# Patient Record
Sex: Male | Born: 1955 | Race: White | Hispanic: No | Marital: Married | Smoking: Former smoker
Health system: Southern US, Community
[De-identification: ages and names within clinical notes are randomized; demographics above are authoritative.]

## PROBLEM LIST (undated history)

## (undated) DIAGNOSIS — E669 Obesity, unspecified: Secondary | ICD-10-CM

## (undated) DIAGNOSIS — R32 Unspecified urinary incontinence: Secondary | ICD-10-CM

## (undated) DIAGNOSIS — F329 Major depressive disorder, single episode, unspecified: Secondary | ICD-10-CM

## (undated) DIAGNOSIS — R1310 Dysphagia, unspecified: Secondary | ICD-10-CM

## (undated) DIAGNOSIS — F419 Anxiety disorder, unspecified: Secondary | ICD-10-CM

## (undated) DIAGNOSIS — K5790 Diverticulosis of intestine, part unspecified, without perforation or abscess without bleeding: Secondary | ICD-10-CM

## (undated) DIAGNOSIS — I10 Essential (primary) hypertension: Secondary | ICD-10-CM

## (undated) DIAGNOSIS — K2270 Barrett's esophagus without dysplasia: Secondary | ICD-10-CM

## (undated) DIAGNOSIS — R079 Chest pain, unspecified: Secondary | ICD-10-CM

## (undated) DIAGNOSIS — E079 Disorder of thyroid, unspecified: Secondary | ICD-10-CM

## (undated) DIAGNOSIS — G2571 Drug induced akathisia: Secondary | ICD-10-CM

## (undated) DIAGNOSIS — M180 Bilateral primary osteoarthritis of first carpometacarpal joints: Secondary | ICD-10-CM

## (undated) DIAGNOSIS — T7840XA Allergy, unspecified, initial encounter: Secondary | ICD-10-CM

## (undated) DIAGNOSIS — D649 Anemia, unspecified: Secondary | ICD-10-CM

## (undated) DIAGNOSIS — K635 Polyp of colon: Secondary | ICD-10-CM

## (undated) DIAGNOSIS — F32A Depression, unspecified: Secondary | ICD-10-CM

## (undated) DIAGNOSIS — N3281 Overactive bladder: Secondary | ICD-10-CM

## (undated) DIAGNOSIS — R609 Edema, unspecified: Secondary | ICD-10-CM

## (undated) DIAGNOSIS — E041 Nontoxic single thyroid nodule: Secondary | ICD-10-CM

## (undated) DIAGNOSIS — K449 Diaphragmatic hernia without obstruction or gangrene: Secondary | ICD-10-CM

## (undated) DIAGNOSIS — G2401 Drug induced subacute dyskinesia: Secondary | ICD-10-CM

## (undated) DIAGNOSIS — I872 Venous insufficiency (chronic) (peripheral): Secondary | ICD-10-CM

## (undated) DIAGNOSIS — F988 Other specified behavioral and emotional disorders with onset usually occurring in childhood and adolescence: Secondary | ICD-10-CM

## (undated) DIAGNOSIS — N280 Ischemia and infarction of kidney: Secondary | ICD-10-CM

## (undated) DIAGNOSIS — K219 Gastro-esophageal reflux disease without esophagitis: Secondary | ICD-10-CM

## (undated) HISTORY — DX: Unspecified urinary incontinence: R32

## (undated) HISTORY — DX: Diverticulosis of intestine, part unspecified, without perforation or abscess without bleeding: K57.90

## (undated) HISTORY — DX: Polyp of colon: K63.5

## (undated) HISTORY — DX: Edema, unspecified: R60.9

## (undated) HISTORY — DX: Other specified behavioral and emotional disorders with onset usually occurring in childhood and adolescence: F98.8

## (undated) HISTORY — DX: Overactive bladder: N32.81

## (undated) HISTORY — DX: Barrett's esophagus without dysplasia: K22.70

## (undated) HISTORY — DX: Disorder of thyroid, unspecified: E07.9

## (undated) HISTORY — DX: Dysphagia, unspecified: R13.10

## (undated) HISTORY — DX: Bilateral primary osteoarthritis of first carpometacarpal joints: M18.0

## (undated) HISTORY — DX: Gastro-esophageal reflux disease without esophagitis: K21.9

## (undated) HISTORY — DX: Drug induced akathisia: G25.71

## (undated) HISTORY — DX: Anemia, unspecified: D64.9

## (undated) HISTORY — DX: Obesity, unspecified: E66.9

## (undated) HISTORY — DX: Anxiety disorder, unspecified: F41.9

## (undated) HISTORY — DX: Drug induced subacute dyskinesia: G24.01

## (undated) HISTORY — DX: Chest pain, unspecified: R07.9

## (undated) HISTORY — DX: Depression, unspecified: F32.A

## (undated) HISTORY — DX: Allergy, unspecified, initial encounter: T78.40XA

## (undated) HISTORY — DX: Major depressive disorder, single episode, unspecified: F32.9

## (undated) HISTORY — DX: Diaphragmatic hernia without obstruction or gangrene: K44.9

## (undated) HISTORY — DX: Venous insufficiency (chronic) (peripheral): I87.2

## (undated) HISTORY — DX: Nontoxic single thyroid nodule: E04.1

## (undated) HISTORY — DX: Ischemia and infarction of kidney: N28.0

---
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
## 1980-06-14 HISTORY — PX: INGUINAL HERNIA REPAIR: SUR1180

## 1996-06-14 HISTORY — PX: NERVE AND TENDON REPAIR: SHX5693

## 1997-06-14 HISTORY — PX: LUMBAR DISC SURGERY: SHX700

## 1998-06-04 ENCOUNTER — Inpatient Hospital Stay (HOSPITAL_COMMUNITY): Admission: RE | Admit: 1998-06-04 | Discharge: 1998-06-06 | Payer: Self-pay | Admitting: Neurosurgery

## 1998-06-04 ENCOUNTER — Encounter: Payer: Self-pay | Admitting: Neurosurgery

## 1998-07-02 ENCOUNTER — Encounter: Payer: Self-pay | Admitting: Neurosurgery

## 1998-07-02 ENCOUNTER — Ambulatory Visit (HOSPITAL_COMMUNITY): Admission: RE | Admit: 1998-07-02 | Discharge: 1998-07-02 | Payer: Self-pay | Admitting: Neurosurgery

## 1998-10-03 ENCOUNTER — Ambulatory Visit (HOSPITAL_COMMUNITY): Admission: RE | Admit: 1998-10-03 | Discharge: 1998-10-03 | Payer: Self-pay | Admitting: Neurosurgery

## 1998-10-03 ENCOUNTER — Encounter: Payer: Self-pay | Admitting: Neurosurgery

## 2000-06-14 HISTORY — PX: LAPAROSCOPIC CHOLECYSTECTOMY: SUR755

## 2013-09-18 ENCOUNTER — Ambulatory Visit: Payer: Self-pay | Admitting: Cardiovascular Disease

## 2016-06-09 ENCOUNTER — Encounter: Payer: Self-pay | Admitting: Emergency Medicine

## 2016-06-09 ENCOUNTER — Emergency Department
Admission: EM | Admit: 2016-06-09 | Discharge: 2016-06-09 | Disposition: A | Discharge status: Home / Self Care | Payer: Medicaid Other | Attending: Emergency Medicine | Admitting: Emergency Medicine

## 2016-06-09 ENCOUNTER — Emergency Department: Payer: Medicaid Other

## 2016-06-09 DIAGNOSIS — F17200 Nicotine dependence, unspecified, uncomplicated: Secondary | ICD-10-CM | POA: Insufficient documentation

## 2016-06-09 DIAGNOSIS — W01190A Fall on same level from slipping, tripping and stumbling with subsequent striking against furniture, initial encounter: Secondary | ICD-10-CM | POA: Diagnosis not present

## 2016-06-09 DIAGNOSIS — Y999 Unspecified external cause status: Secondary | ICD-10-CM | POA: Diagnosis not present

## 2016-06-09 DIAGNOSIS — I10 Essential (primary) hypertension: Secondary | ICD-10-CM | POA: Diagnosis not present

## 2016-06-09 DIAGNOSIS — S8012XA Contusion of left lower leg, initial encounter: Secondary | ICD-10-CM | POA: Diagnosis not present

## 2016-06-09 DIAGNOSIS — Y92009 Unspecified place in unspecified non-institutional (private) residence as the place of occurrence of the external cause: Secondary | ICD-10-CM | POA: Diagnosis not present

## 2016-06-09 DIAGNOSIS — Y939 Activity, unspecified: Secondary | ICD-10-CM | POA: Diagnosis not present

## 2016-06-09 DIAGNOSIS — Z79899 Other long term (current) drug therapy: Secondary | ICD-10-CM | POA: Insufficient documentation

## 2016-06-09 DIAGNOSIS — T148XXA Other injury of unspecified body region, initial encounter: Secondary | ICD-10-CM

## 2016-06-09 DIAGNOSIS — Z7982 Long term (current) use of aspirin: Secondary | ICD-10-CM | POA: Insufficient documentation

## 2016-06-09 DIAGNOSIS — S8992XA Unspecified injury of left lower leg, initial encounter: Secondary | ICD-10-CM | POA: Diagnosis present

## 2016-06-09 HISTORY — DX: Essential (primary) hypertension: I10

## 2016-06-09 MED ORDER — OXYCODONE-ACETAMINOPHEN 5-325 MG PO TABS
1.0000 | ORAL_TABLET | Freq: Once | ORAL | Status: AC
  Administered 2016-06-09: 1 via ORAL
  Filled 2016-06-09: qty 1

## 2016-06-09 MED ORDER — BACITRACIN ZINC 500 UNIT/GM EX OINT
TOPICAL_OINTMENT | Freq: Once | CUTANEOUS | Status: AC
  Administered 2016-06-09: 1 via TOPICAL
  Filled 2016-06-09: qty 0.9

## 2016-06-09 MED ORDER — NAPROXEN 500 MG PO TBEC: 500.0000 mg | DELAYED_RELEASE_TABLET | Freq: Two times a day (BID) | ORAL | 0 refills | Status: DC

## 2016-06-09 MED ORDER — HYDROCODONE-ACETAMINOPHEN 5-325 MG PO TABS: 1.0000 | ORAL_TABLET | Freq: Three times a day (TID) | ORAL | 0 refills | Status: DC | PRN

## 2016-06-09 NOTE — ED Provider Notes (Signed)
[facility] Emergency Department Provider Note ____________________________________________  Time seen: [id number]  I have reviewed the triage vital signs and the nursing notes.  HISTORY  Chief Complaint  Fall and Leg Injury  HPI Jared Carey is a 60 y.o. male resistance to the ED accompanied by his wife for evaluation of injury sustained following a fall at home. He describes slipping at home, and when he fell [place] hit the side of his coffee table. He sustained a small abrasion to the upper leg, since that time has had bruising, swelling, and tightness. He denies any knee injury, ankle injury, or head injury. He reports pain to the leg but denies any chest pain or distal paresthesias. He dose aspirin this morning for pain relief.   Past Medical History:  Diagnosis Date  . ADD (attention deficit disorder)   . Anemia   . Anxiety   . Depression   . Hyperlipidemia   . Hypertension   . Incontinence    urge  . Major depressive disorder   . Obesity   . Obesity   . Renal infarction (HCC)   . Venous insufficiency     There are no active problems to display for this patient.   Past Surgical History:  Procedure Laterality Date  . LAPAROSCOPIC CHOLECYSTECTOMY    . LUMBAR DISC SURGERY     L5-S1     Prior to Admission medications   Medication Sig Start Date End Date Taking? Authorizing Provider  amLODipine (NORVASC) 10 MG tablet Take 10 mg by mouth daily.   Yes Historical Provider, MD  aspirin 325 MG tablet Take 975 mg by mouth every 6 (six) hours as needed.   Yes Historical Provider, MD  pantoprazole (PROTONIX) 40 MG tablet Take 40 mg by mouth daily.   Yes Historical Provider, MD  albuterol (PROVENTIL HFA;VENTOLIN HFA) 108 (90 BASE) MCG/ACT inhaler Inhale 2 puffs into the lungs every 6 (six) hours as needed for wheezing or shortness of breath.    Historical Provider, MD  fluticasone (FLOVENT DISKUS) 50 MCG/BLIST diskus inhaler Inhale 1 puff into the lungs  daily.    Historical Provider, MD  methylphenidate (METADATE ER) 20 MG ER tablet Take 20 mg by mouth daily.    Historical Provider, MD  oxybutynin (DITROPAN-XL) 10 MG 24 hr tablet Take 20 mg by mouth 2 (two) times daily.    Historical Provider, MD  pravastatin (PRAVACHOL) 80 MG tablet Take 80 mg by mouth daily.    Historical Provider, MD  propranolol (INDERAL) 40 MG tablet Take 40 mg by mouth 2 (two) times daily.    Historical Provider, MD  Venlafaxine HCl 225 MG TB24 Take 225 mg by mouth daily.    Historical Provider, MD  venlafaxine XR (EFFEXOR-XR) 150 MG 24 hr capsule Take 150 mg by mouth daily with breakfast.    Historical Provider, MD    Allergies Benadryl [diphenhydramine hcl (sleep)] and Simvastatin  No family history on file.  Social History Social History  Substance Use Topics  . Smoking status: Current Every Day Smoker  . Smokeless tobacco: Never Used  . Alcohol use No    Review of Systems  Constitutional: Negative for fever. Cardiovascular: Negative for chest pain. Respiratory: Negative for shortness of breath. Gastrointestinal: Negative for abdominal pain, vomiting and diarrhea. Musculoskeletal: Negative for back pain. LLE pain & swelling as above Skin: Negative for rash. Neurological: Negative for headaches, focal weakness or numbness. ____________________________________________  PHYSICAL EXAM:  VITAL SIGNS: ED Triage Vitals  Enc Vitals Group     BP 06/09/16 1324 (!) 150/96     Pulse Rate 06/09/16 1324 67     Resp 06/09/16 1324 18     Temp 06/09/16 1324 97.8 F (36.6 C)     Temp Source 06/09/16 1324 Oral     SpO2 06/09/16 1324 98 %     Weight 06/09/16 1324 300 lb (136.1 kg)     Height 06/09/16 1324 6\' 2"  (1.88 m)     Head Circumference --      Peak Flow --      Pain Score 06/09/16 1330 8     Pain Loc --      Pain Edu? --      Excl. in GC? --    Constitutional: Alert and oriented. Well appearing and in no distress. Head: Normocephalic and  atraumatic. Cardiovascular: Normal rate, regular rhythm. Normal distal pulses. Respiratory: Normal respiratory effort. No wheezes/rales/rhonchi. Musculoskeletal: LLE with local contusion, swelling and a moderate hematoma noted. Left calf measures 45 cm in diameter. Normal ankle and knee exam. Nontender with normal range of motion in all extremities.  Neurologic:  Antalgic gait without ataxia. Normal speech and language. No gross focal neurologic deficits are appreciated. Skin:  Skin is warm, dry and intact. No rash noted. Small Abrasion with serous weeping noted to the lateral left calf. Early ecchymosis noted to the left calf.  ____________________________________________   RADIOLOGY  Left Tibia/Fibula  Negative  I, Krisi Azua, Charlesetta IvoryJenise V Bacon, personally viewed and evaluated these images (plain radiographs) as part of my medical decision making, as well as reviewing the written report by the radiologist. ____________________________________________  PROCEDURES  Percocet 5-325 mg PO Bacitracin ointment Dressing ____________________________________________  INITIAL IMPRESSION / ASSESSMENT AND PLAN / ED COURSE  Patient with a mechanical fall resulting in contusion to the left lateral calf. He sustained a moderate hematoma and superficial abrasion. Radiology is negative for any acute fracture or dislocation of the lower leg. Patient is given instructions on management of a hematoma. The superficial wound is cleansed and dressed with antibiotic ointment and nonstick dressing. Patient will continue to rest, ice, elevate the lower leg. He should follow his primary care provider for ongoing symptom management.  Clinical Course    ____________________________________________  FINAL CLINICAL IMPRESSION(S) / ED DIAGNOSES  Final diagnoses:  Hematoma and contusion  Contusion of left leg, initial encounter      Lissa HoardJenise V Bacon Audon Heymann, PA-C 06/09/16 1551    Charlesetta IvoryJenise V Bacon [place] Paris[place]enshew,  PA-C 06/09/16 1613    Minna AntisKevin Paduchowski, MD 06/09/16 2022

## 2016-06-09 NOTE — ED Triage Notes (Signed)
States he tripped   Hit left lower leg on coffee table  Bruising and swelling noted to lower leg

## 2016-06-09 NOTE — Discharge Instructions (Signed)
Your exam and x-ray are negative for fracture or dislocation of the lower leg, but you do have a large hematoma and bruising. There is no indication on exam of a blood clot to the lower leg. You will experience muscle pain and tightness for a few days. Rest with the leg elevated and apply ice packs to reduce pain and swelling. Keep the wound clean, dry, and covered with antibiotic ointment. Take the pain medicine and anti-inflammatory as directed.

## 2016-06-14 HISTORY — PX: BIOPSY THYROID: PRO38

## 2016-08-06 ENCOUNTER — Encounter: Payer: Self-pay | Admitting: Emergency Medicine

## 2016-08-06 ENCOUNTER — Emergency Department: Payer: Medicaid Other

## 2016-08-06 ENCOUNTER — Observation Stay
Admission: EM | Admit: 2016-08-06 | Discharge: 2016-08-09 | Disposition: A | Discharge status: Home / Self Care | Payer: Medicaid Other | Attending: Internal Medicine | Admitting: Internal Medicine

## 2016-08-06 DIAGNOSIS — Z7982 Long term (current) use of aspirin: Secondary | ICD-10-CM | POA: Diagnosis not present

## 2016-08-06 DIAGNOSIS — F419 Anxiety disorder, unspecified: Secondary | ICD-10-CM | POA: Diagnosis not present

## 2016-08-06 DIAGNOSIS — K760 Fatty (change of) liver, not elsewhere classified: Secondary | ICD-10-CM | POA: Insufficient documentation

## 2016-08-06 DIAGNOSIS — I119 Hypertensive heart disease without heart failure: Secondary | ICD-10-CM | POA: Diagnosis not present

## 2016-08-06 DIAGNOSIS — I2510 Atherosclerotic heart disease of native coronary artery without angina pectoris: Secondary | ICD-10-CM | POA: Diagnosis not present

## 2016-08-06 DIAGNOSIS — Z79899 Other long term (current) drug therapy: Secondary | ICD-10-CM | POA: Diagnosis not present

## 2016-08-06 DIAGNOSIS — F988 Other specified behavioral and emotional disorders with onset usually occurring in childhood and adolescence: Secondary | ICD-10-CM | POA: Insufficient documentation

## 2016-08-06 DIAGNOSIS — N3941 Urge incontinence: Secondary | ICD-10-CM | POA: Diagnosis not present

## 2016-08-06 DIAGNOSIS — K56609 Unspecified intestinal obstruction, unspecified as to partial versus complete obstruction: Secondary | ICD-10-CM

## 2016-08-06 DIAGNOSIS — K56600 Partial intestinal obstruction, unspecified as to cause: Secondary | ICD-10-CM | POA: Insufficient documentation

## 2016-08-06 DIAGNOSIS — I10 Essential (primary) hypertension: Secondary | ICD-10-CM | POA: Diagnosis present

## 2016-08-06 DIAGNOSIS — Z6838 Body mass index (BMI) 38.0-38.9, adult: Secondary | ICD-10-CM | POA: Diagnosis not present

## 2016-08-06 DIAGNOSIS — E669 Obesity, unspecified: Secondary | ICD-10-CM | POA: Insufficient documentation

## 2016-08-06 DIAGNOSIS — Z9049 Acquired absence of other specified parts of digestive tract: Secondary | ICD-10-CM | POA: Insufficient documentation

## 2016-08-06 DIAGNOSIS — E785 Hyperlipidemia, unspecified: Secondary | ICD-10-CM | POA: Insufficient documentation

## 2016-08-06 DIAGNOSIS — I081 Rheumatic disorders of both mitral and tricuspid valves: Secondary | ICD-10-CM | POA: Insufficient documentation

## 2016-08-06 DIAGNOSIS — K2100 Gastro-esophageal reflux disease with esophagitis, without bleeding: Secondary | ICD-10-CM | POA: Diagnosis present

## 2016-08-06 DIAGNOSIS — K2270 Barrett's esophagus without dysplasia: Secondary | ICD-10-CM | POA: Diagnosis not present

## 2016-08-06 DIAGNOSIS — R109 Unspecified abdominal pain: Secondary | ICD-10-CM

## 2016-08-06 DIAGNOSIS — Z888 Allergy status to other drugs, medicaments and biological substances status: Secondary | ICD-10-CM | POA: Insufficient documentation

## 2016-08-06 DIAGNOSIS — F32A Depression, unspecified: Secondary | ICD-10-CM | POA: Diagnosis present

## 2016-08-06 DIAGNOSIS — Z87891 Personal history of nicotine dependence: Secondary | ICD-10-CM | POA: Insufficient documentation

## 2016-08-06 DIAGNOSIS — E041 Nontoxic single thyroid nodule: Secondary | ICD-10-CM | POA: Insufficient documentation

## 2016-08-06 DIAGNOSIS — K439 Ventral hernia without obstruction or gangrene: Secondary | ICD-10-CM | POA: Insufficient documentation

## 2016-08-06 DIAGNOSIS — D649 Anemia, unspecified: Secondary | ICD-10-CM | POA: Insufficient documentation

## 2016-08-06 DIAGNOSIS — K5730 Diverticulosis of large intestine without perforation or abscess without bleeding: Secondary | ICD-10-CM | POA: Diagnosis not present

## 2016-08-06 DIAGNOSIS — K219 Gastro-esophageal reflux disease without esophagitis: Secondary | ICD-10-CM | POA: Diagnosis not present

## 2016-08-06 DIAGNOSIS — F329 Major depressive disorder, single episode, unspecified: Secondary | ICD-10-CM | POA: Diagnosis not present

## 2016-08-06 DIAGNOSIS — R079 Chest pain, unspecified: Secondary | ICD-10-CM | POA: Diagnosis not present

## 2016-08-06 DIAGNOSIS — I872 Venous insufficiency (chronic) (peripheral): Secondary | ICD-10-CM | POA: Insufficient documentation

## 2016-08-06 LAB — CBC
HEMATOCRIT: 46.4 % (ref 40.0–52.0)
HEMOGLOBIN: 15.7 g/dL (ref 13.0–18.0)
MCH: 29.3 pg (ref 26.0–34.0)
MCHC: 33.9 g/dL (ref 32.0–36.0)
MCV: 86.6 fL (ref 80.0–100.0)
PLATELETS: 316 10*3/uL (ref 150–440)
RBC: 5.37 MIL/uL (ref 4.40–5.90)
RDW: 13.7 % (ref 11.5–14.5)
WBC: 9 10*3/uL (ref 3.8–10.6)

## 2016-08-06 LAB — BASIC METABOLIC PANEL
ANION GAP: 10 (ref 5–15)
BUN: 27 mg/dL — ABNORMAL HIGH (ref 6–20)
CHLORIDE: 100 mmol/L — AB (ref 101–111)
CO2: 24 mmol/L (ref 22–32)
CREATININE: 1.49 mg/dL — AB (ref 0.61–1.24)
Calcium: 8.3 mg/dL — ABNORMAL LOW (ref 8.9–10.3)
GFR calc non Af Amer: 49 mL/min — ABNORMAL LOW (ref >60)
GFR, EST AFRICAN AMERICAN: 57 mL/min — AB (ref >60)
Glucose, Bld: 121 mg/dL — ABNORMAL HIGH (ref 65–99)
Potassium: 3.8 mmol/L (ref 3.5–5.1)
SODIUM: 134 mmol/L — AB (ref 135–145)

## 2016-08-06 LAB — HEPATIC FUNCTION PANEL
ALBUMIN: 3.9 g/dL (ref 3.5–5.0)
ALT: 14 U/L — AB (ref 17–63)
AST: 22 U/L (ref 15–41)
Alkaline Phosphatase: 66 U/L (ref 38–126)
BILIRUBIN TOTAL: 0.8 mg/dL (ref 0.3–1.2)
Bilirubin, Direct: 0.1 mg/dL (ref 0.1–0.5)
Indirect Bilirubin: 0.7 mg/dL (ref 0.3–0.9)
Total Protein: 7.5 g/dL (ref 6.5–8.1)

## 2016-08-06 LAB — TROPONIN I: Troponin I: <0.03 ng/mL (ref <0.03)

## 2016-08-06 MED ORDER — GI COCKTAIL ~~LOC~~
ORAL | Status: AC
  Administered 2016-08-06: 30 mL via ORAL
  Filled 2016-08-06: qty 30

## 2016-08-06 MED ORDER — GI COCKTAIL ~~LOC~~
30.0000 mL | Freq: Once | ORAL | Status: AC
  Administered 2016-08-06: 30 mL via ORAL

## 2016-08-06 NOTE — ED Provider Notes (Signed)
[facility] Emergency Department Provider Note    First MD Initiated Contact with Patient 08/06/16 2056     (approximate)  I have reviewed the triage vital signs and the nursing notes.   HISTORY  Chief Complaint Chest Pain    HPI AMEYA KUTZ is a 61 y.o. male who presents with chief complaint of midsternal crushing chest pain with radiation to his right shoulder and jaw that occurred around 7:30 PM while sitting for dinner. Patient became diaphoretic and short of breath. States that pain was waxing and waning between a 3 out of 1010 in severity. Last roughly 45 minutes. Was still having some aching chest discomfort while in the ER but it resolved when patient was going to a chest x-ray was ordered in triage. States that he and his wife had recently had the flu "bug". Today was his first day back eating and drinking. Did not have any nausea or vomiting with this episode. States he has a history of GERD and Barrett's esophagus but this feels different from episodes of that. Denies any lower extremity swelling. Denies any known history of heart disease. Had a cardiac catheter at the [place] roughly 28 years ago that was reportedly "clean". He does take a daily aspirin.   Past Medical History:  Diagnosis Date  . ADD (attention deficit disorder)   . Anemia   . Anxiety   . Depression   . Hyperlipidemia   . Hypertension   . Incontinence    urge  . Major depressive disorder   . Obesity   . Obesity   . Renal infarction (HCC)   . Venous insufficiency    History reviewed. No pertinent family history. Past Surgical History:  Procedure Laterality Date  . LAPAROSCOPIC CHOLECYSTECTOMY    . LUMBAR DISC SURGERY     L5-S1    There are no active problems to display for this patient.     Prior to Admission medications   Medication Sig Start Date End Date Taking? Authorizing Provider  albuterol (PROVENTIL HFA;VENTOLIN HFA) 108 (90 BASE) MCG/ACT inhaler Inhale 2  puffs into the lungs every 6 (six) hours as needed for wheezing or shortness of breath.    Historical Provider, MD  amLODipine (NORVASC) 10 MG tablet Take 10 mg by mouth daily.    Historical Provider, MD  aspirin 325 MG tablet Take 975 mg by mouth every 6 (six) hours as needed.    Historical Provider, MD  fluticasone (FLOVENT DISKUS) 50 MCG/BLIST diskus inhaler Inhale 1 puff into the lungs daily.    Historical Provider, MD  HYDROcodone-acetaminophen (NORCO) 5-325 MG tablet Take 1 tablet by mouth 3 (three) times daily as needed. 06/09/16   Jenise V Bacon Menshew, PA-C  methylphenidate (METADATE ER) 20 MG ER tablet Take 20 mg by mouth daily.    Historical Provider, MD  naproxen (EC NAPROSYN) 500 MG EC tablet Take 1 tablet (500 mg total) by mouth 2 (two) times daily with a meal. 06/09/16   Jenise V Bacon Menshew, PA-C  oxybutynin (DITROPAN-XL) 10 MG 24 hr tablet Take 20 mg by mouth 2 (two) times daily.    Historical Provider, MD  pantoprazole (PROTONIX) 40 MG tablet Take 40 mg by mouth daily.    Historical Provider, MD  pravastatin (PRAVACHOL) 80 MG tablet Take 80 mg by mouth daily.    Historical Provider, MD  propranolol (INDERAL) 40 MG tablet Take 40 mg by mouth 2 (two) times daily.    Historical Provider, MD  Venlafaxine HCl 225 MG TB24 Take 225 mg by mouth daily.    Historical Provider, MD  venlafaxine XR (EFFEXOR-XR) 150 MG 24 hr capsule Take 150 mg by mouth daily with breakfast.    Historical Provider, MD    Allergies Benadryl [diphenhydramine hcl (sleep)] and Simvastatin    Social History Social History  Substance Use Topics  . Smoking status: Former Games developermoker  . Smokeless tobacco: Never Used  . Alcohol use No    Review of Systems Patient denies headaches, rhinorrhea, blurry vision, numbness, shortness of breath, chest pain, edema, cough, abdominal pain, nausea, vomiting, diarrhea, dysuria, fevers, rashes or hallucinations unless otherwise stated above in  HPI. ____________________________________________   PHYSICAL EXAM:  VITAL SIGNS: Vitals:   08/06/16 2106 08/06/16 2130  BP: 121/76 129/86  Pulse: 81 78  Resp: 19 (!) 21  Temp:      Constitutional: Alert and oriented. Chronically ill appearing but in no acute distress. Eyes: Conjunctivae are normal. PERRL. EOMI. Head: Atraumatic. Nose: No congestion/rhinnorhea. Mouth/Throat: Mucous membranes are moist.  Oropharynx non-erythematous. Neck: No stridor. Painless ROM. No cervical spine tenderness to palpation Hematological/Lymphatic/Immunilogical: No cervical lymphadenopathy. Cardiovascular: Normal rate, regular rhythm. Grossly normal heart sounds.  Good peripheral circulation. Respiratory: Normal respiratory effort.  No retractions. Lungs CTAB. Gastrointestinal: Soft and nontender. No distention. No abdominal bruits. No CVA tenderness. Musculoskeletal: No lower extremity tenderness nor edema.  No joint effusions. Neurologic:  Normal speech and language. No gross focal neurologic deficits are appreciated. No gait instability. Skin:  Skin is warm, dry and intact. No rash noted. Psychiatric: Mood and affect are normal. Speech and behavior are normal.  ____________________________________________   LABS (all labs ordered are listed, but only abnormal results are displayed)  Results for orders placed or performed during the hospital encounter of 08/06/16 (from the past 24 hour(s))  Basic metabolic panel     Status: Abnormal   Collection Time: 08/06/16  8:42 PM  Result Value Ref Range   Sodium 134 (L) 135 - 145 mmol/L   Potassium 3.8 3.5 - 5.1 mmol/L   Chloride 100 (L) 101 - 111 mmol/L   CO2 24 22 - 32 mmol/L   Glucose, Bld 121 (H) 65 - 99 mg/dL   BUN 27 (H) 6 - 20 mg/dL   Creatinine, Ser 1.611.49 (H) 0.61 - 1.24 mg/dL   Calcium 8.3 (L) 8.9 - 10.3 mg/dL   GFR calc non Af Amer 49 (L) >60 mL/min   GFR calc Af Amer 57 (L) >60 mL/min   Anion gap 10 5 - 15  CBC     Status: None    Collection Time: 08/06/16  8:42 PM  Result Value Ref Range   WBC 9.0 3.8 - 10.6 K/uL   RBC 5.37 4.40 - 5.90 MIL/uL   Hemoglobin 15.7 13.0 - 18.0 g/dL   HCT 09.646.4 04.540.0 - 40.952.0 %   MCV 86.6 80.0 - 100.0 fL   MCH 29.3 26.0 - 34.0 pg   MCHC 33.9 32.0 - 36.0 g/dL   RDW 81.113.7 91.411.5 - 78.214.5 %   Platelets 316 150 - 440 K/uL  Troponin I     Status: None   Collection Time: 08/06/16  8:42 PM  Result Value Ref Range   Troponin I <0.03 <0.03 ng/mL   ____________________________________________  EKG My review and personal interpretation at Time: 20:36   Indication: chest pain  Rate: 80  Rhythm: sinus Axis: normal Other: non specific st changes, poor r wave progression ____________________________________________  RADIOLOGY  I  personally reviewed all radiographic images ordered to evaluate for the above acute complaints and reviewed radiology reports and findings.  These findings were personally discussed with the patient.  Please see medical record for radiology report.  ____________________________________________   PROCEDURES  Procedure(s) performed:  Procedures    Critical Care performed: no ____________________________________________   INITIAL IMPRESSION / ASSESSMENT AND PLAN / ED COURSE  Pertinent labs & imaging results that were available during my care of the patient were reviewed by me and considered in my medical decision making (see chart for details).  DDX: ACS, pericarditis, esophagitis, boerhaaves, pe, dissection, pna, bronchitis, costochondritis    BINH DOTEN is a 61 y.o. who presents to the ED with A concerning description of chest pain that occurred while at rest. Patient high risk for coronary disease but reportedly had negative catheter several years ago. He is otherwise afebrile hemodynamically stable. Currently chest pain-free. EKG shows nonspecific changes. Troponin is negative. Chest x-ray shows no evidence of heart failure or consolidation. No evidence  pneumothorax. Will add on LFTs to evaluate for any evidence of biliary obstruction as he was complaining of some epigastric discomfort and has a history of cholecystectomy.  Clinical Course as of Aug 07 2347  Fri Aug 06, 2016  2237 Blood work is otherwise unremarkable. Chronic kidney disease of uncertain extent.  Remains chest pain-free at this time but given his high risk history and concerning features for atypical chest pain do feel patient, I do feel patient would benefit for further evaluation in the hospital.  [PR]    Clinical Course User Index [PR] Willy Eddy, MD     ____________________________________________   FINAL CLINICAL IMPRESSION(S) / ED DIAGNOSES  Final diagnoses:  Chest pain, unspecified type      NEW MEDICATIONS STARTED DURING THIS VISIT:  New Prescriptions   No medications on file     Note:  This document was prepared using Dragon voice recognition software and may include unintentional dictation errors.    Willy Eddy, MD 08/06/16 2351

## 2016-08-06 NOTE — ED Triage Notes (Addendum)
Pt stated his CP started at 7pm tonight and "hit hard, all of a sudden"  Pt is SOB during triage.  Pt states his pain is intermittent and currently is in 2/10 pain but when the pain happens, is a 9/10.  Pt takes medication for HTN.  Pt. states his pain in in his central/right side and radiating through to his back.

## 2016-08-07 ENCOUNTER — Observation Stay
Admit: 2016-08-07 | Discharge: 2016-08-07 | Disposition: A | Discharge status: Home / Self Care | Payer: Medicaid Other | Attending: Internal Medicine | Admitting: Internal Medicine

## 2016-08-07 ENCOUNTER — Observation Stay: Payer: Medicaid Other

## 2016-08-07 ENCOUNTER — Encounter: Payer: Self-pay | Admitting: Internal Medicine

## 2016-08-07 DIAGNOSIS — F32A Depression, unspecified: Secondary | ICD-10-CM | POA: Diagnosis present

## 2016-08-07 DIAGNOSIS — F329 Major depressive disorder, single episode, unspecified: Secondary | ICD-10-CM | POA: Diagnosis present

## 2016-08-07 DIAGNOSIS — K2100 Gastro-esophageal reflux disease with esophagitis, without bleeding: Secondary | ICD-10-CM | POA: Diagnosis present

## 2016-08-07 DIAGNOSIS — R079 Chest pain, unspecified: Secondary | ICD-10-CM | POA: Diagnosis present

## 2016-08-07 DIAGNOSIS — I10 Essential (primary) hypertension: Secondary | ICD-10-CM | POA: Diagnosis present

## 2016-08-07 HISTORY — DX: Chest pain, unspecified: R07.9

## 2016-08-07 LAB — TROPONIN I: Troponin I: <0.03 ng/mL (ref <0.03)

## 2016-08-07 LAB — CBC
HCT: 43.4 % (ref 40.0–52.0)
HEMOGLOBIN: 14.7 g/dL (ref 13.0–18.0)
MCH: 29.2 pg (ref 26.0–34.0)
MCHC: 33.9 g/dL (ref 32.0–36.0)
MCV: 86 fL (ref 80.0–100.0)
Platelets: 283 10*3/uL (ref 150–440)
RBC: 5.05 MIL/uL (ref 4.40–5.90)
RDW: 13.6 % (ref 11.5–14.5)
WBC: 7.6 10*3/uL (ref 3.8–10.6)

## 2016-08-07 LAB — BASIC METABOLIC PANEL
Anion gap: 8 (ref 5–15)
BUN: 21 mg/dL — ABNORMAL HIGH (ref 6–20)
CALCIUM: 8 mg/dL — AB (ref 8.9–10.3)
CHLORIDE: 103 mmol/L (ref 101–111)
CO2: 25 mmol/L (ref 22–32)
CREATININE: 1.14 mg/dL (ref 0.61–1.24)
GFR calc Af Amer: >60 mL/min (ref >60)
GFR calc non Af Amer: >60 mL/min (ref >60)
GLUCOSE: 124 mg/dL — AB (ref 65–99)
Potassium: 4 mmol/L (ref 3.5–5.1)
Sodium: 136 mmol/L (ref 135–145)

## 2016-08-07 LAB — GLUCOSE, CAPILLARY: Glucose-Capillary: 128 mg/dL — ABNORMAL HIGH (ref 65–99)

## 2016-08-07 LAB — ECHOCARDIOGRAM COMPLETE
Height: 73 in
WEIGHTICAEL: 4702.4 [oz_av]

## 2016-08-07 MED ORDER — ASPIRIN 325 MG PO TABS
975.0000 mg | ORAL_TABLET | Freq: Four times a day (QID) | ORAL | Status: DC | PRN
  Administered 2016-08-07 – 2016-08-09 (×3): 975 mg via ORAL
  Filled 2016-08-07 (×3): qty 3

## 2016-08-07 MED ORDER — ACETAMINOPHEN 325 MG PO TABS: 650.0000 mg | ORAL_TABLET | Freq: Four times a day (QID) | ORAL | Status: DC | PRN

## 2016-08-07 MED ORDER — PANTOPRAZOLE SODIUM 40 MG PO TBEC
40.0000 mg | DELAYED_RELEASE_TABLET | Freq: Every day | ORAL | Status: DC
  Administered 2016-08-07 – 2016-08-09 (×3): 40 mg via ORAL
  Filled 2016-08-07 (×3): qty 1

## 2016-08-07 MED ORDER — IOPAMIDOL (ISOVUE-370) INJECTION 76%
80.0000 mL | Freq: Once | INTRAVENOUS | Status: AC | PRN
  Administered 2016-08-07: 80 mL via INTRAVENOUS

## 2016-08-07 MED ORDER — HYDROXYZINE HCL 25 MG PO TABS
25.0000 mg | ORAL_TABLET | Freq: Every evening | ORAL | Status: DC | PRN
  Administered 2016-08-07: 25 mg via ORAL
  Filled 2016-08-07: qty 1

## 2016-08-07 MED ORDER — AMLODIPINE BESYLATE 10 MG PO TABS
10.0000 mg | ORAL_TABLET | Freq: Every day | ORAL | Status: DC
  Administered 2016-08-07 – 2016-08-09 (×3): 10 mg via ORAL
  Filled 2016-08-07 (×3): qty 1

## 2016-08-07 MED ORDER — PROPRANOLOL HCL 20 MG PO TABS
40.0000 mg | ORAL_TABLET | Freq: Two times a day (BID) | ORAL | Status: DC
  Administered 2016-08-07 – 2016-08-09 (×6): 40 mg via ORAL
  Filled 2016-08-07 (×7): qty 2

## 2016-08-07 MED ORDER — SODIUM CHLORIDE 0.9 % IV BOLUS (SEPSIS)
500.0000 mL | Freq: Once | INTRAVENOUS | Status: AC
  Administered 2016-08-07: 500 mL via INTRAVENOUS

## 2016-08-07 MED ORDER — IOPAMIDOL (ISOVUE-300) INJECTION 61%
15.0000 mL | INTRAVENOUS | Status: AC
  Administered 2016-08-07 (×2): 15 mL via ORAL

## 2016-08-07 MED ORDER — VENLAFAXINE HCL ER 75 MG PO CP24
150.0000 mg | ORAL_CAPSULE | Freq: Every day | ORAL | Status: DC
  Administered 2016-08-07 – 2016-08-09 (×3): 150 mg via ORAL
  Filled 2016-08-07 (×3): qty 2

## 2016-08-07 MED ORDER — SODIUM CHLORIDE 0.9 % IV SOLN
INTRAVENOUS | Status: DC
  Administered 2016-08-07 (×3): via INTRAVENOUS

## 2016-08-07 MED ORDER — ONDANSETRON HCL 4 MG/2ML IJ SOLN
4.0000 mg | Freq: Four times a day (QID) | INTRAMUSCULAR | Status: DC | PRN
  Administered 2016-08-07 (×2): 4 mg via INTRAVENOUS
  Filled 2016-08-07 (×2): qty 2

## 2016-08-07 MED ORDER — ENOXAPARIN SODIUM 40 MG/0.4ML ~~LOC~~ SOLN
40.0000 mg | SUBCUTANEOUS | Status: DC
  Administered 2016-08-07 – 2016-08-08 (×2): 40 mg via SUBCUTANEOUS
  Filled 2016-08-07 (×2): qty 0.4

## 2016-08-07 MED ORDER — MORPHINE SULFATE (PF) 4 MG/ML IV SOLN
2.0000 mg | INTRAVENOUS | Status: DC | PRN
  Administered 2016-08-07 (×2): 4 mg via INTRAVENOUS
  Filled 2016-08-07 (×2): qty 1

## 2016-08-07 MED ORDER — ONDANSETRON HCL 4 MG PO TABS: 4.0000 mg | ORAL_TABLET | Freq: Four times a day (QID) | ORAL | Status: DC | PRN

## 2016-08-07 MED ORDER — ACETAMINOPHEN 650 MG RE SUPP: 650.0000 mg | Freq: Four times a day (QID) | RECTAL | Status: DC | PRN

## 2016-08-07 NOTE — Consult Note (Signed)
Reason for Consult partial small bowel obstruction  Referring Physician: Dr. Jinny Sanders MCKADE Jared Carey is an 61 y.o. male.  HPI: This is a 61 year old male who was admitted with a complaint of chest pain and some nausea and vomiting starting last couple of days. He was admitted overnight and subsequently a CT of the abdomen and pelvis was obtained suggesting a partial small bowel obstruction. The patient stated that the he had both diarrhea and vomiting and diarrhea has since stopped and he is really not vomited any today after admission. He has had a sizable ventral hernia near the umbilicus for which she sought attention at the [facility]. He was advised by them to lose some weight before they'll consider repair of this since it appeared to contain only fat.  Past Medical History:  Diagnosis Date  . ADD (attention deficit disorder)   . Anemia   . Anxiety   . Depression   . Hyperlipidemia   . Hypertension   . Incontinence    urge  . Major depressive disorder   . Obesity   . Obesity   . 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 Venous insufficiency     Past Surgical History:  Procedure Laterality Date  . LAPAROSCOPIC CHOLECYSTECTOMY    . LUMBAR DISC SURGERY     L5-S1     Family History  Problem Relation Age of Onset  . Hypertension Other     Social History:  reports that he has quit smoking. He has never used smokeless tobacco. He reports that he does not drink alcohol or use drugs.  Allergies:  Allergies  Allergen Reactions  . Benadryl [Diphenhydramine Hcl (Sleep)]   . Simvastatin     Medications: I have reviewed the patient's current medications.  Results for orders placed or performed during the hospital encounter of 08/06/16 (from the past 48 hour(s))  Basic metabolic panel     Status: Abnormal   Collection Time: 08/06/16  8:42 PM  Result Value Ref Range   Sodium 134 (L) 135 - 145 mmol/L   Potassium 3.8 3.5 - 5.1 mmol/L   Chloride 100 (L) 101 - 111 mmol/L   CO2 24 22 - 32 mmol/L     Glucose, Bld 121 (H) 65 - 99 mg/dL   BUN 27 (H) 6 - 20 mg/dL   Creatinine, Ser 1.49 (H) 0.61 - 1.24 mg/dL   Calcium 8.3 (L) 8.9 - 10.3 mg/dL   GFR calc non Af Amer 49 (L) >60 mL/min   GFR calc Af Amer 57 (L) >60 mL/min    Comment: (NOTE) The eGFR has been calculated using the CKD EPI equation. This calculation has not been validated in all clinical situations. eGFR's persistently <60 mL/min signify possible Chronic Kidney Disease.    Anion gap 10 5 - 15  CBC     Status: None   Collection Time: 08/06/16  8:42 PM  Result Value Ref Range   WBC 9.0 3.8 - 10.6 K/uL   RBC 5.37 4.40 - 5.90 MIL/uL   Hemoglobin 15.7 13.0 - 18.0 g/dL   HCT 46.4 40.0 - 52.0 %   MCV 86.6 80.0 - 100.0 fL   MCH 29.3 26.0 - 34.0 pg   MCHC 33.9 32.0 - 36.0 g/dL   RDW 13.7 11.5 - 14.5 %   Platelets 316 150 - 440 K/uL  Troponin I     Status: None   Collection Time: 08/06/16  8:42 PM  Result Value Ref Range   Troponin I <0.03 <  0.03 ng/mL  Hepatic function panel     Status: Abnormal   Collection Time: 08/06/16  8:42 PM  Result Value Ref Range   Total Protein 7.5 6.5 - 8.1 g/dL   Albumin 3.9 3.5 - 5.0 g/dL   AST 22 15 - 41 U/L   ALT 14 (L) 17 - 63 U/L   Alkaline Phosphatase 66 38 - 126 U/L   Total Bilirubin 0.8 0.3 - 1.2 mg/dL   Bilirubin, Direct 0.1 0.1 - 0.5 mg/dL   Indirect Bilirubin 0.7 0.3 - 0.9 mg/dL  Troponin I     Status: None   Collection Time: 08/07/16  1:47 AM  Result Value Ref Range   Troponin I <0.03 <0.03 ng/mL  Glucose, capillary     Status: Abnormal   Collection Time: 08/07/16  3:24 AM  Result Value Ref Range   Glucose-Capillary 128 (H) 65 - 99 mg/dL  Troponin I     Status: None   Collection Time: 08/07/16  7:24 AM  Result Value Ref Range   Troponin I <0.03 <0.03 ng/mL  Basic metabolic panel     Status: Abnormal   Collection Time: 08/07/16  7:24 AM  Result Value Ref Range   Sodium 136 135 - 145 mmol/L   Potassium 4.0 3.5 - 5.1 mmol/L   Chloride 103 101 - 111 mmol/L   CO2 25 22  - 32 mmol/L   Glucose, Bld 124 (H) 65 - 99 mg/dL   BUN 21 (H) 6 - 20 mg/dL   Creatinine, Ser 1.14 0.61 - 1.24 mg/dL   Calcium 8.0 (L) 8.9 - 10.3 mg/dL   GFR calc non Af Amer >60 >60 mL/min   GFR calc Af Amer >60 >60 mL/min    Comment: (NOTE) The eGFR has been calculated using the CKD EPI equation. This calculation has not been validated in all clinical situations. eGFR's persistently <60 mL/min signify possible Chronic Kidney Disease.    Anion gap 8 5 - 15  CBC     Status: None   Collection Time: 08/07/16  7:24 AM  Result Value Ref Range   WBC 7.6 3.8 - 10.6 K/uL   RBC 5.05 4.40 - 5.90 MIL/uL   Hemoglobin 14.7 13.0 - 18.0 g/dL   HCT 43.4 40.0 - 52.0 %   MCV 86.0 80.0 - 100.0 fL   MCH 29.2 26.0 - 34.0 pg   MCHC 33.9 32.0 - 36.0 g/dL   RDW 13.6 11.5 - 14.5 %   Platelets 283 150 - 440 K/uL  Troponin I     Status: None   Collection Time: 08/07/16  1:27 PM  Result Value Ref Range   Troponin I <0.03 <0.03 ng/mL    Ct Abdomen Pelvis Wo Contrast  Result Date: 08/07/2016 CLINICAL DATA:  61 year old male with abdominal and pelvic pain for 2 days. EXAM: CT ABDOMEN AND PELVIS WITHOUT CONTRAST TECHNIQUE: Multidetector CT imaging of the abdomen and pelvis was performed following the standard protocol without IV contrast. Intravenous contrast was not administered as the patient received contrast on a her earlier chest CT today. COMPARISON:  None. FINDINGS: Please note that parenchymal abnormalities may be missed without intravenous contrast. Lower chest: No acute abnormality.  Mild cardiomegaly noted. Hepatobiliary: The liver is unremarkable. The patient is status post cholecystectomy. There is no evidence of biliary dilatation. Pancreas: Unremarkable Spleen: Unremarkable Adrenals/Urinary Tract: The right kidney is atrophic. There is no evidence of hydronephrosis. A probable right renal cyst is present. The adrenal glands and bladder are  unremarkable. Stomach/Bowel: There are several dilated  proximal and mid small bowel loops with collapsed distal small bowel loops. No definite transition point is noted but there is some stool in the small bowel of the lower left abdomen at were there is a gradual transition zone. There is no evidence of pneumatosis intestinalis or pneumoperitoneum. Colonic diverticulosis noted without diverticulitis. Gas and fluid within the colon is noted. No definite bowel wall thickening is present. The appendix is normal. Vascular/Lymphatic: There is no evidence of abdominal aortic aneurysm. No enlarged lymph nodes are identified. Reproductive: The prostate is within normal limits. Other: No free fluid or abscess. Two small to moderate paraumbilical hernias containing only fat are noted. Musculoskeletal: No acute or suspicious abnormality identified. Fusion changes at L5-S1 noted. IMPRESSION: Dilated proximal and mid small bowel loops with collapsed distal small bowel loops identified. No definite abrupt transition point but this pattern suggests a partial small bowel obstruction. No evidence of pneumoperitoneum, free fluid or abscess. Two small to moderate paraumbilical hernias containing only fat. Mild cardiomegaly. Colonic diverticulosis. Electronically Signed   By: Margarette Canada M.D.   On: 08/07/2016 10:40   Dg Chest 2 View  Result Date: 08/06/2016 CLINICAL DATA:  61 y/o  M; chest pain. EXAM: CHEST  2 VIEW COMPARISON:  None. FINDINGS: The heart size and mediastinal contours are within normal limits. Both lungs are clear. The visualized skeletal structures are unremarkable. IMPRESSION: No active cardiopulmonary disease. Electronically Signed   By: Kristine Garbe M.D.   On: 08/06/2016 21:33   Ct Angio Chest Aorta W/cm &/or Wo/cm  Result Date: 08/07/2016 CLINICAL DATA:  61 year old male with chest pain radiating to the back. EXAM: CT ANGIOGRAPHY CHEST WITH CONTRAST TECHNIQUE: Multidetector CT imaging of the chest was performed using the standard protocol during  bolus administration of intravenous contrast. Multiplanar CT image reconstructions and MIPs were obtained to evaluate the vascular anatomy. CONTRAST:  80 cc Isovue 370 COMPARISON:  Chest radiograph 08/06/2016 FINDINGS: Cardiovascular: There is no cardiomegaly or pericardial effusion. The thoracic aorta appears unremarkable. No aneurysmal dilatation or evidence of dissection. The origins of the great vessels of the aortic arch appear patent. The central pulmonary arteries are grossly unremarkable. Evaluation of the pulmonary arteries is limited as this study was performed in arterial phase imaging. Mediastinum/Nodes: There is no hilar or mediastinal adenopathy. Esophagus is grossly unremarkable. There is a 3 cm left thyroid gland nodule. Further evaluation with thyroid ultrasound recommended. Lungs/Pleura: An area of minimal atelectasis/ scarring noted in the right lower lobe. There is no focal consolidation, pleural effusion, or pneumothorax. The central airways are patent. Upper Abdomen: Cholecystectomy. There is apparent fatty infiltration of the liver. Right renal atrophy. Multiple dilated loops of small bowel noted in the upper abdomen measuring up to 5.3 cm. Most consistent with small bowel obstruction. Evaluation of the bowel is however limited due to partial visualization. Correlation with clinical exam recommended. CT of the abdomen pelvis recommended for further evaluation. Musculoskeletal: No chest wall abnormality. No acute or significant osseous findings. Review of the MIP images confirms the above findings. IMPRESSION: 1. No acute intrathoracic pathology. No CT evidence of aortic dissection or aneurysm. 2. Partially visualized dilated loops of small bowel most suggestive of small-bowel obstruction. CT of the abdomen pelvis recommended for further evaluation. 3. **An incidental finding of potential clinical significance has been found. A 3 cm left thyroid nodule. Ultrasound is recommended for further  evaluation.** 4. Fatty liver. 5. Atrophic right kidney. Electronically Signed   By:  Anner Crete M.D.   On: 08/07/2016 05:17    Review of Systems  Constitutional: Negative for chills, fever, malaise/fatigue and weight loss.  Respiratory: Negative.   Cardiovascular: Positive for chest pain. Negative for palpitations and orthopnea.  Gastrointestinal: Positive for diarrhea, nausea and vomiting.   Blood pressure 127/78, pulse 70, temperature 98 F (36.7 C), temperature source Oral, resp. rate 18, height 6' 1"  (1.854 m), weight 293 lb 14.4 oz (133.3 kg), SpO2 95 %. Physical Exam  Constitutional: He is oriented to person, place, and time. He appears well-developed and well-nourished.  Eyes: Conjunctivae are normal. No scleral icterus.  Neck: Neck supple.  Cardiovascular: Normal rate, regular rhythm and normal heart sounds.   Respiratory: Effort normal and breath sounds normal.  GI: Soft. Bowel sounds are normal. There is tenderness (non focal). A hernia is present. Hernia confirmed positive in the ventral area.    Abdomen is very obese.  Lymphadenopathy:    He has no cervical adenopathy.  Neurological: He is alert and oriented to person, place, and time.  Skin: Skin is warm and dry.    Assessment/Plan: CT scan was reviewed which shows that there is some mildly dilated small bowel with what appears to be some sort of a transition zone on the left side nature of this is unclear he does have a sizable fat-containing hernia near the umbilicus which appears to be at least 4-5 cm in the vertical orientation fascial defect. WBC is normal, electrolytes show mild elevation of creatinine At present I would favor conservative approach for the findings on CT. Nothing by mouth and IV fluids and follow his clinical course.  Thank you for allowing me to evaluate and help in the care of this patient Christene Lye 08/07/2016, 5:52 PM

## 2016-08-07 NOTE — Progress Notes (Signed)
Nurse was notified by nurse tech that she had assisted patient to restroom and he was able to make it there with assistance but was profusely sweating, looked unwell and was too weak to make it back to bed. Patient's vital signs taken at this time- HR stable in 60-70's, BP 102/58, and pt afebrile. BG 128. Pt assisted back to bed by nursing staff, 500 ml fluid bolus given. RN at patient's bedside to monitor, per patient request wife was called and asked to return to hospital. Will continue to monitor.

## 2016-08-07 NOTE — Progress Notes (Signed)
*  PRELIMINARY RESULTS* Echocardiogram 2D Echocardiogram has been performed.  Garrel Ridgelikeshia S Jamilla Galli 08/07/2016, 11:56 AM

## 2016-08-07 NOTE — Progress Notes (Signed)
CTA/aortogram negative for dissection.  Ordered as patient became diaphoretic and ashen  while ambulating to bathroom after presenting with chest pain that radiates to back along with abdominal pain.  Admits to multiple episodes of N/V for last 2 days although none today.  Possible SBO. CT abdomen with oral contrast ordered.

## 2016-08-07 NOTE — Progress Notes (Signed)
Patient ID: Jared Carey, male   DOB: 1956/01/02, 61 y.o.   MRN: [id number] Pt seen in consult for small bowel obstruction. Pt with 2 day history of n/v and diarrhea.  He states diarrhea has stopped, no vomiting today. CT suggests a partial small bowel obstruction., no apparent cause. He has a sizeable fatty ventral hernia. He is quite obese and has been advised at [facility] to lose weight so he could have hernia repaired. No need for any immediate surgical intervention. NPO and IVs for now. Full note to be dictated.

## 2016-08-07 NOTE — ED Notes (Signed)
Pt requesting something to help him sleep.  Order obtained from Dr. Roxan Hockeyobinson, per EDP, hold until after hospitalist sees pt.  Pt informed and agreeable with plan

## 2016-08-07 NOTE — H&P (Signed)
[facility] [place] [facility]   PATIENT NAME: Jared CedarJames Travieso    MR#:  [id number]  DATE OF BIRTH:  01-14-1956  DATE OF ADMISSION:  08/06/2016  PRIMARY CARE PHYSICIAN: Nada BoozerHENDERSON, WENDY, MD   REQUESTING/REFERRING PHYSICIAN: Roxan Hockeyobinson, M.D.  CHIEF COMPLAINT:   Chief Complaint  Patient presents with  . Chest Pain    HISTORY OF PRESENT ILLNESS:  Jared Carey  is a 61 y.o. male who presents with Chest pain. Patient states that he developed some chest pain today, substernal with some radiation to his back. This pain had a waxing and waning course but was constant from the time it started in the evening until he got to the ED. He has known nonobstructive CAD as found during cardiac catheterization 2 years ago which showed 20% blockage in one vessel. Given his risk factors, hospitalists were called for admission and further evaluation.  PAST MEDICAL HISTORY:   Past Medical History:  Diagnosis Date  . ADD (attention deficit disorder)   . Anemia   . Anxiety   . Depression   . Hyperlipidemia   . Hypertension   . Incontinence    urge  . Major depressive disorder   . Obesity   . Obesity   . Renal infarction ([facility])   . Venous insufficiency     PAST SURGICAL HISTORY:   Past Surgical History:  Procedure Laterality Date  . LAPAROSCOPIC CHOLECYSTECTOMY    . LUMBAR DISC SURGERY     L5-S1     SOCIAL HISTORY:   Social History  Substance Use Topics  . Smoking status: Former Games developermoker  . Smokeless tobacco: Never Used  . Alcohol use No    FAMILY HISTORY:   Family History  Problem Relation Age of Onset  . Hypertension Other     DRUG ALLERGIES:   Allergies  Allergen Reactions  . Benadryl [Diphenhydramine Hcl (Sleep)]   . Simvastatin     MEDICATIONS AT HOME:   Prior to Admission medications   Medication Sig Start Date End Date Taking? Authorizing Provider  albuterol (PROVENTIL HFA;VENTOLIN HFA) 108 (90 BASE) MCG/ACT inhaler Inhale 2 puffs into  the lungs every 6 (six) hours as needed for wheezing or shortness of breath.   Yes Historical Provider, MD  amLODipine (NORVASC) 10 MG tablet Take 10 mg by mouth daily.   Yes Historical Provider, MD  aspirin 325 MG tablet Take 975 mg by mouth every 6 (six) hours as needed.   Yes Historical Provider, MD  fluticasone (FLONASE) 50 MCG/ACT nasal spray Place 1 spray into both nostrils daily.   Yes Historical Provider, MD  oxybutynin (DITROPAN-XL) 10 MG 24 hr tablet Take 20 mg by mouth 2 (two) times daily.   Yes Historical Provider, MD  pantoprazole (PROTONIX) 40 MG tablet Take 40 mg by mouth daily.   Yes Historical Provider, MD  propranolol (INDERAL) 40 MG tablet Take 40 mg by mouth 2 (two) times daily.   Yes Historical Provider, MD  venlafaxine XR (EFFEXOR-XR) 150 MG 24 hr capsule Take 150 mg by mouth daily with breakfast.   Yes Historical Provider, MD  HYDROcodone-acetaminophen (NORCO) 5-325 MG tablet Take 1 tablet by mouth 3 (three) times daily as needed. Patient not taking: Reported on 08/06/2016 06/09/16   Charlesetta IvoryJenise V Bacon Menshew, PA-C    REVIEW OF SYSTEMS:  Review of Systems  Constitutional: Negative for chills, fever, malaise/fatigue and weight loss.  HENT: Negative for ear pain, hearing loss and tinnitus.   Eyes: Negative for blurred  vision, double vision, pain and redness.  Respiratory: Negative for cough, hemoptysis and shortness of breath.   Cardiovascular: Positive for chest pain. Negative for palpitations, orthopnea and leg swelling.  Gastrointestinal: Negative for abdominal pain, constipation, diarrhea, nausea and vomiting.  Genitourinary: Negative for dysuria, frequency and hematuria.  Musculoskeletal: Negative for back pain, joint pain and neck pain.  Skin:       No acne, rash, or lesions  Neurological: Negative for dizziness, tremors, focal weakness and weakness.  Endo/Heme/Allergies: Negative for polydipsia. Does not bruise/bleed easily.  Psychiatric/Behavioral: Negative for  depression. The patient is not nervous/anxious and does not have insomnia.      VITAL SIGNS:   Vitals:   08/06/16 2231 08/06/16 2300 08/06/16 2330 08/07/16 0000  BP: 126/81 121/68 104/76 (!) 125/92  Pulse: 82 74 81 75  Resp: (!) 23 18 (!) 21 18  Temp:      TempSrc:      SpO2: 98% 91% 95% 96%  Weight:      Height:       Wt Readings from Last 3 Encounters:  08/06/16 136.1 kg (300 lb)  06/09/16 136.1 kg (300 lb)    PHYSICAL EXAMINATION:  Physical Exam  Vitals reviewed. Constitutional: He is oriented to person, place, and time. He appears well-developed and well-nourished. No distress.  HENT:  Head: Normocephalic and atraumatic.  Mouth/Throat: Oropharynx is clear and moist.  Eyes: Conjunctivae and EOM are normal. Pupils are equal, round, and reactive to light. No scleral icterus.  Neck: Normal range of motion. Neck supple. No JVD present. No thyromegaly present.  Cardiovascular: Normal rate, regular rhythm and intact distal pulses.  Exam reveals no gallop and no friction rub.   No murmur heard. Respiratory: Effort normal and breath sounds normal. No respiratory distress. He has no wheezes. He has no rales.  GI: Soft. Bowel sounds are normal. He exhibits no distension. There is no tenderness.  Musculoskeletal: Normal range of motion. He exhibits no edema.  No arthritis, no gout  Lymphadenopathy:    He has no cervical adenopathy.  Neurological: He is alert and oriented to person, place, and time. No cranial nerve deficit.  No dysarthria, no aphasia  Skin: Skin is warm and dry. No rash noted. No erythema.  Psychiatric: He has a normal mood and affect. His behavior is normal. Judgment and thought content normal.    LABORATORY PANEL:   CBC  Recent Labs Lab 08/06/16 2042  WBC 9.0  HGB 15.7  HCT 46.4  PLT 316   ------------------------------------------------------------------------------------------------------------------  Chemistries   Recent Labs Lab  08/06/16 2042  NA 134*  K 3.8  CL 100*  CO2 24  GLUCOSE 121*  BUN 27*  CREATININE 1.49*  CALCIUM 8.3*  AST 22  ALT 14*  ALKPHOS 66  BILITOT 0.8   ------------------------------------------------------------------------------------------------------------------  Cardiac Enzymes  Recent Labs Lab 08/06/16 2042  TROPONINI <0.03   ------------------------------------------------------------------------------------------------------------------  RADIOLOGY:  Dg Chest 2 View  Result Date: 08/06/2016 CLINICAL DATA:  61 y/o  M; chest pain. EXAM: CHEST  2 VIEW COMPARISON:  None. FINDINGS: The heart size and mediastinal contours are within normal limits. Both lungs are clear. The visualized skeletal structures are unremarkable. IMPRESSION: No active cardiopulmonary disease. Electronically Signed   By: Mitzi Hansen M.D.   On: 08/06/2016 21:33    EKG:   Orders placed or performed during the hospital encounter of 08/06/16  . EKG 12-Lead  . EKG 12-Lead  . ED EKG within 10 minutes  . ED  EKG within 10 minutes    IMPRESSION AND PLAN:  Principal Problem:   Chest pain - initial workup in the ED was largely negative. Chest pain has resolved at this point. We'll trend his cardiac enzymes tonight, get an echocardiogram and a cardiology consult in the morning. Active Problems:   HTN (hypertension) - stable, continue home meds   GERD (gastroesophageal reflux disease) - home dose PPI   Depression - home dose antidepressant  All the records are reviewed and case discussed with ED provider. Management plans discussed with the patient and/or family.  DVT PROPHYLAXIS: SubQ lovenox  GI PROPHYLAXIS: PPI  ADMISSION STATUS: Observation  CODE STATUS: Full Code Status History    This patient does not have a recorded code status. Please follow your organizational policy for patients in this situation.      TOTAL TIME TAKING CARE OF THIS PATIENT: 40 minutes.    Soua Lenk  FIELDING 08/07/2016, 12:33 AM  [facility]  Office  [phone]  CC: Primary care physician; Nada Boozer, MD

## 2016-08-07 NOTE — Progress Notes (Signed)
CT abdomen reveals partial small bowel obstruction,surgical consultation done but no surgical intervention at this time,remains NPO to rest bowel,iv fluids continue.

## 2016-08-07 NOTE — Progress Notes (Signed)
CT abd shows SBO vs Partial SBO  NPO. Bowel rest. NGT if vomiting.  Surgery consult  Abd xray in AM

## 2016-08-08 ENCOUNTER — Encounter: Payer: Self-pay | Admitting: Internal Medicine

## 2016-08-08 ENCOUNTER — Observation Stay: Payer: Medicaid Other

## 2016-08-08 MED ORDER — POLYETHYLENE GLYCOL 3350 17 G PO PACK
17.0000 g | PACK | Freq: Two times a day (BID) | ORAL | Status: DC
  Administered 2016-08-08 – 2016-08-09 (×2): 17 g via ORAL
  Filled 2016-08-08 (×3): qty 1

## 2016-08-08 MED ORDER — HYDROCODONE-ACETAMINOPHEN 5-325 MG PO TABS: 1.0000 | ORAL_TABLET | Freq: Four times a day (QID) | ORAL | Status: DC | PRN

## 2016-08-08 NOTE — Progress Notes (Signed)
[facility] - [place] at [facility]   PATIENT NAME: Jared Carey    MR#:  [id number]  DATE OF BIRTH:  July 15, 1955  SUBJECTIVE:  CHIEF COMPLAINT:   Chief Complaint  Patient presents with  . Chest Pain   No chest pain or abd pain today No vomiting  REVIEW OF SYSTEMS:    Review of Systems  Constitutional: Positive for malaise/fatigue. Negative for chills and fever.  HENT: Negative for sore throat.   Eyes: Negative for blurred vision, double vision and pain.  Respiratory: Negative for cough, hemoptysis, shortness of breath and wheezing.   Cardiovascular: Negative for chest pain, palpitations, orthopnea and leg swelling.  Gastrointestinal: Negative for abdominal pain, constipation, diarrhea, heartburn, nausea and vomiting.  Genitourinary: Negative for dysuria and hematuria.  Musculoskeletal: Negative for back pain and joint pain.  Skin: Negative for rash.  Neurological: Negative for sensory change, speech change, focal weakness and headaches.  Endo/Heme/Allergies: Does not bruise/bleed easily.  Psychiatric/Behavioral: Negative for depression. The patient is not nervous/anxious.     DRUG ALLERGIES:   Allergies  Allergen Reactions  . Benadryl [Diphenhydramine Hcl (Sleep)]   . Simvastatin     VITALS:  Blood pressure 116/65, pulse (!) 55, temperature 97.8 F (36.6 C), temperature source Oral, resp. rate 20, height 6\' 1"  (1.854 m), weight 133.3 kg (293 lb 14.4 oz), SpO2 99 %.  PHYSICAL EXAMINATION:   Physical Exam  GENERAL:  61 y.o.-year-old patient lying in the bed with no acute distress.  EYES: Pupils equal, round, reactive to light and accommodation. No scleral icterus. Extraocular muscles intact.  HEENT: Head atraumatic, normocephalic. Oropharynx and nasopharynx clear.  NECK:  Supple, no jugular venous distention. No thyroid enlargement, no tenderness.  LUNGS: Normal breath sounds bilaterally, no wheezing, rales, rhonchi. No use of accessory muscles of  respiration.  CARDIOVASCULAR: S1, S2 normal. No murmurs, rubs, or gallops.  ABDOMEN: Soft, nontender, nondistended. Bowel sounds present. No organomegaly or mass. Ventral hernia EXTREMITIES: No cyanosis, clubbing or edema b/l.    NEUROLOGIC: Cranial nerves II through XII are intact. No focal Motor or sensory deficits b/l.   PSYCHIATRIC: The patient is alert and oriented x 3.  SKIN: No obvious rash, lesion, or ulcer.   LABORATORY PANEL:   CBC  Recent Labs Lab 08/07/16 0724  WBC 7.6  HGB 14.7  HCT 43.4  PLT 283   ------------------------------------------------------------------------------------------------------------------ Chemistries   Recent Labs Lab 08/06/16 2042 08/07/16 0724  NA 134* 136  K 3.8 4.0  CL 100* 103  CO2 24 25  GLUCOSE 121* 124*  BUN 27* 21*  CREATININE 1.49* 1.14  CALCIUM 8.3* 8.0*  AST 22  --   ALT 14*  --   ALKPHOS 66  --   BILITOT 0.8  --    ------------------------------------------------------------------------------------------------------------------  Cardiac Enzymes  Recent Labs Lab 08/07/16 1327  TROPONINI <0.03   ------------------------------------------------------------------------------------------------------------------  RADIOLOGY:  Ct Abdomen Pelvis Wo Contrast  Result Date: 08/07/2016 CLINICAL DATA:  [age] year old male with abdominal and pelvic pain for 2 days. EXAM: CT ABDOMEN AND PELVIS WITHOUT CONTRAST TECHNIQUE: Multidetector CT imaging of the abdomen and pelvis was performed following the standard protocol without IV contrast. Intravenous contrast was not administered as the patient received contrast on a her earlier chest CT today. COMPARISON:  None. FINDINGS: Please note that parenchymal abnormalities may be missed without intravenous contrast. Lower chest: No acute abnormality.  Mild cardiomegaly noted. Hepatobiliary: The liver is unremarkable. The patient is status post cholecystectomy. There is no evidence of biliary  dilatation. Pancreas: Unremarkable Spleen: Unremarkable Adrenals/Urinary Tract: The right kidney is atrophic. There is no evidence of hydronephrosis. A probable right renal cyst is present. The adrenal glands and bladder are unremarkable. Stomach/Bowel: There are several dilated proximal and mid small bowel loops with collapsed distal small bowel loops. No definite transition point is noted but there is some stool in the small bowel of the lower left abdomen at were there is a gradual transition zone. There is no evidence of pneumatosis intestinalis or pneumoperitoneum. Colonic diverticulosis noted without diverticulitis. Gas and fluid within the colon is noted. No definite bowel wall thickening is present. The appendix is normal. Vascular/Lymphatic: There is no evidence of abdominal aortic aneurysm. No enlarged lymph nodes are identified. Reproductive: The prostate is within normal limits. Other: No free fluid or abscess. Two small to moderate paraumbilical hernias containing only fat are noted. Musculoskeletal: No acute or suspicious abnormality identified. Fusion changes at L5-S1 noted. IMPRESSION: Dilated proximal and mid small bowel loops with collapsed distal small bowel loops identified. No definite abrupt transition point but this pattern suggests a partial small bowel obstruction. No evidence of pneumoperitoneum, free fluid or abscess. Two small to moderate paraumbilical hernias containing only fat. Mild cardiomegaly. Colonic diverticulosis. Electronically Signed   By: Harmon Pier M.D.   On: 08/07/2016 10:40   Dg Chest 2 View  Result Date: 08/06/2016 CLINICAL DATA:  61 y/o  M; chest pain. EXAM: CHEST  2 VIEW COMPARISON:  None. FINDINGS: The heart size and mediastinal contours are within normal limits. Both lungs are clear. The visualized skeletal structures are unremarkable. IMPRESSION: No active cardiopulmonary disease. Electronically Signed   By: Mitzi Hansen M.D.   On: 08/06/2016 21:33    Dg Abd 2 Views  Result Date: 08/08/2016 CLINICAL DATA:  Small bowel obstruction. EXAM: ABDOMEN - 2 VIEW COMPARISON:  CT the abdomen pelvis 08/07/2016. FINDINGS: Gas and contrast within the colon are within normal limits. Residual dilated loops of small bowel with fluid levels are most evident in the right side of the abdomen. Surgical changes are again noted in the lumbar spine. IMPRESSION: 1. Improving small bowel obstruction with fluid levels in dilated bowel mostly on the right. 2. Contrast is seen within the colon.  The colon is unremarkable. Electronically Signed   By: Marin Roberts M.D.   On: 08/08/2016 10:25   Ct Angio Chest Aorta W/cm &/or Wo/cm  Result Date: 08/07/2016 CLINICAL DATA:  61 year old male with chest pain radiating to the back. EXAM: CT ANGIOGRAPHY CHEST WITH CONTRAST TECHNIQUE: Multidetector CT imaging of the chest was performed using the standard protocol during bolus administration of intravenous contrast. Multiplanar CT image reconstructions and MIPs were obtained to evaluate the vascular anatomy. CONTRAST:  80 cc Isovue 370 COMPARISON:  Chest radiograph 08/06/2016 FINDINGS: Cardiovascular: There is no cardiomegaly or pericardial effusion. The thoracic aorta appears unremarkable. No aneurysmal dilatation or evidence of dissection. The origins of the great vessels of the aortic arch appear patent. The central pulmonary arteries are grossly unremarkable. Evaluation of the pulmonary arteries is limited as this study was performed in arterial phase imaging. Mediastinum/Nodes: There is no hilar or mediastinal adenopathy. Esophagus is grossly unremarkable. There is a 3 cm left thyroid gland nodule. Further evaluation with thyroid ultrasound recommended. Lungs/Pleura: An area of minimal atelectasis/ scarring noted in the right lower lobe. There is no focal consolidation, pleural effusion, or pneumothorax. The central airways are patent. Upper Abdomen: Cholecystectomy. There is  apparent fatty infiltration of the liver. Right renal  atrophy. Multiple dilated loops of small bowel noted in the upper abdomen measuring up to 5.3 cm. Most consistent with small bowel obstruction. Evaluation of the bowel is however limited due to partial visualization. Correlation with clinical exam recommended. CT of the abdomen pelvis recommended for further evaluation. Musculoskeletal: No chest wall abnormality. No acute or significant osseous findings. Review of the MIP images confirms the above findings. IMPRESSION: 1. No acute intrathoracic pathology. No CT evidence of aortic dissection or aneurysm. 2. Partially visualized dilated loops of small bowel most suggestive of small-bowel obstruction. CT of the abdomen pelvis recommended for further evaluation. 3. **An incidental finding of potential clinical significance has been found. A 3 cm left thyroid nodule. Ultrasound is recommended for further evaluation.** 4. Fatty liver. 5. Atrophic right kidney. Electronically Signed   By: Elgie Collard M.D.   On: 08/07/2016 05:17     ASSESSMENT AND PLAN:   * Partial small bowel obstruction Improving Start clears Add miralax Likely d/c home tomorrow if no vomiting and has BMs  * HTN Continue home meds  * Chest pain Likely from GERD EKG, troponin and Echo are normal  All the records are reviewed and case discussed with Care Management/Social Workerr. Management plans discussed with the patient, family and they are in agreement.  CODE STATUS: FULL CODE  DVT Prophylaxis: SCDs  TOTAL TIME TAKING CARE OF THIS PATIENT: 30 minutes.   POSSIBLE D/C IN 1-2 DAYS, DEPENDING ON CLINICAL CONDITION.  Milagros Loll R M.D on 08/08/2016 at 11:45 AM  Between 7am to 6pm - Pager - [phone]  After 6pm go to [email or web address] - password [email or web address] [facility]  [facility] [place] [facility]  Office  [phone]  CC: Primary care physician; Nada Boozer, MD  Note: This dictation was prepared with Dragon  dictation along with smaller phrase technology. Any transcriptional errors that result from this process are unintentional.

## 2016-08-08 NOTE — Progress Notes (Signed)
Patient ID: Jared Carey, male   DOB: 1955/08/15, 61 y.o.   MRN: [id number]  Patient reports feeling better today.  No abdominal pain. Had good bowel movement yesterday afternoon.  Abdomen- soft, nontender, active bowel sounds. Ventral hernia remains unchanged.  Impression- clinically much improved.  May start clear liquids.

## 2016-08-09 NOTE — Discharge Instructions (Addendum)
Small Bowel Obstruction A small bowel obstruction means that something is blocking the small bowel. The small bowel is also called the small intestine. It is the long tube that connects the stomach to the colon. An obstruction will stop food and fluids from passing through the small bowel. Treatment depends on what is causing the problem and how bad the problem is. Follow these instructions at home:  Get a lot of rest.  Follow your diet as told by your doctor. You may need to:  Only drink clear liquids until you start to get better.  Avoid solid foods as told by your doctor.  Take over-the-counter and prescription medicines only as told by your doctor.  Keep all follow-up visits as told by your doctor. This is important. Contact a doctor if:  You have a fever.  You have chills. Get help right away if:  You have pain or cramps that get worse.  You throw up (vomit) blood.  You have a feeling of being sick to your stomach (nausea) that does not go away.  You cannot stop throwing up.  You cannot drink fluids.  You feel confused.  You feel dry or thirsty (dehydrated).  Your belly gets more bloated.  You feel weak or you pass out (faint). This information is not intended to replace advice given to you by your health care provider. Make sure you discuss any questions you have with your health care provider. Document Released: 07/08/2004 Document Revised: 01/26/2016 Document Reviewed: 07/25/2014 Elsevier Interactive Patient Education  2017 [employer] Resume diet and activity as before.

## 2016-08-10 NOTE — Discharge Summary (Signed)
[facility] - [place] at [facility]   PATIENT NAME: Jared Carey    MR#:  [id number]  DATE OF BIRTH:  01/20/1956  DATE OF ADMISSION:  08/06/2016 ADMITTING PHYSICIAN: Oralia Manisavid Willis, MD  DATE OF DISCHARGE: 08/09/2016  4:40 PM  PRIMARY CARE PHYSICIAN: Nada BoozerHENDERSON, WENDY, MD   ADMISSION DIAGNOSIS:  Chest pain, unspecified type [R07.9]  DISCHARGE DIAGNOSIS:  Principal Problem:   Chest pain Active Problems:   GERD (gastroesophageal reflux disease)   HTN (hypertension)   Depression   SECONDARY DIAGNOSIS:   Past Medical History:  Diagnosis Date  . ADD (attention deficit disorder)   . Anemia   . Anxiety   . Depression   . Hyperlipidemia   . Hypertension   . Incontinence    urge  . Major depressive disorder   . Obesity   . Obesity   . Renal infarction (HCC)   . Venous insufficiency      ADMITTING HISTORY  HISTORY OF PRESENT ILLNESS:  Jared CedarJames Bong  is a 61 y.o. male who presents with Chest pain. Patient states that he developed some chest pain today, substernal with some radiation to his back. This pain had a waxing and waning course but was constant from the time it started in the evening until he got to the ED. He has known nonobstructive CAD as found during cardiac catheterization 2 years ago which showed 20% blockage in one vessel. Given his risk factors, hospitalists were called for admission and further evaluation.    HOSPITAL COURSE:   * Partial small bowel obstruction CT abd showed partial SBO Resolved Tolerating diet. Had bowel movements No abd pain or nausea. Seen by surgery. [facility]. Did not ned NGT  * HTN Continue home meds  * Chest pain - resolved Likely from reflux due to partial SBO EKG, troponin and Echo are normal.  Stable for discharge home  CONSULTS OBTAINED:  Treatment Team:  Kieth BrightlySeeplaputhur G Sankar, MD  DRUG ALLERGIES:   Allergies  Allergen Reactions  . Benadryl [Diphenhydramine Hcl (Sleep)]    . Simvastatin     DISCHARGE MEDICATIONS:   Discharge Medication List as of 08/09/2016  2:29 PM    CONTINUE these medications which have NOT CHANGED   Details  albuterol (PROVENTIL HFA;VENTOLIN HFA) 108 (90 BASE) MCG/ACT inhaler Inhale 2 puffs into the lungs every 6 (six) hours as needed for wheezing or shortness of breath., Historical Med    amLODipine (NORVASC) 10 MG tablet Take 10 mg by mouth daily., Historical Med    aspirin 325 MG tablet Take 975 mg by mouth every 6 (six) hours as needed., Historical Med    fluticasone (FLONASE) 50 MCG/ACT nasal spray Place 1 spray into both nostrils daily., Historical Med    oxybutynin (DITROPAN-XL) 10 MG 24 hr tablet Take 20 mg by mouth 2 (two) times daily., Historical Med    pantoprazole (PROTONIX) 40 MG tablet Take 40 mg by mouth daily., Historical Med    propranolol (INDERAL) 40 MG tablet Take 20 mg by mouth 2 (two) times daily. , Historical Med    venlafaxine XR (EFFEXOR-XR) 150 MG 24 hr capsule Take 150 mg by mouth daily with breakfast., Historical Med    HYDROcodone-acetaminophen (NORCO) 5-325 MG tablet Take 1 tablet by mouth 3 (three) times daily as needed., Starting Wed 06/09/2016, Print        Today   VITAL SIGNS:  Blood pressure 121/76, pulse 72, temperature 97.9 F (36.6 C), temperature source Oral,  resp. rate 20, height 6\' 1"  (1.854 m), weight 133.3 kg (293 lb 14.4 oz), SpO2 99 %.  I/O:   Intake/Output Summary (Last 24 hours) at 08/10/16 1225 Last data filed at 08/09/16 1357  Gross per 24 hour  Intake              240 ml  Output                0 ml  Net              240 ml    PHYSICAL EXAMINATION:  Physical Exam  GENERAL:  61 y.o.-year-old patient lying in the bed with no acute distress.  LUNGS: Normal breath sounds bilaterally, no wheezing, rales,rhonchi or crepitation. No use of accessory muscles of respiration.  CARDIOVASCULAR: S1, S2 normal. No murmurs, rubs, or gallops.  ABDOMEN: Soft, non-tender,  non-distended. Bowel sounds present. No organomegaly or mass.  Ventral hernia NEUROLOGIC: Moves all 4 extremities. PSYCHIATRIC: The patient is alert and oriented x 3.  SKIN: No obvious rash, lesion, or ulcer.   DATA REVIEW:   CBC  Recent Labs Lab 08/07/16 0724  WBC 7.6  HGB 14.7  HCT 43.4  PLT 283    Chemistries   Recent Labs Lab 08/06/16 2042 08/07/16 0724  NA 134* 136  K 3.8 4.0  CL 100* 103  CO2 24 25  GLUCOSE 121* 124*  BUN 27* 21*  CREATININE 1.49* 1.14  CALCIUM 8.3* 8.0*  AST 22  --   ALT 14*  --   ALKPHOS 66  --   BILITOT 0.8  --     Cardiac Enzymes  Recent Labs Lab 08/07/16 1327  TROPONINI <0.03    Microbiology Results  No results found for this or any previous visit.  RADIOLOGY:  No results found.  Follow up with PCP in 1 week.  Management plans discussed with the patient, family and they are in agreement.  CODE STATUS:  Code Status History    Date Active Date Inactive Code Status Order ID Comments User Context   08/07/2016  1:25 AM 08/09/2016  7:45 PM Full Code [id number]  Oralia Manis, MD Inpatient      TOTAL TIME TAKING CARE OF THIS PATIENT ON DAY OF DISCHARGE: more than 30 minutes.   Milagros Loll R M.D on 08/10/2016 at 12:25 PM  Between 7am to 6pm - Pager - [phone]  After 6pm go to [email or web address] - password [email or web address] [facility]  [facility] [place] [facility]  Office  [phone]  CC: Primary care physician; Nada Boozer, MD  Note: This dictation was prepared with Dragon dictation along with smaller phrase technology. Any transcriptional errors that result from this process are unintentional.

## 2017-05-07 ENCOUNTER — Emergency Department: Payer: Medicaid Other

## 2017-05-07 ENCOUNTER — Encounter: Payer: Self-pay | Admitting: Emergency Medicine

## 2017-05-07 ENCOUNTER — Emergency Department
Admission: EM | Admit: 2017-05-07 | Discharge: 2017-05-07 | Disposition: A | Discharge status: Home / Self Care | Payer: Medicaid Other | Attending: Emergency Medicine | Admitting: Emergency Medicine

## 2017-05-07 ENCOUNTER — Other Ambulatory Visit: Payer: Self-pay

## 2017-05-07 DIAGNOSIS — Z87891 Personal history of nicotine dependence: Secondary | ICD-10-CM | POA: Insufficient documentation

## 2017-05-07 DIAGNOSIS — R609 Edema, unspecified: Secondary | ICD-10-CM

## 2017-05-07 DIAGNOSIS — N5089 Other specified disorders of the male genital organs: Secondary | ICD-10-CM | POA: Diagnosis not present

## 2017-05-07 DIAGNOSIS — I10 Essential (primary) hypertension: Secondary | ICD-10-CM | POA: Insufficient documentation

## 2017-05-07 DIAGNOSIS — N50812 Left testicular pain: Secondary | ICD-10-CM | POA: Diagnosis present

## 2017-05-07 DIAGNOSIS — Z79899 Other long term (current) drug therapy: Secondary | ICD-10-CM | POA: Insufficient documentation

## 2017-05-07 NOTE — ED Provider Notes (Signed)
[facility] Emergency Department Provider Note   ____________________________________________   I have reviewed the triage vital signs and the nursing notes.   HISTORY  Chief Complaint Testicle Pain   History limited by: Not Limited   HPI Jared Carey is a 61 y.o. male who presents to the emergency department today because of testicular pain and swelling.   LOCATION:left testicle DURATION:2 days TIMING: pain started first, then swelling SEVERITY: 6/10 CONTEXT: patient states that he went to sit down on a hard chair when he pinched his testicle between the chair and his thigh. Since then has had pain. Started noticing swelling as well. MODIFYING FACTORS: worse with palpation ASSOCIATED SYMPTOMS: denies any painful urination or difficulty initiating urination. Denies change in bowel movements.   Per medical record review patient does not have history of DM.  Past Medical History:  Diagnosis Date  . ADD (attention deficit disorder)   . Anemia   . Anxiety   . Depression   . Hyperlipidemia   . Hypertension   . Incontinence    urge  . Major depressive disorder   . Obesity   . Obesity   . Renal infarction (HCC)   . Venous insufficiency     Patient Active Problem List   Diagnosis Date Noted  . Chest pain 08/07/2016  . GERD (gastroesophageal reflux disease) 08/07/2016  . HTN (hypertension) 08/07/2016  . Depression 08/07/2016    Past Surgical History:  Procedure Laterality Date  . LAPAROSCOPIC CHOLECYSTECTOMY    . LUMBAR DISC SURGERY     L5-S1     Prior to Admission medications   Medication Sig Start Date End Date Taking? Authorizing Provider  albuterol (PROVENTIL HFA;VENTOLIN HFA) 108 (90 BASE) MCG/ACT inhaler Inhale 2 puffs into the lungs every 6 (six) hours as needed for wheezing or shortness of breath.    [provider]  amLODipine (NORVASC) 10 MG tablet Take 10 mg by mouth daily.    [provider]  aspirin 325  MG tablet Take 975 mg by mouth every 6 (six) hours as needed.    [provider]  fluticasone (FLONASE) 50 MCG/ACT nasal spray Place 1 spray into both nostrils daily.    [provider]  HYDROcodone-acetaminophen (NORCO) 5-325 MG tablet Take 1 tablet by mouth 3 (three) times daily as needed. Patient not taking: Reported on 08/06/2016 06/09/16   Menshew, Charlesetta IvoryJenise V Bacon, PA-C  oxybutynin (DITROPAN-XL) 10 MG 24 hr tablet Take 20 mg by mouth 2 (two) times daily.    [provider]  pantoprazole (PROTONIX) 40 MG tablet Take 40 mg by mouth daily.    [provider]  propranolol (INDERAL) 40 MG tablet Take 20 mg by mouth 2 (two) times daily.     [provider]  venlafaxine XR (EFFEXOR-XR) 150 MG 24 hr capsule Take 150 mg by mouth daily with breakfast.    [provider]    Allergies Benadryl [diphenhydramine hcl (sleep)] and Simvastatin  Family History  Problem Relation Age of Onset  . Hypertension Other     Social History Social History   Tobacco Use  . Smoking status: Former Games developermoker  . Smokeless tobacco: Never Used  Substance Use Topics  . Alcohol use: No  . Drug use: No    Review of Systems Constitutional: No fever/chills Eyes: No visual changes. ENT: No sore throat. Cardiovascular: Denies chest pain. Respiratory: Denies shortness of breath. Gastrointestinal: No abdominal pain.  No nausea, no vomiting.  No diarrhea.  Genitourinary: Positive for scrotal swelling and pain. Musculoskeletal: Negative for back pain. Skin: Negative for rash. Neurological: Negative for headaches, focal weakness or numbness.  ____________________________________________   PHYSICAL EXAM:  VITAL SIGNS: ED Triage Vitals  Enc Vitals Group     BP 05/07/17 1552 (!) 144/84     Pulse Rate 05/07/17 1552 84     Resp --      Temp 05/07/17 1552 98.2 F (36.8 C)     Temp Source 05/07/17 1552 Oral     SpO2 05/07/17 1552 97 %     Weight 05/07/17 1552  300 lb (136.1 kg)     Height 05/07/17 1552 6\' 1"  (1.854 m)     Head Circumference --      Peak Flow --      Pain Score 05/07/17 1558 6   Constitutional: Alert and oriented. Well appearing and in no distress. Eyes: Conjunctivae are normal.  ENT   Head: Normocephalic and atraumatic.   Nose: No congestion/rhinnorhea.   Mouth/Throat: Mucous membranes are moist.   Neck: No stridor. Hematological/Lymphatic/Immunilogical: No cervical lymphadenopathy. Cardiovascular: Normal rate, regular rhythm.  No murmurs, rubs, or gallops. Respiratory: Normal respiratory effort without tachypnea nor retractions. Breath sounds are clear and equal bilaterally. No wheezes/rales/rhonchi. Gastrointestinal: Soft and non tender. No rebound. No guarding.  Genitourinary: Scrotum with some swelling and tenderness primarily on the left side. No erythema, no warmth. Musculoskeletal: Normal range of motion in all extremities. No lower extremity edema. Neurologic:  Normal speech and language. No gross focal neurologic deficits are appreciated.  Skin:  Skin is warm, dry and intact. No rash noted. Psychiatric: Mood and affect are normal. Speech and behavior are normal. Patient exhibits appropriate insight and judgment.  ____________________________________________    LABS (pertinent positives/negatives)  None  ____________________________________________   EKG  None  ____________________________________________    RADIOLOGY  US scrotum Small left and trace right hydrocele  ____________________________________________   PROCEDURES  Procedures  ____________________________________________   INITIAL IMPRESSION / ASSESSMENT AND PLAN / ED COURSE  Pertinent labs & imaging results that were available during my care of the patient were reviewed by me and considered in my medical decision making (see chart for details).  Patient presents with scrotal swelling and pain. Concern for ruptured  testicle, torsion, infection amongst other etiologies. On exam no concerning signs of infection. US without concerning findings. At this time think edema most likely related to trauma. Discussed care with patient. Will plan on discharging home to follow up with urologist ([facility]  ____________________________________________   FINAL CLINICAL IMPRESSION(S) / ED DIAGNOSES  Final diagnoses:  Swelling  Scrotal swelling     Note: This dictation was prepared with Dragon dictation. Any transcriptional errors that result from this process are unintentional     Phineas SemenGoodman, Kalea Perine, MD 05/07/17 1944

## 2017-05-07 NOTE — Discharge Instructions (Signed)
Please seek medical attention for any high fevers, chest pain, shortness of breath, change in behavior, persistent vomiting, bloody stool or any other new or concerning symptoms.  

## 2017-05-07 NOTE — ED Triage Notes (Signed)
Pt  Reports that he went to sit on a chair Thursday evening and his testicles got pinched between the chair and leg. He noticed that this am it was swollen and blue. Denies any urinary symptoms.

## 2017-09-12 HISTORY — PX: COLONOSCOPY: SHX174

## 2017-09-12 HISTORY — PX: ESOPHAGOGASTRODUODENOSCOPY: SHX1529

## 2018-02-28 ENCOUNTER — Ambulatory Visit (INDEPENDENT_AMBULATORY_CARE_PROVIDER_SITE_OTHER): Payer: Medicare Other | Admitting: Internal Medicine

## 2018-02-28 ENCOUNTER — Encounter: Payer: Self-pay | Admitting: Internal Medicine

## 2018-02-28 VITALS — BP 104/78 | Ht 73.0 in | Wt 331.0 lb

## 2018-02-28 DIAGNOSIS — M5136 Other intervertebral disc degeneration, lumbar region: Secondary | ICD-10-CM

## 2018-02-28 DIAGNOSIS — F988 Other specified behavioral and emotional disorders with onset usually occurring in childhood and adolescence: Secondary | ICD-10-CM | POA: Insufficient documentation

## 2018-02-28 DIAGNOSIS — E785 Hyperlipidemia, unspecified: Secondary | ICD-10-CM | POA: Insufficient documentation

## 2018-02-28 DIAGNOSIS — G2401 Drug induced subacute dyskinesia: Secondary | ICD-10-CM | POA: Diagnosis not present

## 2018-02-28 DIAGNOSIS — N3281 Overactive bladder: Secondary | ICD-10-CM | POA: Insufficient documentation

## 2018-02-28 DIAGNOSIS — M180 Bilateral primary osteoarthritis of first carpometacarpal joints: Secondary | ICD-10-CM | POA: Insufficient documentation

## 2018-02-28 DIAGNOSIS — Z560 Unemployment, unspecified: Secondary | ICD-10-CM

## 2018-02-28 DIAGNOSIS — K2270 Barrett's esophagus without dysplasia: Secondary | ICD-10-CM

## 2018-02-28 DIAGNOSIS — M51369 Other intervertebral disc degeneration, lumbar region without mention of lumbar back pain or lower extremity pain: Secondary | ICD-10-CM | POA: Insufficient documentation

## 2018-02-28 DIAGNOSIS — E782 Mixed hyperlipidemia: Secondary | ICD-10-CM

## 2018-02-28 DIAGNOSIS — F324 Major depressive disorder, single episode, in partial remission: Secondary | ICD-10-CM

## 2018-02-28 DIAGNOSIS — M1811 Unilateral primary osteoarthritis of first carpometacarpal joint, right hand: Secondary | ICD-10-CM | POA: Diagnosis not present

## 2018-02-28 DIAGNOSIS — I10 Essential (primary) hypertension: Secondary | ICD-10-CM

## 2018-02-28 DIAGNOSIS — E042 Nontoxic multinodular goiter: Secondary | ICD-10-CM

## 2018-02-28 DIAGNOSIS — M1812 Unilateral primary osteoarthritis of first carpometacarpal joint, left hand: Secondary | ICD-10-CM

## 2018-02-28 DIAGNOSIS — K5901 Slow transit constipation: Secondary | ICD-10-CM

## 2018-02-28 DIAGNOSIS — K2100 Gastro-esophageal reflux disease with esophagitis, without bleeding: Secondary | ICD-10-CM

## 2018-02-28 DIAGNOSIS — J3089 Other allergic rhinitis: Secondary | ICD-10-CM | POA: Insufficient documentation

## 2018-02-28 DIAGNOSIS — Z860100 Personal history of colon polyps, unspecified: Secondary | ICD-10-CM | POA: Insufficient documentation

## 2018-02-28 MED ORDER — RANITIDINE HCL 150 MG PO TABS: 150.0000 mg | ORAL_TABLET | Freq: Two times a day (BID) | ORAL | 5 refills | Status: DC

## 2018-02-28 NOTE — Progress Notes (Signed)
Date:  02/28/2018   Name:  Jared Carey   DOB:  January 17, 1956   MRN:  [id number]  [facility] - but is 45 minutes away and needs to get a local MD.  He has been seeing multiple specialists through the [facility].  He will need to find some local specialists.  Chief Complaint: Establish Care; Fatigue (Complaining of being tired all the time and wanted to discuss today. ); and Osteoarthritis (in both thumbs- would like referral to a orthopedics to discuss surgery. Knows he does not need referral for insurance but would like one anyway. )  Back Pain  This is a chronic problem. The pain is present in the lumbar spine. Associated symptoms include abdominal pain. Pertinent negatives include no chest pain, fever or headaches. He has tried analgesics (also lidocaine topical and aspirin) for the symptoms. Improvement on treatment: has had ESI in the past with good effect.  Hypertension  This is a chronic problem. The problem is controlled. Pertinent negatives include no chest pain, headaches, palpitations or shortness of breath. Past treatments include calcium channel blockers and beta blockers. There is no history of CAD/MI. cardiac cath 2017 - all clear .  Depression         This is a chronic problem.The problem is unchanged.  Associated symptoms include fatigue and myalgias.  Associated symptoms include no headaches.  Past treatments include SNRIs - Serotonin and norepinephrine reuptake inhibitors. Gastroesophageal Reflux  He complains of abdominal pain and heartburn. He reports no chest pain, no coughing or no wheezing. This is a chronic problem. The problem occurs occasionally. Associated symptoms include fatigue. He has tried a PPI (but had stomach pain and diarrhea from omeprazole) for the symptoms.  Thumb pain - OA in both, had been getting injections but stopped when new MDs starting training on him.  His problem is wear and tear OA and needs surgery. Tardive dyskinesia - from Abilify for  depression and hallucinations. He has been seeing a Insurance account manager at the [place] and medication was considered. He would like to see another one to see if he there was something he could do.  His ambulation is being affected, as well as writing, drinking.    Review of Systems  Constitutional: Positive for fatigue. Negative for chills and fever.  HENT: Negative for trouble swallowing.   Eyes: Negative for visual disturbance.  Respiratory: Negative for cough, chest tightness, shortness of breath and wheezing.   Cardiovascular: Positive for leg swelling. Negative for chest pain and palpitations.  Gastrointestinal: Positive for abdominal pain and heartburn. Negative for constipation and diarrhea.  Musculoskeletal: Positive for back pain, joint swelling and myalgias.  Skin: Negative for color change and rash.  Allergic/Immunologic: Positive for environmental allergies.  Neurological: Positive for tremors. Negative for dizziness, light-headedness and headaches.  Hematological: Negative for adenopathy.  Psychiatric/Behavioral: Positive for depression, dysphoric mood and sleep disturbance.    Patient Active Problem List   Diagnosis Date Noted  . Barrett's esophagus determined by endoscopy 02/28/2018  . Multiple thyroid nodules 02/28/2018  . OAB (overactive bladder) 02/28/2018  . Tardive dyskinesia 02/28/2018  . History of colon polyps 02/28/2018  . Attention deficit disorder (ADD) 02/28/2018  . Environmental and seasonal allergies 02/28/2018  . Osteoarthritis of both thumbs 02/28/2018  . Chest pain 08/07/2016  . GERD (gastroesophageal reflux disease) 08/07/2016  . HTN (hypertension) 08/07/2016  . Depression 08/07/2016    Allergies  Allergen Reactions  . Benadryl [Diphenhydramine Hcl (Sleep)]   . Simvastatin  Past Surgical History:  Procedure Laterality Date  . COLONOSCOPY  09/2017   [facility]  . ESOPHAGOGASTRODUODENOSCOPY  09/2017   [facility]  . INGUINAL HERNIA REPAIR Right 1982  .  LAPAROSCOPIC CHOLECYSTECTOMY  2002  . LUMBAR DISC SURGERY  1999   L5-S1 fusion  . NERVE AND TENDON REPAIR Left 1998   left elbow  . TONSILLECTOMY  1960    Social History   Tobacco Use  . Smoking status: Former Smoker    Years: 7.00    Types: Cigarettes    Last attempt to quit: 1977    Years since quitting: 42.7  . Smokeless tobacco: Former NeurosurgeonUser    Types: Chew    Quit date: 1977  Substance Use Topics  . Alcohol use: Not Currently    Comment: Quit 1985  . Drug use: Not Currently    Types: Marijuana, Methamphetamines    Comment: Quit 1984     Medication list has been reviewed and updated.  Current Meds  Medication Sig  . albuterol (PROVENTIL HFA;VENTOLIN HFA) 108 (90 BASE) MCG/ACT inhaler Inhale 2 puffs into the lungs every 6 (six) hours as needed for wheezing or shortness of breath.  Marland Kitchen. amLODipine (NORVASC) 10 MG tablet Take 5 mg by mouth daily.   Marland Kitchen. aspirin 325 MG tablet Take 975 mg by mouth every 6 (six) hours as needed.  Marland Kitchen. co-enzyme Q-10 30 MG capsule Take 30 mg by mouth 3 (three) times daily.  . cyanocobalamin 100 MCG tablet Take 100 mcg by mouth daily.  Marland Kitchen. docusate sodium (COLACE) 50 MG capsule Take 50 mg by mouth 2 (two) times daily.  . fluticasone (FLONASE) 50 MCG/ACT nasal spray Place 1 spray into both nostrils daily.  Marland Kitchen. lidocaine-hydrocortisone (ANAMANTEL HC) 3-0.5 % CREA Place 1 Applicatorful rectally.  . Multiple Vitamins-Minerals (MULTIVITAMIN WITH MINERALS) tablet Take 1 tablet by mouth daily.  . Omega-3 Fatty Acids (FISH OIL) 1000 MG CAPS Take by mouth.  . oxybutynin (DITROPAN-XL) 10 MG 24 hr tablet Take 20 mg by mouth 2 (two) times daily.  . propranolol (INDERAL) 40 MG tablet Take 20 mg by mouth 2 (two) times daily.   . psyllium (METAMUCIL) 58.6 % packet Take 1 packet by mouth daily.  Marland Kitchen. venlafaxine (EFFEXOR) 75 MG tablet Take 75 mg by mouth 3 (three) times daily with meals.    No flowsheet data found.  Physical Exam  Constitutional: He is oriented to  person, place, and time. He appears well-developed and well-nourished. No distress.  HENT:  Head: Normocephalic and atraumatic.  Eyes: Pupils are equal, round, and reactive to light.  Neck: Normal range of motion. Neck supple.  Cardiovascular: Normal rate, regular rhythm and normal heart sounds.  Pulmonary/Chest: Effort normal and breath sounds normal. No respiratory distress.  Musculoskeletal:  Tender at base of left thumb, no thenar atrophy Grip markedly decreased due to pain  Neurological: He is alert and oriented to person, place, and time.  Abnormal motor movements of lips and tongue (mild)  Skin: Skin is warm and dry. No rash noted.  Psychiatric: He has a normal mood and affect. His speech is normal and behavior is normal. Thought content normal. Cognition and memory are normal.  Nursing note and vitals reviewed.   BP 104/78 (BP Location: Right Arm, Patient Position: Sitting, Cuff Size: Large)   Ht 6\' 1"  (1.854 m)   Wt (!) 331 lb (150.1 kg)   BMI 43.67 kg/m   Assessment and Plan: 1. Osteoarthritis of both thumbs Will refer to [facility] - Ambulatory referral to Orthopedic Surgery  2. Barrett's esophagus determined by endoscopy Intolerant to several PPIs but has not tried Zantac - ranitidine (ZANTAC) 150 MG tablet; Take 1 tablet (150 mg total) by mouth 2 (two) times daily.  Dispense: 60 tablet; Refill: 5  3. Essential hypertension controlled  4. Tardive dyskinesia - Ambulatory referral to Neurology  5. Major depressive disorder with single episode, in partial remission (HCC) Pt reports doing fairly well on Effexor 225 mg per day but scores high on PHQ9 Will need psych care in the near future  6. Not currently working due to disabled status  7. GERD with esophagitis Trial of zantac  8. Multiple thyroid nodules Recent US stable  9. Degeneration, intervertebral disc, lumbar Will need Neurosurgeon vs pain management for repeat ESI  10. Constipation  due to slow transit Continue current regimen  11. Mixed hyperlipidemia Intolerant of statins   Meds ordered this encounter  Medications  . ranitidine (ZANTAC) 150 MG tablet    Sig: Take 1 tablet (150 mg total) by mouth 2 (two) times daily.    Dispense:  60 tablet    Refill:  5    Partially dictated using Animal nutritionist. Any errors are unintentional.  Bari Edward, MD [facility]  02/28/2018

## 2018-03-02 DIAGNOSIS — M1811 Unilateral primary osteoarthritis of first carpometacarpal joint, right hand: Secondary | ICD-10-CM | POA: Diagnosis not present

## 2018-03-02 DIAGNOSIS — M1812 Unilateral primary osteoarthritis of first carpometacarpal joint, left hand: Secondary | ICD-10-CM | POA: Diagnosis not present

## 2018-04-02 IMAGING — CR DG CHEST 2V
2 series · 2 of 2 positions shown · non-contrast
Comparison: None.

CLINICAL DATA: 60 y/o  M; chest pain.

EXAM:
CHEST  2 VIEW

[chest pa]
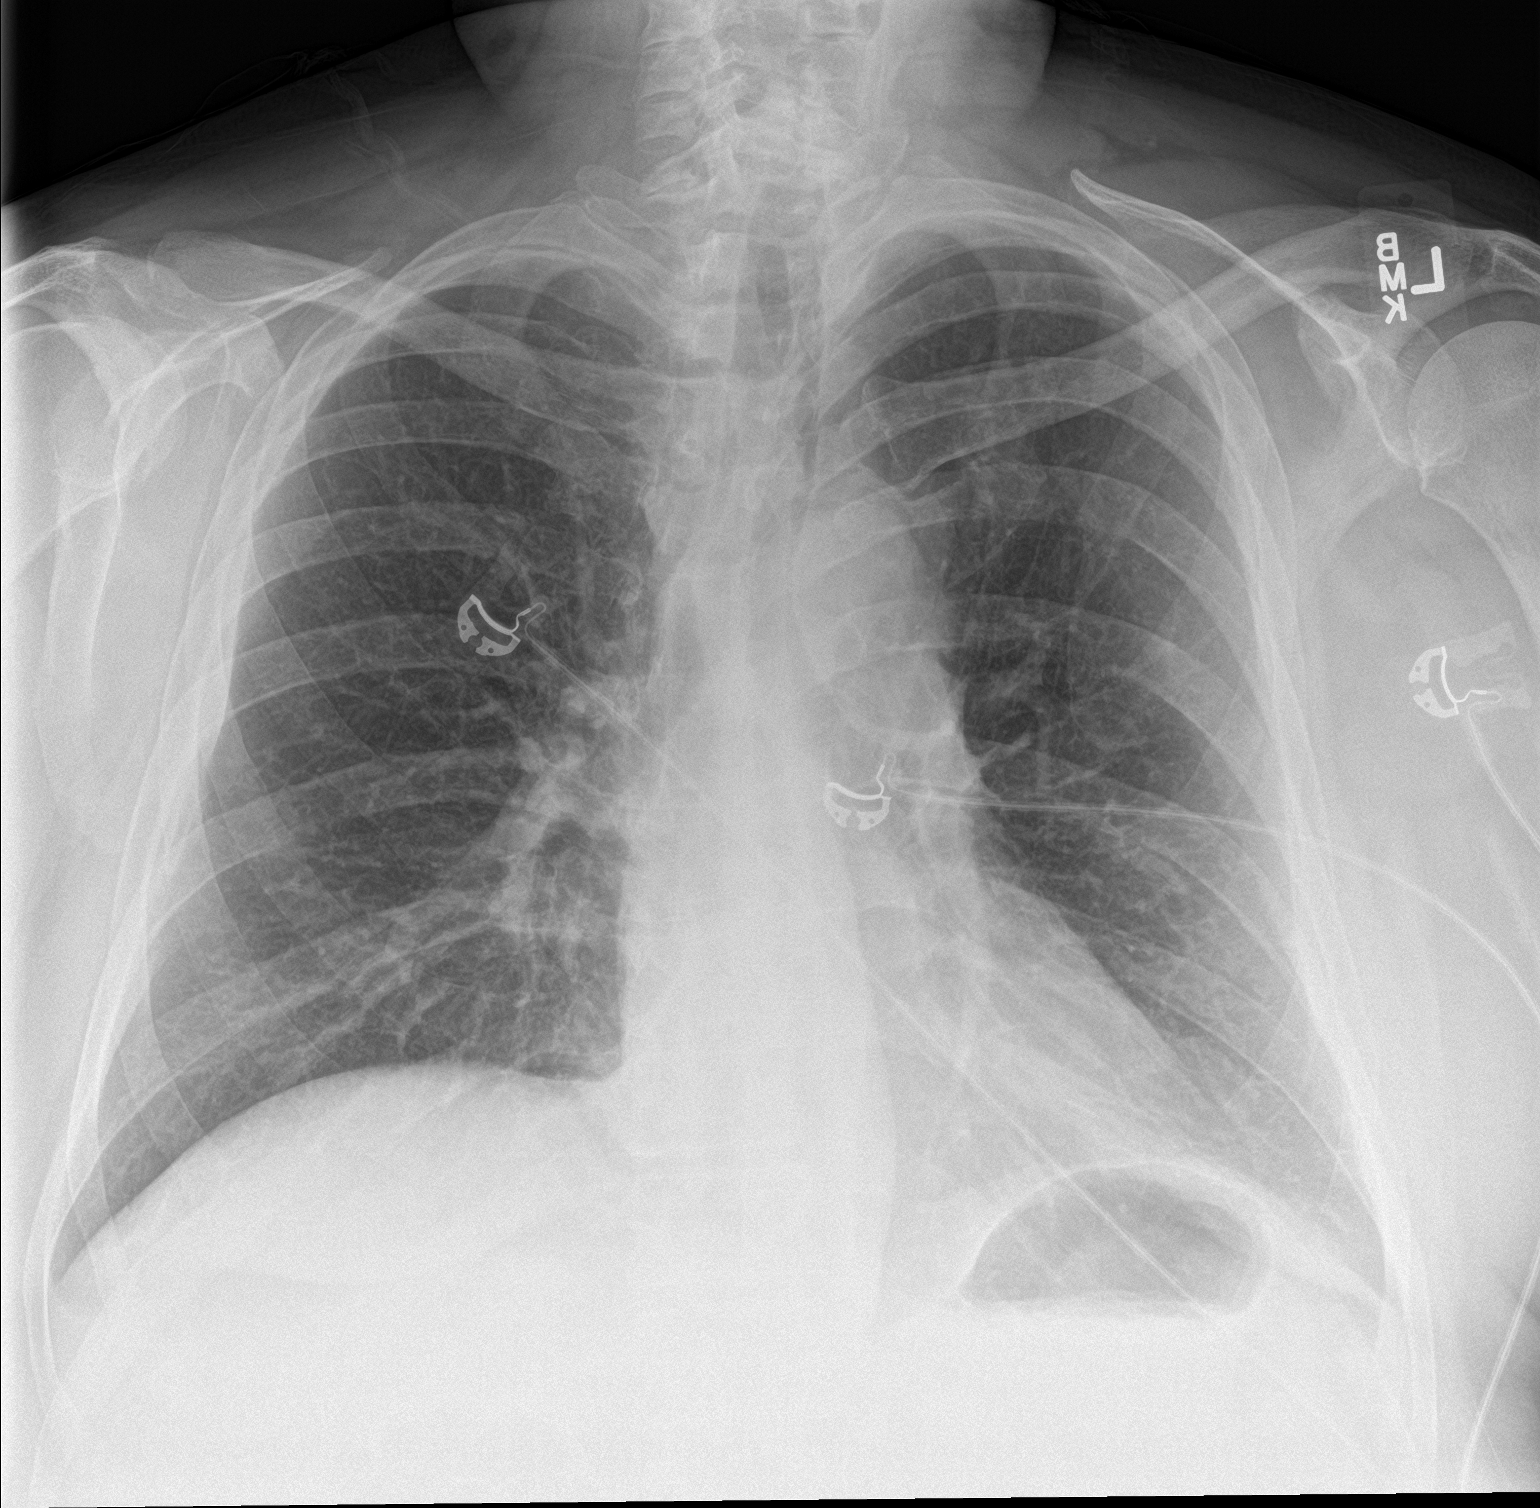

[chest lat]
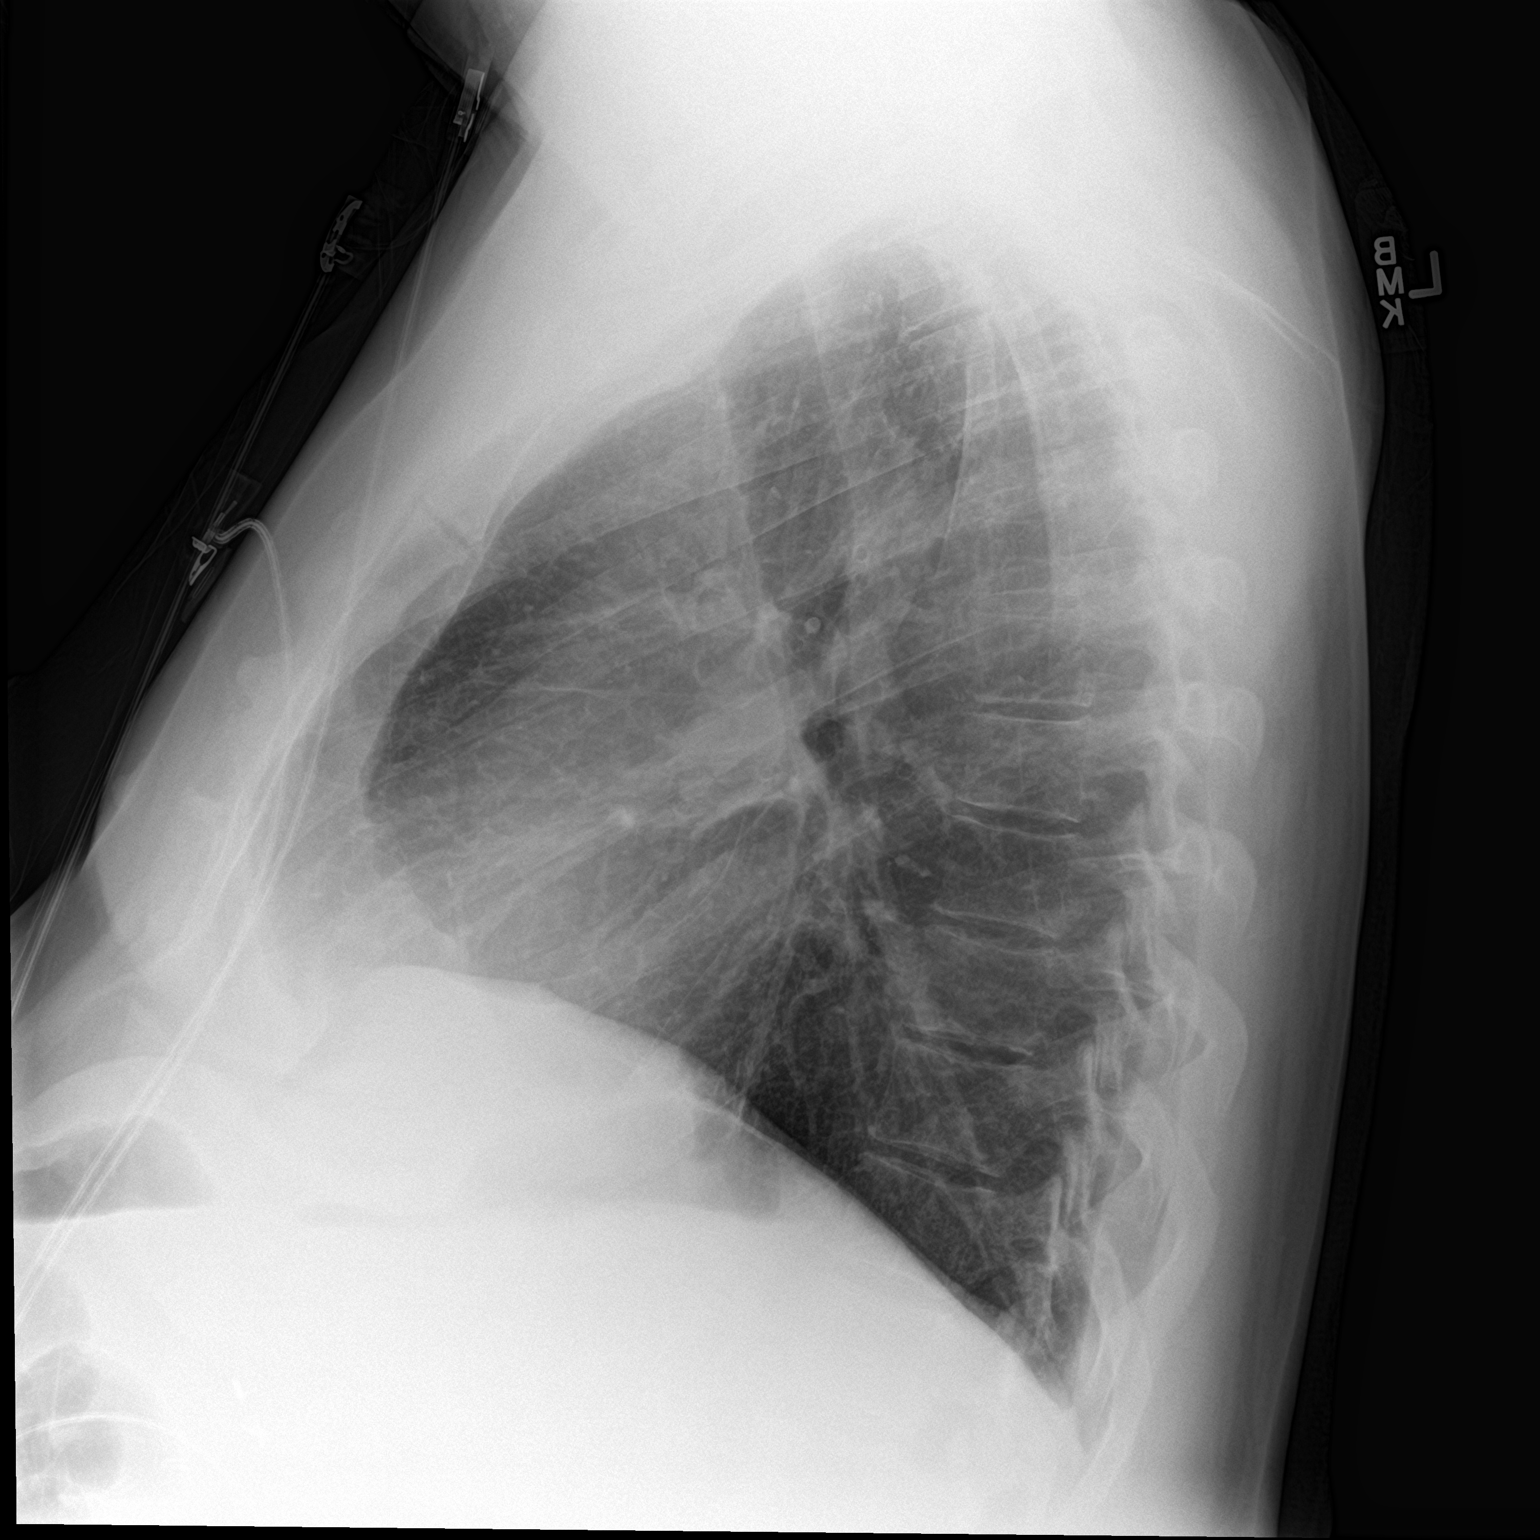

[2 of 2 positions shown; findings below may reference images not displayed]

FINDINGS: The heart size and mediastinal contours are within normal limits.
Both lungs are clear. The visualized skeletal structures are
unremarkable.
IMPRESSION: No active cardiopulmonary disease.

By: Musima Farnesi M.D.

## 2018-04-03 IMAGING — CT CT ABD-PELV W/O CM
2 of 4 series · 16 of 46 positions shown, 18 images · non-contrast
Comparison: None.

CLINICAL DATA: 60-year-old male with abdominal and pelvic pain for
2 days.

EXAM:
CT ABDOMEN AND PELVIS WITHOUT CONTRAST
TECHNIQUE: Multidetector CT imaging of the abdomen and pelvis was performed
following the standard protocol without IV contrast. Intravenous
contrast was not administered as the patient received contrast on a
her earlier chest CT today.

[Series 2: routine abd/pel wo · axial · 0.98mm/px · z∈[-710,-190]mm · 13 of 114 slices shown, 15 images]
[im 5/114  soft-tissue]
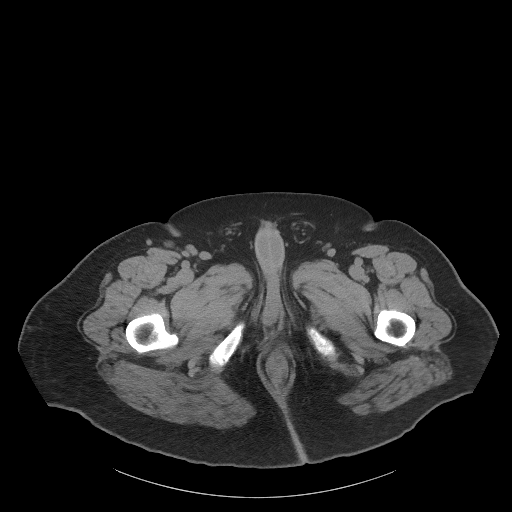
[im 5/114  bone]
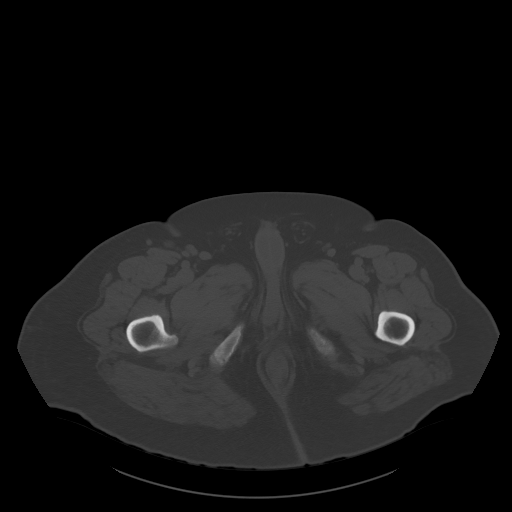
[im 15/114  soft-tissue]
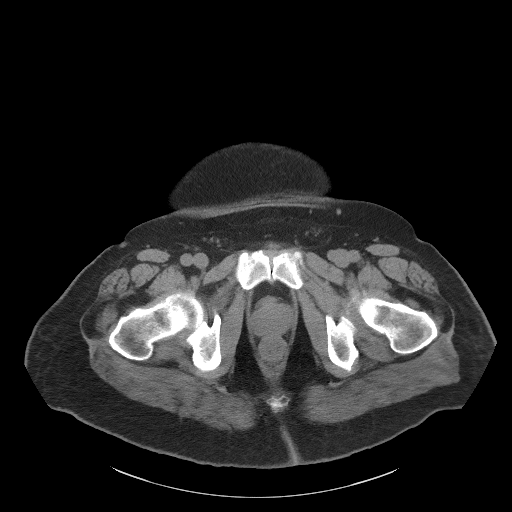
[im 24/114  soft-tissue]
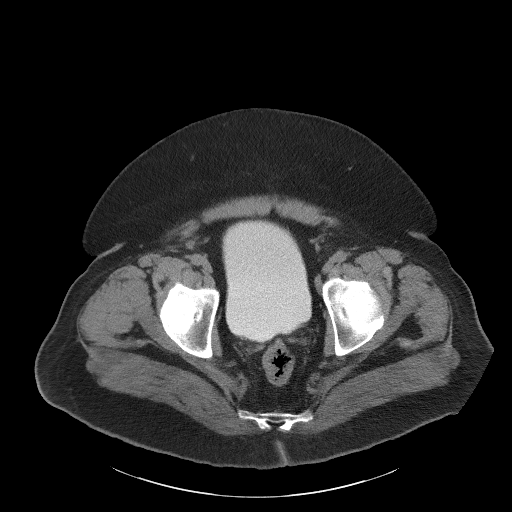
[im 33/114  soft-tissue]
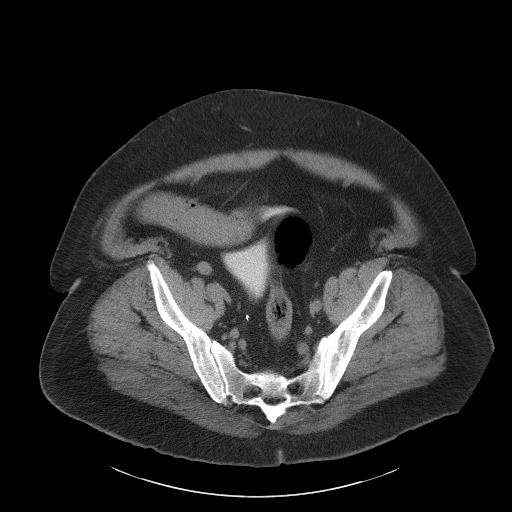
[im 38/114  soft-tissue]
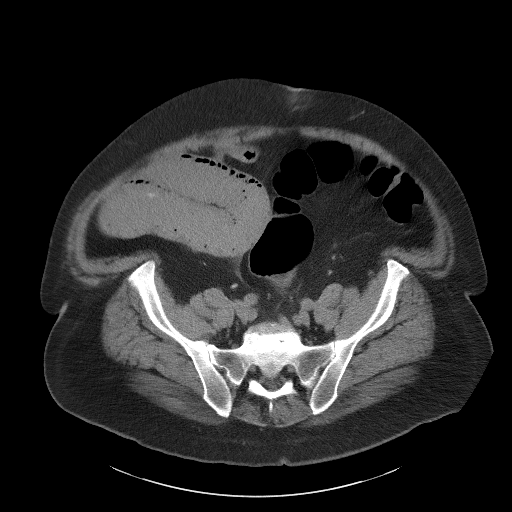
[im 48/114  soft-tissue]
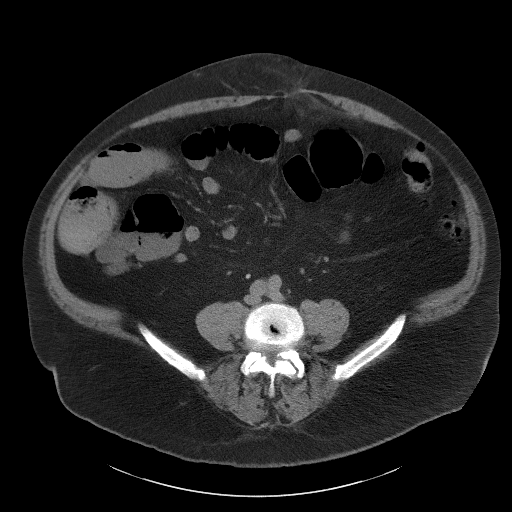
[im 57/114  soft-tissue]
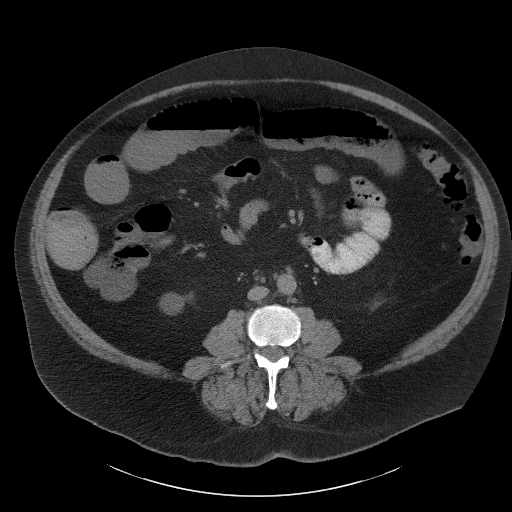
[im 66/114  soft-tissue]
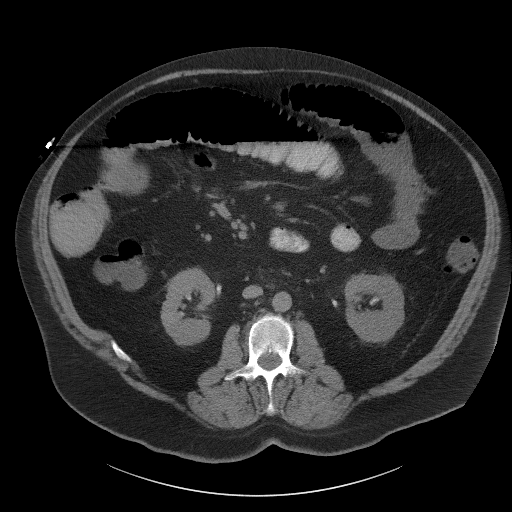
[im 76/114  soft-tissue]
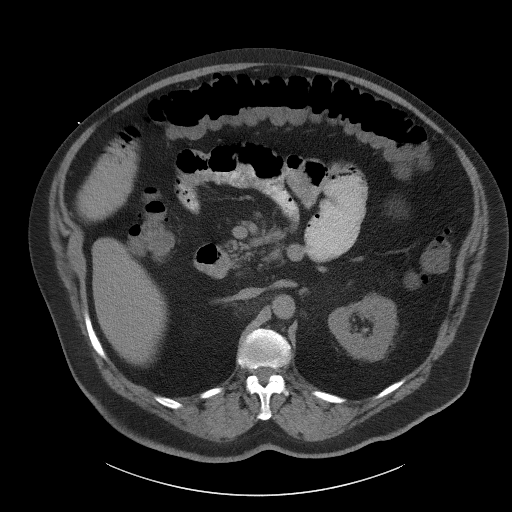
[im 76/114  bone]
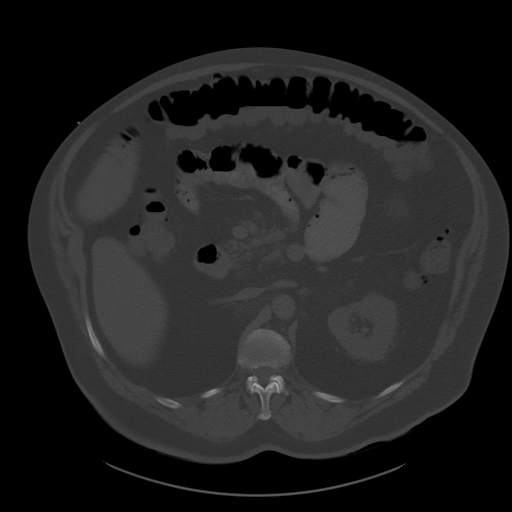
[im 81/114  soft-tissue]
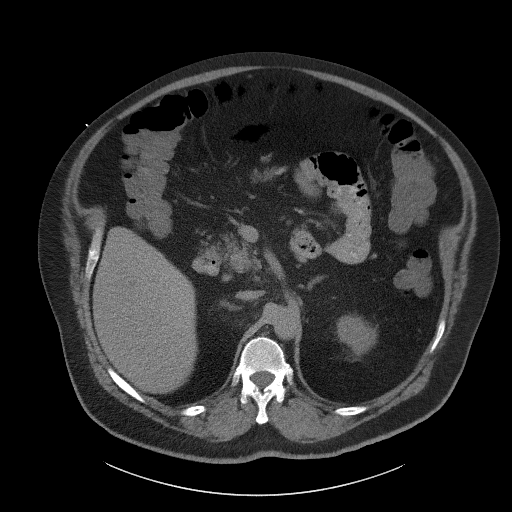
[im 90/114  soft-tissue]
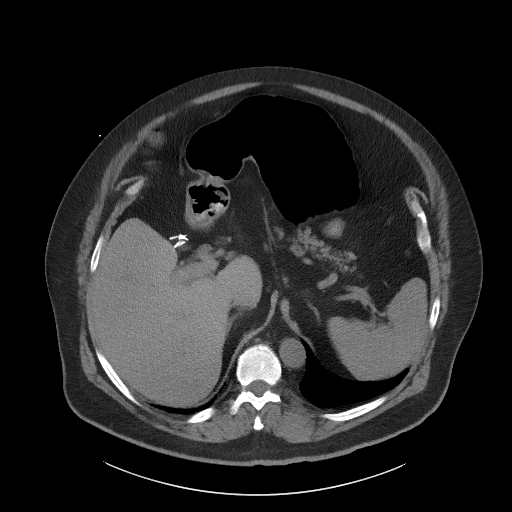
[im 99/114  soft-tissue]
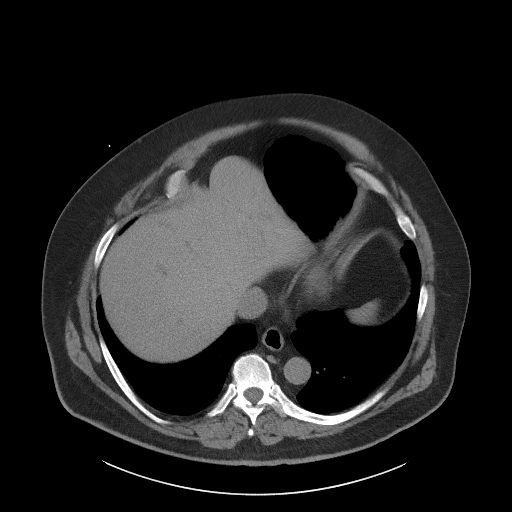
[im 109/114  soft-tissue]
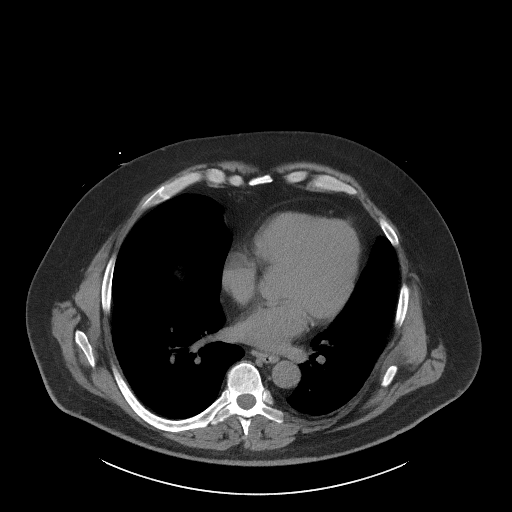

[Series 5: coronal st · coronal · 1.00mm/px · 3 of 137 slices shown]
[im 46/137  soft-tissue]
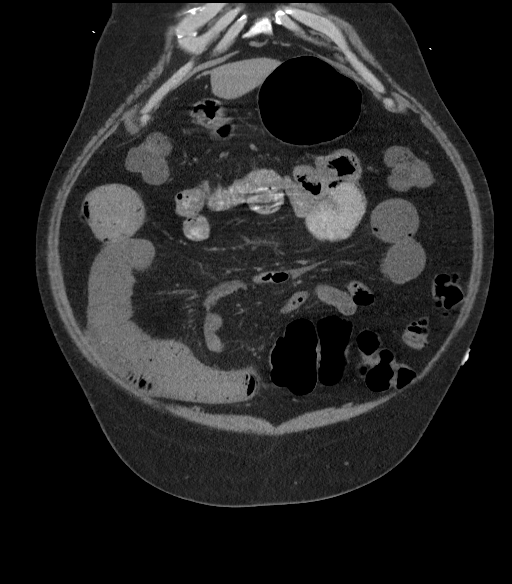
[im 61/137  soft-tissue]
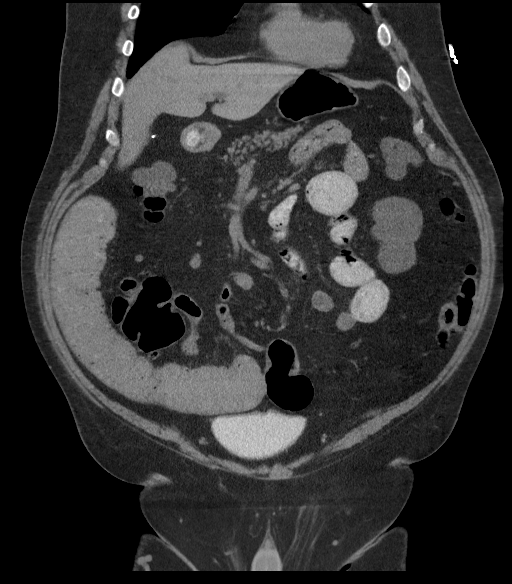
[im 76/137  soft-tissue]
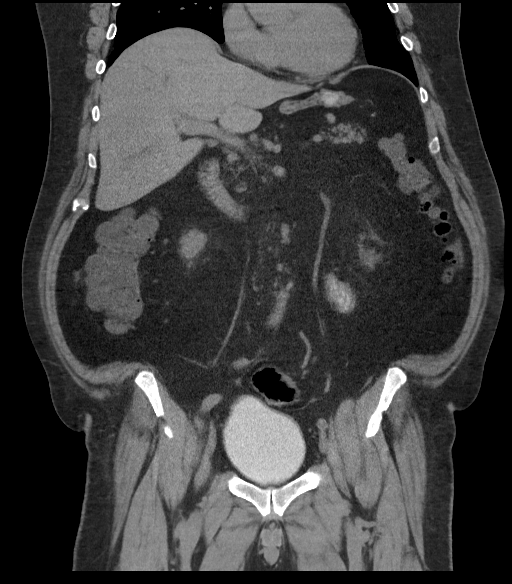

[16 of 46 positions shown; findings below may reference images not displayed]

FINDINGS: Please note that parenchymal abnormalities may be missed without
intravenous contrast.

Lower chest: No acute abnormality.  Mild cardiomegaly noted.

Hepatobiliary: The liver is unremarkable. The patient is status post
cholecystectomy. There is no evidence of biliary dilatation.

Pancreas: Unremarkable

Spleen: Unremarkable

Adrenals/Urinary Tract: The right kidney is atrophic. There is no
evidence of hydronephrosis. A probable right renal cyst is present.
The adrenal glands and bladder are unremarkable.

Stomach/Bowel: There are several dilated proximal and mid small
bowel loops with collapsed distal small bowel loops. No definite
transition point is noted but there is some stool in the small bowel
of the lower left abdomen at were there is a gradual transition
zone. There is no evidence of pneumatosis intestinalis or
pneumoperitoneum. Colonic diverticulosis noted without
diverticulitis. Gas and fluid within the colon is noted. No definite
bowel wall thickening is present. The appendix is normal.

Vascular/Lymphatic: There is no evidence of abdominal aortic
aneurysm. No enlarged lymph nodes are identified.

Reproductive: The prostate is within normal limits.

Other: No free fluid or abscess. Two small to moderate paraumbilical
hernias containing only fat are noted.

Musculoskeletal: No acute or suspicious abnormality identified.
Fusion changes at L5-S1 noted.
IMPRESSION: Dilated proximal and mid small bowel loops with collapsed distal
small bowel loops identified. No definite abrupt transition point
but this pattern suggests a partial small bowel obstruction. No
evidence of pneumoperitoneum, free fluid or abscess.

Two small to moderate paraumbilical hernias containing only fat.

Mild cardiomegaly.

Colonic diverticulosis.

## 2018-04-05 DIAGNOSIS — G2401 Drug induced subacute dyskinesia: Secondary | ICD-10-CM | POA: Diagnosis not present

## 2018-04-05 DIAGNOSIS — R259 Unspecified abnormal involuntary movements: Secondary | ICD-10-CM | POA: Diagnosis not present

## 2018-04-10 ENCOUNTER — Telehealth: Payer: Self-pay

## 2018-04-10 NOTE — Telephone Encounter (Signed)
Dr.Potter nurse called from [facility] asking that we change Oxybutynin patient is on in order for them to give an RX for Tremors that is conflicted with Oxybutynin and can not be taken together. Courtney at [facility] will expect call from [facility] tomorrow since phones are off soon there. Advised you will send response to Chassidy or me tomorrow to call back.

## 2018-04-10 NOTE — Telephone Encounter (Signed)
I did not prescribe the medication.  He was new to me last visit.  Most of his medications come from the [facility].  I do not have any details about why he takes Oxybutinin, or other medications he has taken.  Also, all other medications in that class also interact with Cogentin.

## 2018-04-11 NOTE — Telephone Encounter (Signed)
Advised that ALL of these interact with the Rx Malvin Johns wants to write. Advised that she should call [facility] or just advise Malvin Johns that no meds can be taken in that family and handle that with patient.

## 2018-05-15 DIAGNOSIS — M79644 Pain in right finger(s): Secondary | ICD-10-CM | POA: Diagnosis not present

## 2018-05-15 DIAGNOSIS — M13842 Other specified arthritis, left hand: Secondary | ICD-10-CM | POA: Diagnosis not present

## 2018-05-15 DIAGNOSIS — M79645 Pain in left finger(s): Secondary | ICD-10-CM | POA: Diagnosis not present

## 2018-07-11 DIAGNOSIS — G8918 Other acute postprocedural pain: Secondary | ICD-10-CM | POA: Diagnosis not present

## 2018-07-11 DIAGNOSIS — M13132 Monoarthritis, not elsewhere classified, left wrist: Secondary | ICD-10-CM | POA: Diagnosis not present

## 2018-07-24 DIAGNOSIS — M13842 Other specified arthritis, left hand: Secondary | ICD-10-CM | POA: Diagnosis not present

## 2018-07-31 ENCOUNTER — Ambulatory Visit: Payer: Medicare Other | Admitting: Internal Medicine

## 2018-08-07 DIAGNOSIS — M79645 Pain in left finger(s): Secondary | ICD-10-CM | POA: Diagnosis not present

## 2018-08-07 DIAGNOSIS — Z5189 Encounter for other specified aftercare: Secondary | ICD-10-CM | POA: Diagnosis not present

## 2018-08-24 DIAGNOSIS — M25642 Stiffness of left hand, not elsewhere classified: Secondary | ICD-10-CM | POA: Diagnosis not present

## 2018-08-24 DIAGNOSIS — M1812 Unilateral primary osteoarthritis of first carpometacarpal joint, left hand: Secondary | ICD-10-CM | POA: Diagnosis not present

## 2018-12-12 ENCOUNTER — Other Ambulatory Visit: Payer: Self-pay | Admitting: Internal Medicine

## 2018-12-12 ENCOUNTER — Encounter: Payer: Self-pay | Admitting: Internal Medicine

## 2018-12-12 ENCOUNTER — Other Ambulatory Visit: Payer: Self-pay

## 2018-12-12 ENCOUNTER — Ambulatory Visit (INDEPENDENT_AMBULATORY_CARE_PROVIDER_SITE_OTHER): Payer: Medicare Other | Admitting: Internal Medicine

## 2018-12-12 VITALS — BP 114/74 | HR 87 | Ht 73.0 in | Wt 344.0 lb

## 2018-12-12 DIAGNOSIS — M5136 Other intervertebral disc degeneration, lumbar region: Secondary | ICD-10-CM

## 2018-12-12 DIAGNOSIS — Z6841 Body Mass Index (BMI) 40.0 and over, adult: Secondary | ICD-10-CM

## 2018-12-12 DIAGNOSIS — I10 Essential (primary) hypertension: Secondary | ICD-10-CM

## 2018-12-12 DIAGNOSIS — G2401 Drug induced subacute dyskinesia: Secondary | ICD-10-CM | POA: Diagnosis not present

## 2018-12-12 DIAGNOSIS — F324 Major depressive disorder, single episode, in partial remission: Secondary | ICD-10-CM

## 2018-12-12 NOTE — Progress Notes (Signed)
Date:  12/12/2018   Name:  Jared Carey   DOB:  1955/10/02   MRN:  [id number]   Chief Complaint: Hypertension (Follow up[facility] )  Hypertension This is a chronic problem. The problem is controlled. Pertinent negatives include no chest pain, headaches, palpitations or shortness of breath. Past treatments include calcium channel blockers and beta blockers. The current treatment provides significant improvement.  Depression        This is a chronic ([facility]) problem.  The problem has been resolved since onset.  Associated symptoms include myalgias.  Associated symptoms include no fatigue and no headaches.  Past treatments include SNRIs - Serotonin and norepinephrine reuptake inhibitors and other medications (trazodone and venlafaxine). Back Pain This is a chronic problem. The pain is present in the lumbar spine. The quality of the pain is described as aching and burning. The pain radiates to the left foot. The pain is moderate. Associated symptoms include weakness. Pertinent negatives include no chest pain, fever or headaches. He has tried home exercises (under care of pain management; had ESI, surgery, etc) for the symptoms. Improvement on treatment: PT helped significantly.   Tardive Dyskinesia - seen by Neurology but med prescribed had [facility] ([facility]).  He did not care for Dr. Malvin JohnsPotter and would like to see his wife's neurologist in [place].  Review of Systems  Constitutional: Positive for unexpected weight change (gained back 30 lbs since retirement). Negative for chills, fatigue and fever.  HENT: Negative for trouble swallowing.   Respiratory: Negative for choking, shortness of breath and wheezing.   Cardiovascular: Positive for leg swelling. Negative for chest pain and palpitations.  Musculoskeletal: Positive for back pain, gait problem and myalgias.  Neurological: Positive for tremors and weakness. Negative  for dizziness and headaches.  Psychiatric/Behavioral: Positive for depression, dysphoric mood and sleep disturbance. The patient is nervous/anxious.     Patient Active Problem List   Diagnosis Date Noted  . Major depressive disorder with single episode, in partial remission (HCC) 12/12/2018  . Barrett's esophagus determined by endoscopy 02/28/2018  . Multiple thyroid nodules 02/28/2018  . OAB (overactive bladder) 02/28/2018  . Tardive dyskinesia 02/28/2018  . History of colon polyps 02/28/2018  . Attention deficit disorder (ADD) 02/28/2018  . Environmental and seasonal allergies 02/28/2018  . Osteoarthritis of both thumbs 02/28/2018  . Not currently working due to disabled status 02/28/2018  . Degeneration, intervertebral disc, lumbar 02/28/2018  . Constipation due to slow transit 02/28/2018  . Hyperlipidemia 02/28/2018  . GERD with esophagitis 08/07/2016  . HTN (hypertension) 08/07/2016  . Depression 08/07/2016    Allergies  Allergen Reactions  . Aripiprazole Other (See Comments)  . Benadryl [Diphenhydramine Hcl (Sleep)]   . Omeprazole Diarrhea  . Simvastatin     Past Surgical History:  Procedure Laterality Date  . BIOPSY THYROID  2018   benign  . COLONOSCOPY  09/2017   [facility]  . ESOPHAGOGASTRODUODENOSCOPY  09/2017   [facility]  . INGUINAL HERNIA REPAIR Right 1982  . LAPAROSCOPIC CHOLECYSTECTOMY  2002  . LUMBAR DISC SURGERY  1999   L5-S1 fusion  . NERVE AND TENDON REPAIR Left 1998   left elbow  . TONSILLECTOMY  1960    Social History   Tobacco Use  . Smoking status: Former Smoker    Years: 7.00    Types: Cigarettes    Quit date: 1977    Years since quitting: 43.5  . Smokeless tobacco:  Former Systems developer    Types: Chew    Quit date: 1977  Substance Use Topics  . Alcohol use: Not Currently    Comment: Quit 1985  . Drug use: Not Currently    Types: Marijuana, Methamphetamines    Comment: Quit 1984     Medication list has been reviewed and updated.  Current Meds   Medication Sig  . albuterol (PROVENTIL HFA;VENTOLIN HFA) 108 (90 BASE) MCG/ACT inhaler Inhale 2 puffs into the lungs every 6 (six) hours as needed for wheezing or shortness of breath.  Marland Kitchen amLODipine (NORVASC) 10 MG tablet Take 5 mg by mouth daily.   Marland Kitchen co-enzyme Q-10 30 MG capsule Take 30 mg by mouth 3 (three) times daily.  . cyanocobalamin 100 MCG tablet Take 100 mcg by mouth daily.  Marland Kitchen DHEA 25 MG CAPS Take 25 mg by mouth 3 (three) times daily.  Marland Kitchen docusate sodium (COLACE) 50 MG capsule Take 50 mg by mouth 4 (four) times daily.   . famotidine (PEPCID) 40 MG tablet Take 40 mg by mouth daily.  . fluticasone (FLONASE) 50 MCG/ACT nasal spray Place 1 spray into both nostrils daily.  Marland Kitchen ibuprofen (ADVIL) 800 MG tablet Take 800 mg by mouth every 8 (eight) hours as needed.  . lidocaine (XYLOCAINE) 5 % ointment Apply 1 application topically 3 (three) times daily as needed.  . methocarbamol (ROBAXIN) 750 MG tablet Take 750 mg by mouth 4 (four) times daily. PRN 4 times daily.  . Multiple Vitamins-Minerals (MULTIVITAMIN WITH MINERALS) tablet Take 1 tablet by mouth daily.  . Omega-3 Fatty Acids (FISH OIL) 1000 MG CAPS Take by mouth.  . oxybutynin (DITROPAN-XL) 10 MG 24 hr tablet Take 20 mg by mouth 2 (two) times daily.  . propranolol (INDERAL) 40 MG tablet Take 20 mg by mouth 2 (two) times daily.   . traZODone (DESYREL) 100 MG tablet Take 100 mg by mouth at bedtime.  Marland Kitchen venlafaxine (EFFEXOR) 75 MG tablet Take 75 mg by mouth 3 (three) times daily with meals.    PHQ 2/9 Scores 12/12/2018 02/28/2018  PHQ - 2 Score 3 3  PHQ- 9 Score 14 13    BP Readings from Last 3 Encounters:  12/12/18 114/74  02/28/18 104/78  05/07/17 (!) 150/91    Physical Exam Vitals signs and nursing note reviewed.  Constitutional:      General: He is not in acute distress.    Appearance: He is well-developed. He is obese.  HENT:     Head: Normocephalic and atraumatic.  Eyes:     Pupils: Pupils are equal, round, and reactive to  light.  Cardiovascular:     Rate and Rhythm: Normal rate and regular rhythm.  Pulmonary:     Effort: Pulmonary effort is normal. No respiratory distress.     Breath sounds: No wheezing or rhonchi.  Musculoskeletal:     Lumbar back: He exhibits bony tenderness. He exhibits no spasm.     Right lower leg: Edema present.     Left lower leg: Edema present.  Lymphadenopathy:     Cervical: No cervical adenopathy.  Skin:    General: Skin is warm and dry.     Findings: No rash.  Neurological:     Mental Status: He is alert and oriented to person, place, and time.  Psychiatric:        Attention and Perception: Attention normal.        Mood and Affect: Mood is depressed.        Speech:  Speech normal.        Behavior: Behavior normal.        Thought Content: Thought content normal.     Wt Readings from Last 3 Encounters:  12/12/18 (!) 344 lb (156 kg)  02/28/18 (!) 331 lb (150.1 kg)  05/07/17 300 lb (136.1 kg)    BP 114/74   Pulse 87   Ht 6\' 1"  (1.854 m)   Wt (!) 344 lb (156 kg)   SpO2 96%   BMI 45.39 kg/m   Assessment and Plan: 1. Tardive dyskinesia [place]ulatory referral to Neurology  2. Degeneration, intervertebral disc, lumbar [facility] but [facility] - Ambulatory referral to Physical Therapy  3. Essential hypertension controlled  4. Major depressive disorder with single episode, in partial remission (HCC) Continue current meds and psych follow up  5. BMI 45.0-49.9, adult [facility](HCC) Discussed the possibility of trying intermittent fasting to help lose weight since he is unable to exercise   Partially dictated using Dragon software. Any errors are unintentional.  Bari EdwardLaura Abi Shoults, MD [facility]  12/12/2018

## 2018-12-20 ENCOUNTER — Encounter (HOSPITAL_COMMUNITY): Payer: Self-pay | Admitting: Vascular Surgery

## 2018-12-20 NOTE — Progress Notes (Signed)
Anesthesia Note:  Case: [id number] Date/Time: 01/02/19 1400   Procedure: Right thumb carpal metacarpal arthroplasty with reapir reconstruction and double tendon transfer as necessary (Right ) - 2 hrs Pt needs pre op anesthesia consult   Anesthesia type: General   Pre-op diagnosis: Right thumb carpal metacarpal arthritis   Location: [place] [facility] [place] OR   Surgeon: Roseanne Kaufman, MD     Anesthesia Consult requested: PAT visit scheduled for 12/29/2018. Will update note below following evaluation.    DISCUSSION: Patient is a 63 year old male scheduled for the above procedure.  History includes former smoker (quit 1977), anxiety, major depressive disorder, ADD, HTN, edema, renal infarct, overactive bladder, incontinence, anemia, obesity, tardive dyskinesia, chest pain (2018), thyroid disease (thyroido nodules, stable by 10/21/17 [place] at [facility]). Chest pain admission 07/2016 with unremarkable EKG, troponin, and echo and was diagnosed with partial small bowel obstruction with two small-moderate size paraumbilical hernias noted on CT--conservative management recommended by general surgery.   According to records reviewed, Mr. Mccabe took [place] from 2010-2012 and began having involuntary movements after he quit taking it and was diagnosed with tardive dyskinesia. Symptoms have progressively worsened over the past eight years. Movements are located in his hands, arms, mouth, legs, and feet. He has difficultly with balance when he stands. Symptoms tend to be worse in the morning and when trying to sit still.  His walking, riding, and drinking have been affected. He saw neurologist Dr. Melrose Nakayama on 04/05/18 with plans to start Cogentin; however, it looks like this may not have been started due to interaction with his oxybutynin (anticholinergic used for overactive bladder/urinary spasms). He has a pending appointment with Dr. Jannifer Franklin in August 2020.   ASA instructions:    VS: There were no vitals taken for this  visit.    PROVIDERS: Beacher May, MD is listed as PCP with [place]. He is also seen by Halina Maidens, MD [facility]), last on 12/12/18. Neurologist was Gurney Maxin, MD [facility], [facility]) but he has an upcoming appointment with Margette Fast, MD at [facility] on 01/18/19.    LABS: PENDING PAT visit. (all labs ordered are listed, but only abnormal results are displayed)  Labs Reviewed - No data to display   EKG: Pending PAT visit.    CV:  Echo 08/07/16: Study Conclusions - Left ventricle: Wall thickness was increased in a pattern of mild   LVH. Systolic function was normal. The estimated ejection   fraction was in the range of 55% to 60%. - Mitral valve: There was mild regurgitation. - Right ventricle: The cavity size was mildly dilated.  By notes, he had a previous cardiac cath at the [facility] that showed only 20% 1V stenosis. May have been ~ 2016, will need to clarify with patient and request records if available.    Past Medical History:  Diagnosis Date  . ADD (attention deficit disorder)   . Allergy   . Anemia   . Anxiety   . Barrett esophagus   . Chest pain 08/07/2016  . Colon polyps   . Depression   . Diverticulosis   . Dysphagia   . Edema   . GERD (gastroesophageal reflux disease)   . Hypertension   . Incontinence    urge  . Major depressive disorder   . Obesity   . Obesity   . Osteoarthritis of thumbs, bilateral   . Overactive bladder   . Renal infarction ([place])   . Tardive akathisia   . Tardive  dyskinesia   . Thoracic stomach hernia   . Thyroid disease   . Thyroid nodule   . Venous insufficiency     Past Surgical History:  Procedure Laterality Date  . BIOPSY THYROID  2018   benign  . COLONOSCOPY  09/2017   [facility]  . ESOPHAGOGASTRODUODENOSCOPY  09/2017   [facility]  . INGUINAL HERNIA REPAIR Right 1982  . LAPAROSCOPIC CHOLECYSTECTOMY  2002  . LUMBAR DISC SURGERY  1999   L5-S1 fusion  . NERVE AND TENDON  REPAIR Left 1998   left elbow  . TONSILLECTOMY  1960    MEDICATIONS: . albuterol (PROVENTIL HFA;VENTOLIN HFA) 108 (90 BASE) MCG/ACT inhaler  . amLODipine (NORVASC) 10 MG tablet  . aspirin 325 MG tablet  . calcium-vitamin D (OSCAL WITH D) 500-200 MG-UNIT tablet  . co-enzyme Q-10 30 MG capsule  . cyanocobalamin 100 MCG tablet  . DHEA 25 MG CAPS  . docusate sodium (COLACE) 50 MG capsule  . famotidine (PEPCID) 40 MG tablet  . fluticasone (FLONASE) 50 MCG/ACT nasal spray  . ibuprofen (ADVIL) 800 MG tablet  . lidocaine (XYLOCAINE) 5 % ointment  . Melatonin 10 MG TABS  . methocarbamol (ROBAXIN) 750 MG tablet  . Multiple Vitamins-Minerals (MULTIVITAMIN WITH MINERALS) tablet  . Omega-3 Fatty Acids (FISH OIL) 1000 MG CAPS  . oxybutynin (DITROPAN-XL) 10 MG 24 hr tablet  . oxyCODONE (OXY IR/ROXICODONE) 5 MG immediate release tablet  . propranolol (INDERAL) 40 MG tablet  . traZODone (DESYREL) 100 MG tablet  . venlafaxine (EFFEXOR) 75 MG tablet   No current facility-administered medications for this encounter.     Shonna ChockAllison Loye Vento, PA-C Surgical Short Stay/Anesthesiology [facility] Phone [phone] [facility] Phone [phone] 12/20/2018 6:01 PM

## 2018-12-29 ENCOUNTER — Other Ambulatory Visit (HOSPITAL_COMMUNITY): Payer: Medicaid Other

## 2018-12-29 ENCOUNTER — Inpatient Hospital Stay (HOSPITAL_COMMUNITY)
Admission: RE | Admit: 2018-12-29 | Discharge: 2018-12-29 | Disposition: A | Discharge status: Home / Self Care | Payer: Medicaid Other | Source: Ambulatory Visit

## 2019-01-02 ENCOUNTER — Ambulatory Visit: Admit: 2019-01-02 | Payer: Medicaid Other | Admitting: Orthopedic Surgery

## 2019-01-02 SURGERY — CARPOMETACARPEL (CMC) SUSPENSION PLASTY
Anesthesia: General | Laterality: Right

## 2019-01-18 ENCOUNTER — Other Ambulatory Visit: Payer: Self-pay

## 2019-01-18 ENCOUNTER — Encounter: Payer: Self-pay | Admitting: Neurology

## 2019-01-18 ENCOUNTER — Ambulatory Visit (INDEPENDENT_AMBULATORY_CARE_PROVIDER_SITE_OTHER): Payer: Medicare Other | Admitting: Neurology

## 2019-01-18 DIAGNOSIS — G2571 Drug induced akathisia: Secondary | ICD-10-CM

## 2019-01-18 HISTORY — DX: Drug induced akathisia: G25.71

## 2019-01-18 MED ORDER — PROPRANOLOL HCL 40 MG PO TABS: 40.0000 mg | ORAL_TABLET | Freq: Two times a day (BID) | ORAL | 3 refills | Status: DC

## 2019-01-18 NOTE — Progress Notes (Signed)
Reason for visit: Movement disorder  Referring physician: Dr. Rod MaeBerglund  Jared Carey is a 63 y.o. male  History of present illness:  Mr. 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  He was placed on Abilify to suppress hallucinations and panic attacks that were occurring at that time.  He was on Abilify for about 2 years and then stopped the medication and then developed a movement disorder that has been diagnosed as tardive dyskinesia.  The patient feels fidgety, he has to move his hands and legs at all times, if he tries to stop moving he feels a sensation of restlessness that will not allow him to stay completely still.  He may rock back and forth, he may move his legs back and forth and wiggle his fingers.  The movement issue does not keep him awake at night, he sleeps fairly well at night.  He also has a significant problem with lumbosacral spinal stenosis, he has been considered for surgery but he is markedly overweight and they will not do surgery unless he loses weight.  If he walks more than 60 feet, he may have increased pain down the legs and he has to stop and rest, he has pain down the left greater than right leg.  He has numbness in the feet, left greater than right, he denies any numbness in the hands.  He reports some weakness in both legs.  He has fallen on 3 occasions within the last year.  Uses a walker for ambulation.  He is on propranolol taking 20 mg twice daily, he tolerates this very well.  He does have occasional dizziness with standing but he denies any blackouts.  He denies headaches.  He has not had any problems with chewing or swallowing.  He is sent to this office for an evaluation.  The patient is no longer having any panic attacks at this point.  Past Medical History:  Diagnosis Date  . ADD (attention deficit disorder)   . Allergy   . Anemia   . Anxiety   . Barrett esophagus   . Chest pain  08/07/2016  . Colon polyps   . Depression   . Diverticulosis   . Dysphagia   . Edema   . GERD (gastroesophageal reflux disease)   . Hypertension   . Incontinence    urge  . Major depressive disorder   . Obesity   . Obesity   . Osteoarthritis of thumbs, bilateral   . Overactive bladder   . Renal infarction ([facility])   . Tardive akathisia   . Tardive dyskinesia   . Thoracic stomach hernia   . Thyroid disease   . Thyroid nodule   . Venous insufficiency     Past Surgical History:  Procedure Laterality Date  . BIOPSY THYROID  2018   benign  . COLONOSCOPY  09/2017   [facility]  . ESOPHAGOGASTRODUODENOSCOPY  09/2017   [facility]  . INGUINAL HERNIA REPAIR Right 1982  . LAPAROSCOPIC CHOLECYSTECTOMY  2002  . LUMBAR DISC SURGERY  1999   L5-S1 fusion  . NERVE AND TENDON REPAIR Left 1998   left elbow  . TONSILLECTOMY  1960    Family History  Problem Relation Age of Onset  . Pneumonia Mother   . Mitral valve prolapse Mother   . Parkinsonism Mother   . Diabetes Mother   . Depression Mother   . Hyperthyroidism Mother   .  Heart attack Father   . Bladder Cancer Father   . Hyperthyroidism Father   . Heart disease Father   . Breast cancer Sister   . Colon cancer Brother     Social history:  reports that he quit smoking about 43 years ago. His smoking use included cigarettes. He quit after 7.00 years of use. He quit smokeless tobacco use about 43 years ago.  His smokeless tobacco use included chew. He reports previous alcohol use. He reports previous drug use. Drugs: Marijuana and Methamphetamines.  Medications:  Prior to Admission medications   Medication Sig Start Date End Date Taking? Authorizing Provider  albuterol (PROVENTIL HFA;VENTOLIN HFA) 108 (90 BASE) MCG/ACT inhaler Inhale 2 puffs into the lungs every 6 (six) hours as needed for wheezing or shortness of breath.   Yes [provider]  amLODipine (NORVASC) 10 MG tablet Take 5 mg by mouth daily.    Yes [provider]  aspirin 325 MG tablet Take 975 mg by mouth every 6 (six) hours as needed.   Yes [provider]  co-enzyme Q-10 30 MG capsule Take 30 mg by mouth 3 (three) times daily.   Yes [provider]  cyanocobalamin 100 MCG tablet Take 100 mcg by mouth daily.   Yes [provider]  DHEA 25 MG CAPS Take 25 mg by mouth 3 (three) times daily.   Yes [provider]  docusate sodium (COLACE) 50 MG capsule Take 50 mg by mouth 4 (four) times daily.    Yes [provider]  famotidine (PEPCID) 40 MG tablet Take 40 mg by mouth daily.   Yes [provider]  fluticasone (FLONASE) 50 MCG/ACT nasal spray Place 1 spray into both nostrils daily.   Yes [provider]  ibuprofen (ADVIL) 800 MG tablet Take 800 mg by mouth every 8 (eight) hours as needed.   Yes [provider]  lidocaine (XYLOCAINE) 5 % ointment Apply 1 application topically 3 (three) times daily as needed.   Yes [provider]  loratadine (CLARITIN) 10 MG tablet Take 10 mg by mouth daily.   Yes [provider]  Multiple Vitamins-Minerals (MULTIVITAMIN WITH MINERALS) tablet Take 1 tablet by mouth daily.   Yes [provider]  Omega-3 Fatty Acids (FISH OIL) 1000 MG CAPS Take by mouth.   Yes [provider]  oxybutynin (DITROPAN-XL) 10 MG 24 hr tablet Take 20 mg by mouth 2 (two) times daily.   Yes [provider]  propranolol (INDERAL) 40 MG tablet Take 20 mg by mouth 2 (two) times daily.    Yes [provider]  traZODone (DESYREL) 100 MG tablet Take 100 mg by mouth at bedtime.   Yes [provider]  venlafaxine (EFFEXOR) 75 MG tablet Take 75 mg by mouth 3 (three) times daily with meals.   Yes [provider]  calcium-vitamin D (OSCAL WITH D) 500-200 MG-UNIT tablet Take 1 tablet by mouth.    [provider]      Allergies  Allergen Reactions  . Aripiprazole Other (See Comments)  . Benadryl  [Diphenhydramine Hcl (Sleep)]   . Omeprazole Diarrhea  . Robaxin [Methocarbamol] Rash  . Simvastatin     ROS:  Out of a complete 14 system review of symptoms, the patient complains only of the following symptoms, and all other reviewed systems are negative.  Fatigue Swelling in the legs Diarrhea Increased thirst Allergies Memory loss Depression, anxiety, decreased energy, disinterest in activities Restless legs  Blood pressure 119/82,  pulse 78, temperature 98.7 F (37.1 C), height 6\' 1"  (1.854 m), weight (!) 329 lb (149.2 kg).  Physical Exam  General: The patient is alert and cooperative at the time of the examination.  Eyes: Pupils are equal, round, and reactive to light. Discs are flat bilaterally.  Neck: The neck is supple, no carotid bruits are noted.  Respiratory: The respiratory examination is clear.  Cardiovascular: The cardiovascular examination reveals a regular rate and rhythm, no obvious murmurs or rubs are noted.  Skin: Extremities are with 1-2+ edema below the knees.  Neurologic Exam  Mental status: The patient is alert and oriented x 3 at the time of the examination. The patient has apparent normal recent and remote memory, with an apparently normal attention span and concentration ability.  Cranial nerves: Facial symmetry is present. There is good sensation of the face to pinprick and soft touch bilaterally. The strength of the facial muscles and the muscles to head turning and shoulder shrug are normal bilaterally. Speech is well enunciated, no aphasia or dysarthria is noted. Extraocular movements are full. Visual fields are full. The tongue is midline, and the patient has symmetric elevation of the soft palate. No obvious hearing deficits are noted.  Motor: The motor testing reveals 5 over 5 strength of all 4 extremities. Good symmetric motor tone is noted throughout.  Sensory: Sensory testing is intact to pinprick, soft touch, vibration sensation, and  position sense on the upper extremities.  With the lower extremities, there is a stocking pattern pinprick sensory deficit across the ankles bilaterally, there is impairment of vibration sensation in the left greater than right foot and impairment of position sense in both feet.  No evidence of extinction is noted.  Coordination: Cerebellar testing reveals good finger-nose-finger and heel-to-shin bilaterally.  Gait and station: Gait is slightly wide-based, the patient walks with a walker.  Tandem gait was not attempted.  Romberg is negative, but is unsteady.. No drift is seen.  Reflexes: Deep tendon reflexes are symmetric, but are depressed bilaterally. Toes are downgoing bilaterally.   Assessment/Plan:  1.  Movement disorder, akathisia  2.  Low back pain, lumbar spinal stenosis  3.  Sensory alteration in the feet, possible peripheral neuropathy versus lumbar spinal stenosis  4.  Gait disorder  The clinical examination is not consistent with tardive dyskinesia.  The patient appears to have a syndrome of akathisia.  The treatment of choice is to use propranolol, we will go on a higher dose of propranolol taking 40 mg twice daily.  He will call for any dose adjustments.  We may need to add other medication such as mirtazapine or clonazepam in the future depending on his response to propranolol.  He will follow-up otherwise in 6 months.  Cessation of Effexor and trazodone in the future may need to be done.  Jill Alexanders MD 01/18/2019 11:37 AM  [facility] [address] [place], [place] [address]  Phone [phone] Fax [phone]

## 2019-01-18 NOTE — Patient Instructions (Addendum)
We will go up on the propranolol to 40 mg twice a day.

## 2019-07-31 ENCOUNTER — Ambulatory Visit: Payer: Medicare Other | Admitting: Neurology

## 2019-08-13 ENCOUNTER — Ambulatory Visit (INDEPENDENT_AMBULATORY_CARE_PROVIDER_SITE_OTHER): Payer: Medicare Other | Admitting: Neurology

## 2019-08-13 ENCOUNTER — Encounter: Payer: Self-pay | Admitting: Neurology

## 2019-08-13 ENCOUNTER — Other Ambulatory Visit: Payer: Self-pay

## 2019-08-13 VITALS — BP 145/93 | HR 66 | Temp 97.8°F | Ht 73.0 in | Wt 344.0 lb

## 2019-08-13 DIAGNOSIS — G2571 Drug induced akathisia: Secondary | ICD-10-CM | POA: Diagnosis not present

## 2019-08-13 MED ORDER — CLONAZEPAM 0.5 MG PO TABS: 0.5000 mg | ORAL_TABLET | Freq: Two times a day (BID) | ORAL | 3 refills | Status: DC

## 2019-08-13 NOTE — Progress Notes (Signed)
Reason for visit: Acathisia  Jared Carey is an 64 y.o. male  History of present illness:  Jared Carey is a 64 year old right-handed white male with a history of chronic low back pain, obesity, and acathisia that has been present since 2013.  The patient had been increased on his propranolol taking 40 mg twice daily but this offers only minimal benefit.  The patient sleeps in the recliner, he does not know whether or not he snores at night, he has fatigue during the day but no drowsiness per se, he is very limited in the amount of physical activity he can do.  He does have some balance issues, he uses a cane for ambulation.  He has had 1 fall since last seen.  He gets most of his therapy through [facility], he has been involved in a weight loss program but this was suspended when the Covid pandemic occurred.  He claims that he started Effexor and trazodone fairly recently, in the summer 2020.  Past Medical History:  Diagnosis Date  . ADD (attention deficit disorder)   . Akathisia 01/18/2019  . Allergy   . Anemia   . Anxiety   . Barrett esophagus   . Chest pain 08/07/2016  . Colon polyps   . Depression   . Diverticulosis   . Dysphagia   . Edema   . GERD (gastroesophageal reflux disease)   . Hypertension   . Incontinence    urge  . Major depressive disorder   . Obesity   . Obesity   . Osteoarthritis of thumbs, bilateral   . Overactive bladder   . Renal infarction ([facility])   . Tardive akathisia   . Tardive dyskinesia   . Thoracic stomach hernia   . Thyroid disease   . Thyroid nodule   . Venous insufficiency     Past Surgical History:  Procedure Laterality Date  . BIOPSY THYROID  2018   benign  . COLONOSCOPY  09/2017   [facility]  . ESOPHAGOGASTRODUODENOSCOPY  09/2017   [facility]  . INGUINAL HERNIA REPAIR Right 1982  . LAPAROSCOPIC CHOLECYSTECTOMY  2002  . LUMBAR DISC SURGERY  1999   L5-S1 fusion  . NERVE AND TENDON REPAIR Left 1998   left elbow  . TONSILLECTOMY  1960     Family History  Problem Relation Age of Onset  . Pneumonia Mother   . Mitral valve prolapse Mother   . Parkinsonism Mother   . Diabetes Mother   . Depression Mother   . Hyperthyroidism Mother   . Heart attack Father   . Bladder Cancer Father   . Hyperthyroidism Father   . Heart disease Father   . Breast cancer Sister   . Colon cancer Brother     Social history:  reports that he quit smoking about 44 years ago. His smoking use included cigarettes. He quit after 7.00 years of use. He quit smokeless tobacco use about 44 years ago.  His smokeless tobacco use included chew. He reports previous alcohol use. He reports previous drug use. Drugs: Marijuana and Methamphetamines.    Allergies  Allergen Reactions  . Aripiprazole Other (See Comments)  . Flexeril [Cyclobenzaprine]   . Benadryl [Diphenhydramine Hcl (Sleep)]   . Omeprazole Diarrhea  . Robaxin [Methocarbamol] Rash  . Simvastatin     Medications:  Prior to Admission medications   Medication Sig Start Date End Date Taking? Authorizing Provider  albuterol (PROVENTIL HFA;VENTOLIN HFA) 108 (90 BASE) MCG/ACT inhaler Inhale 2 puffs into  the lungs every 6 (six) hours as needed for wheezing or shortness of breath.    [provider]  amLODipine (NORVASC) 10 MG tablet Take 5 mg by mouth daily.     [provider]  aspirin 325 MG tablet Take 975 mg by mouth every 6 (six) hours as needed.    [provider]  calcium-vitamin D (OSCAL WITH D) 500-200 MG-UNIT tablet Take 1 tablet by mouth.    [provider]  co-enzyme Q-10 30 MG capsule Take 30 mg by mouth 3 (three) times daily.    [provider]  cyanocobalamin 100 MCG tablet Take 100 mcg by mouth daily.    [provider]  DHEA 25 MG CAPS Take 25 mg by mouth 3 (three) times daily.    [provider]  docusate sodium (COLACE) 50 MG capsule Take 50 mg by mouth 4 (four) times daily.     [provider]  famotidine  (PEPCID) 40 MG tablet Take 40 mg by mouth daily.    [provider]  fluticasone (FLONASE) 50 MCG/ACT nasal spray Place 1 spray into both nostrils daily.    [provider]  ibuprofen (ADVIL) 800 MG tablet Take 800 mg by mouth every 8 (eight) hours as needed.    [provider]  lidocaine (XYLOCAINE) 5 % ointment Apply 1 application topically 3 (three) times daily as needed.    [provider]  loratadine (CLARITIN) 10 MG tablet Take 10 mg by mouth daily.    [provider]  Multiple Vitamins-Minerals (MULTIVITAMIN WITH MINERALS) tablet Take 1 tablet by mouth daily.    [provider]  Omega-3 Fatty Acids (FISH OIL) 1000 MG CAPS Take by mouth.    [provider]  oxybutynin (DITROPAN-XL) 10 MG 24 hr tablet Take 20 mg by mouth 2 (two) times daily.    [provider]  propranolol (INDERAL) 40 MG tablet Take 1 tablet (40 mg total) by mouth 2 (two) times daily. 01/18/19   York Spaniel, MD  traZODone (DESYREL) 100 MG tablet Take 100 mg by mouth at bedtime.    [provider]  venlafaxine (EFFEXOR) 75 MG tablet Take 75 mg by mouth 3 (three) times daily with meals.    [provider]    ROS:  Out of a complete 14 system review of symptoms, the patient complains only of the following symptoms, and all other reviewed systems are negative.  Fatigue Walking difficulty Restlessness Balance problems  Blood pressure (!) 145/93, pulse 66, temperature 97.8 F (36.6 C), height 6\' 1"  (1.854 m), weight (!) 344 lb (156 kg).  Physical Exam  General: The patient is alert and cooperative at the time of the examination.  The patient is markedly obese.  Skin: No significant peripheral edema is noted.   Neurologic Exam  Mental status: The patient is alert and oriented x 3 at the time of the examination. The patient has apparent normal recent and remote memory, with an apparently normal attention span and  concentration ability.   Cranial nerves: Facial symmetry is present. Speech is normal, no aphasia or dysarthria is noted. Extraocular movements are full. Visual fields are full.  Motor: The patient has good strength in all 4 extremities.  Sensory examination: Soft touch sensation is symmetric on the face, arms, and legs.  Coordination: The patient has good finger-nose-finger and heel-to-shin bilaterally.  Gait and station: The patient has a wide-based gait, he walks with a cane.  Tandem gait is  not attempted.  Romberg is negative but is unsteady.  Reflexes: Deep tendon reflexes are symmetric.   Assessment/Plan:  1.  Acathisia  2.  Chronic low back pain  3.  Obesity  4.  Gait instability  The patient has not gained significant benefit with the propranolol, we will add clonazepam in low-dose taking 0.5 mg twice daily.  The patient will contact me for any dose adjustments, otherwise he will follow-up in 6 months.  Jill Alexanders MD 08/13/2019 7:43 AM  [facility] [address] [place], [place] [address]  Phone [phone] Fax [phone]

## 2019-08-13 NOTE — Patient Instructions (Signed)
We will start clonazepam 0.5 mg twice a day. Look out for drowsiness or gait instability.

## 2019-09-18 ENCOUNTER — Other Ambulatory Visit: Payer: Self-pay | Admitting: Neurology

## 2019-12-06 ENCOUNTER — Other Ambulatory Visit: Payer: Self-pay | Admitting: Neurology

## 2020-02-13 ENCOUNTER — Ambulatory Visit (INDEPENDENT_AMBULATORY_CARE_PROVIDER_SITE_OTHER): Payer: Medicare Other | Admitting: Neurology

## 2020-02-13 ENCOUNTER — Other Ambulatory Visit: Payer: Self-pay

## 2020-02-13 ENCOUNTER — Encounter: Payer: Self-pay | Admitting: Neurology

## 2020-02-13 VITALS — BP 132/87 | HR 71 | Ht 74.0 in | Wt 347.0 lb

## 2020-02-13 DIAGNOSIS — G2571 Drug induced akathisia: Secondary | ICD-10-CM

## 2020-02-13 DIAGNOSIS — G4489 Other headache syndrome: Secondary | ICD-10-CM | POA: Diagnosis not present

## 2020-02-13 MED ORDER — PREDNISONE 5 MG PO TABS
ORAL_TABLET | ORAL | 0 refills | Status: DC
Start: 2020-02-13 — End: 2021-03-09

## 2020-02-13 MED ORDER — CLONAZEPAM 0.5 MG PO TABS: 0.5000 mg | ORAL_TABLET | Freq: Three times a day (TID) | ORAL | 1 refills | Status: DC

## 2020-02-13 NOTE — Patient Instructions (Signed)
We will increase the clonazepam to 0.5 mg three times a day.  Use the prednisone for the head pain, call if this does not resolve.

## 2020-02-13 NOTE — Progress Notes (Signed)
Reason for visit: Akathisia  Jared Carey is an 64 y.o. male  History of present illness:  Mr. Jared Carey is a 64 year old right-handed white male with a history of acathisia.  The patient did not get much benefit from propranolol, he was placed on clonazepam and had a 70% improvement in his problems initially, but this effect has worn off some.  He is tolerating the clonazepam well.  He is sleeping well at night.  He has a restless fidgety feeling during the day, he can suppress the movements voluntarily but not for long.  He also has developed a new problem, over the last 3 weeks he has had sharp shooting pains coming up from the base of the skull on either side, with pain going to the top of the head and forward into the temporal region and retro-orbital area.  The pain lasted about 10 seconds and then clears.  It is not brought on by any neck movement.  He denies any neck pain or shoulder discomfort.  He has no stiffness of the neck.  The patient may have pain off and on throughout the day.  He comes to the office today for further evaluation.  Past Medical History:  Diagnosis Date  . ADD (attention deficit disorder)   . Akathisia 01/18/2019  . Allergy   . Anemia   . Anxiety   . Barrett esophagus   . Chest pain 08/07/2016  . Colon polyps   . Depression   . Diverticulosis   . Dysphagia   . Edema   . GERD (gastroesophageal reflux disease)   . Hypertension   . Incontinence    urge  . Major depressive disorder   . Obesity   . Obesity   . Osteoarthritis of thumbs, bilateral   . Overactive bladder   . Renal infarction (HCC)   . Tardive akathisia   . Tardive dyskinesia   . Thoracic stomach hernia   . Thyroid disease   . Thyroid nodule    benign  . Venous insufficiency     Past Surgical History:  Procedure Laterality Date  . BIOPSY THYROID  2018   benign  . COLONOSCOPY  09/2017   [facility]  . ESOPHAGOGASTRODUODENOSCOPY  09/2017   [facility]  . INGUINAL HERNIA REPAIR Right 1982  .  LAPAROSCOPIC CHOLECYSTECTOMY  2002  . LUMBAR DISC SURGERY  1999   L5-S1 fusion  . NERVE AND TENDON REPAIR Left 1998   left elbow  . TONSILLECTOMY  1960    Family History  Problem Relation Age of Onset  . Pneumonia Mother   . Mitral valve prolapse Mother   . Parkinsonism Mother   . Diabetes Mother   . Depression Mother   . Hyperthyroidism Mother   . Heart attack Father   . Bladder Cancer Father   . Hyperthyroidism Father   . Heart disease Father   . Breast cancer Sister   . Colon cancer Brother     Social history:  reports that he quit smoking about 44 years ago. His smoking use included cigarettes. He quit after 7.00 years of use. He quit smokeless tobacco use about 44 years ago.  His smokeless tobacco use included chew. He reports previous alcohol use. He reports previous drug use. Drugs: Marijuana and Methamphetamines.    Allergies  Allergen Reactions  . Aripiprazole Other (See Comments)  . Flexeril [Cyclobenzaprine]   . Benadryl [Diphenhydramine Hcl (Sleep)]   . Omeprazole Diarrhea  . Robaxin [Methocarbamol] Rash  . Simvastatin  Medications:  Prior to Admission medications   Medication Sig Start Date End Date Taking? Authorizing Provider  albuterol (PROVENTIL HFA;VENTOLIN HFA) 108 (90 BASE) MCG/ACT inhaler Inhale 2 puffs into the lungs every 6 (six) hours as needed for wheezing or shortness of breath.    [provider]  aspirin 325 MG tablet Take 325 mg by mouth every 6 (six) hours as needed.     [provider]  clonazePAM (KLONOPIN) 0.5 MG tablet TAKE 1 TABLET(0.5 MG) BY MOUTH TWICE DAILY 12/10/19   Sater, Pearletha Furl, MD  fluticasone (FLONASE) 50 MCG/ACT nasal spray Place 1 spray into both nostrils daily.    [provider]  lidocaine (XYLOCAINE) 5 % ointment Apply 1 application topically 3 (three) times daily as needed.    [provider]  loratadine (CLARITIN) 10 MG tablet Take 10 mg by mouth daily.    [provider]   Multiple Vitamins-Minerals (MULTIVITAMIN WITH MINERALS) tablet Take 1 tablet by mouth daily.    [provider]  oxybutynin (DITROPAN-XL) 10 MG 24 hr tablet Take 20 mg by mouth 2 (two) times daily.    [provider]  Probiotic Product (PROBIOTIC PO) Take by mouth.    [provider]  propranolol (INDERAL) 40 MG tablet TAKE 1 TABLET(40 MG) BY MOUTH TWICE DAILY 09/18/19   York Spaniel, MD  traZODone (DESYREL) 100 MG tablet Take 100 mg by mouth at bedtime.    [provider]  VALERIAN ROOT PO Take by mouth.    [provider]  venlafaxine (EFFEXOR) 75 MG tablet Take 75 mg by mouth 3 (three) times daily with meals.    [provider]    ROS:  Out of a complete 14 system review of symptoms, the patient complains only of the following symptoms, and all other reviewed systems are negative.  Head pain Restlessness Arthritis  Blood pressure 132/87, pulse 71, height 6\' 2"  (1.88 m), weight (!) 347 lb (157.4 kg).  Physical Exam  General: The patient is alert and cooperative at the time of the examination.  The patient is markedly obese.  Neuromuscular: Range move the cervical spine is relatively full.  Skin: No significant peripheral edema is noted.   Neurologic Exam  Mental status: The patient is alert and oriented x 3 at the time of the examination. The patient has apparent normal recent and remote memory, with an apparently normal attention span and concentration ability.   Cranial nerves: Facial symmetry is present. Speech is normal, no aphasia or dysarthria is noted. Extraocular movements are full. Visual fields are full.  Motor: The patient has good strength in all 4 extremities.  Sensory examination: Soft touch sensation is symmetric on the face, arms, and legs.  Coordination: The patient has good finger-nose-finger and heel-to-shin bilaterally.  Gait and station: The patient has a wide-based gait, he has difficulty  getting up out of a chair.  He normally walks with a walker.  Reflexes: Deep tendon reflexes are symmetric.   Assessment/Plan:  1. Akathisia  2.  Bilateral occipital neuralgia, new onset  3.  Gait disorder  The patient has gained benefit with the clonazepam, but he may require higher dose to fully control his symptoms.  We will go up on the clonazepam to 0.5 mg 3 times daily.  A prescription was sent in.  He is now having a new type of headache pain that is simulating occipital neuralgia.  We will check a sedimentation rate.  I will give him  a 6-day course of prednisone but if the pain returns, we will initiate gabapentin therapy and set him up for MRI of the brain.  He will follow-up otherwise in 6 months.  Marlan Palau MD 02/13/2020 11:03 AM  [facility] [address] [place], [place] [address]  Phone [phone] Fax [phone]

## 2020-02-14 LAB — SEDIMENTATION RATE: Sed Rate: 7 mm/hr (ref 0–30)

## 2020-02-19 ENCOUNTER — Telehealth: Payer: Self-pay | Admitting: Neurology

## 2020-02-19 NOTE — Telephone Encounter (Signed)
Pt called, after finishing predniSONE (DELTASONE) 5 MG tablet went from intermittent sharp pains to long headaches and mood changes; angry all the time. Pt would like a call from the nurse.

## 2020-02-19 NOTE — Telephone Encounter (Signed)
I called the pt and relayed Dr. Teofilo Pod message. Pt was made aware Dr. Anne Hahn is out of the office this week. Pt verbalized understanding and is agreeable to waiting until Dr. Anne Hahn can review and advise on.  Pt reports he will check with PCP about the changes in his mood.

## 2020-02-19 NOTE — Telephone Encounter (Signed)
Please advise patient that the prednisone takes several days to take effect.  I would not favor initiating any new medications quite yet.  We can certainly consider gabapentin next week if he continues to have headaches.  Prednisone takes effect slowly and the effect also lasts longer than the actual medication regimen.  Please advise patient keep [place] posted, he can give [place] an update later this week or early next week.  If he has any mood related concerns, I would recommend he check with his primary care physician for any interim advice, or with his mental health provider if he has one.   Dr. Anne Hahn had mentioned a trial of gabapentin but I would be cautious with this at this very moment. Gabapentin can also cause mood related side effects.

## 2020-02-19 NOTE — Telephone Encounter (Signed)
Pt called to report after finishing the prednisone his h/a went form sharp shooting intermittent pains to long lasting h/a and he also noticed he has been angry more.   Per Dr. Anne Hahn note from 02/13/2020 "I will give him a 6-day course of prednisone but if the pain returns, we will initiate gabapentin therapy and set him up for MRI of the brain.  He will follow-up otherwise in 6 months.".  I will send message to Dr. Frances Furbish for her to review while Dr. Anne Hahn is out of the office.

## 2020-02-20 MED ORDER — GABAPENTIN 100 MG PO CAPS: 100.0000 mg | ORAL_CAPSULE | Freq: Three times a day (TID) | ORAL | 1 refills | Status: DC

## 2020-02-20 NOTE — Addendum Note (Signed)
Addended by: York Spaniel on: 02/20/2020 08:26 AM   Modules accepted: Orders

## 2020-02-20 NOTE — Telephone Encounter (Signed)
I called the patient.  I left a message.  The patient is having ongoing headaches, sedimentation rate was 7.  If he is amenable to getting MRI of the brain, I will get this set up now.  I will go ahead and send in a prescription for gabapentin, he will call for any dose adjustments.

## 2020-02-25 ENCOUNTER — Telehealth: Payer: Self-pay | Admitting: Neurology

## 2020-02-25 ENCOUNTER — Other Ambulatory Visit: Payer: Self-pay | Admitting: Neurology

## 2020-02-25 DIAGNOSIS — G4489 Other headache syndrome: Secondary | ICD-10-CM

## 2020-02-25 MED ORDER — CARBAMAZEPINE ER 100 MG PO CP12: 100.0000 mg | ORAL_CAPSULE | Freq: Two times a day (BID) | ORAL | 1 refills | Status: DC

## 2020-02-25 NOTE — Telephone Encounter (Signed)
Is 90 day appropriate at this time?

## 2020-02-25 NOTE — Telephone Encounter (Signed)
[employer] medicare/medicaid order sent to [facility]. No auth they will reach out to the patient to schedule.  °

## 2020-02-25 NOTE — Telephone Encounter (Signed)
Pt has called to report that he is unable to think, focus or remember when on gabapentin (NEURONTIN) 100 MG capsule.  Pt states when he stops the gabapentin (NEURONTIN) 100 MG capsule the tremors and other symptoms stop, please call.  Pt asked that Dr Anne Hahn also be informed that he would like to be scheduled for the MRI.

## 2020-02-25 NOTE — Addendum Note (Signed)
Addended by: York Spaniel on: 02/25/2020 01:47 PM   Modules accepted: Orders

## 2020-02-25 NOTE — Telephone Encounter (Signed)
I called the patient.  He could not tolerate the gabapentin as it may be akathisia worse.  He will stop the gabapentin, I will start low-dose carbamazepine.  The patient will be set up for MRI of the brain.  He is still having the occipital pain and pain behind the eye.

## 2020-03-03 ENCOUNTER — Other Ambulatory Visit: Payer: Self-pay | Admitting: Neurology

## 2020-03-03 DIAGNOSIS — Z77018 Contact with and (suspected) exposure to other hazardous metals: Secondary | ICD-10-CM

## 2020-03-10 ENCOUNTER — Other Ambulatory Visit: Payer: Self-pay | Admitting: Neurology

## 2020-03-10 MED ORDER — CARBAMAZEPINE ER 100 MG PO CP12
100.0000 mg | ORAL_CAPSULE | Freq: Three times a day (TID) | ORAL | 2 refills | Status: DC
Start: 2020-03-10 — End: 2020-03-24

## 2020-03-19 ENCOUNTER — Other Ambulatory Visit: Payer: Self-pay | Admitting: Neurology

## 2020-03-22 ENCOUNTER — Other Ambulatory Visit: Payer: Medicaid Other

## 2020-03-22 ENCOUNTER — Ambulatory Visit
Admission: RE | Admit: 2020-03-22 | Discharge: 2020-03-22 | Disposition: A | Discharge status: Home / Self Care | Payer: Medicare Other | Source: Ambulatory Visit | Attending: Neurology | Admitting: Neurology

## 2020-03-22 DIAGNOSIS — G4489 Other headache syndrome: Secondary | ICD-10-CM

## 2020-03-24 ENCOUNTER — Encounter: Payer: Self-pay | Admitting: Neurology

## 2020-03-24 ENCOUNTER — Telehealth: Payer: Self-pay | Admitting: Neurology

## 2020-03-24 MED ORDER — CARBAMAZEPINE ER 200 MG PO CP12: 200.0000 mg | ORAL_CAPSULE | Freq: Two times a day (BID) | ORAL | 3 refills | Status: DC

## 2020-03-24 NOTE — Telephone Encounter (Signed)
I called the patient.  The MRI shows minimal nonspecific white matter changes.  The patient still having neuralgia type symptoms, the carbamazepine is helpful but wears off before the next dose.  We will increase the medication to 200 mg twice daily.   MRI brain 03/23/20:  IMPRESSION:   This MRI of the brain without contrast shows the following:  1.   Scattered T2/FLAIR hyperintense foci in the subcortical and deep white matter of the hemispheres.  This is a nonspecific finding but most likely represents mild chronic microvascular ischemic change, typical for age.  None of the foci appear to be acute. 2.   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   There were no acute findings.

## 2020-03-31 ENCOUNTER — Telehealth: Payer: Self-pay | Admitting: Emergency Medicine

## 2020-03-31 NOTE — Telephone Encounter (Signed)
PA started for patient on CMM, Key [id number].  Awaiting determination from [facility].

## 2020-04-03 NOTE — Telephone Encounter (Signed)
Notified by [facility] that equetro PA has been denied.  Request Reference Number: [id number]. EQUETRO CAP 200MG  is denied for not meeting the prior authorization requirement(s).  Sent patient mychart message to notify of denial and provide discount card information.

## 2020-05-13 ENCOUNTER — Other Ambulatory Visit: Payer: Self-pay | Admitting: Neurology

## 2020-05-26 MED ORDER — CARBAMAZEPINE ER 200 MG PO CP12
400.0000 mg | ORAL_CAPSULE | Freq: Two times a day (BID) | ORAL | 0 refills | Status: DC
Start: 2020-05-26 — End: 2020-06-16

## 2020-05-26 MED ORDER — CARBAMAZEPINE ER 200 MG PO CP12
200.0000 mg | ORAL_CAPSULE | Freq: Two times a day (BID) | ORAL | 3 refills | Status: DC
Start: 2020-05-26 — End: 2020-05-26

## 2020-05-26 NOTE — Telephone Encounter (Signed)
I found this message in the WID in box this morning. Request for Carbamazepine 200 mg from Friday, requested for use 2 capsules bid, which is a double dose to what Dr Pattricia Boss had prescribed. I will send a 30 day supply but need Dr. Anne Hahn to change the script in order for patient to get refills.  Again , this would be 400 mg bid po.

## 2020-06-16 ENCOUNTER — Other Ambulatory Visit: Payer: Self-pay | Admitting: Neurology

## 2020-06-16 MED ORDER — CARBAMAZEPINE ER 200 MG PO CP12
200.0000 mg | ORAL_CAPSULE | Freq: Three times a day (TID) | ORAL | 1 refills | Status: DC
Start: 2020-06-16 — End: 2020-06-18

## 2020-06-18 ENCOUNTER — Other Ambulatory Visit: Payer: Self-pay | Admitting: Neurology

## 2020-06-18 MED ORDER — CARBAMAZEPINE 200 MG PO TABS: 200.0000 mg | ORAL_TABLET | Freq: Three times a day (TID) | ORAL | 1 refills | Status: DC

## 2020-06-18 NOTE — Telephone Encounter (Signed)
Patient stated that he needs a generic carbamazepine prescription called in.  He cannot afford the Equetro brand name.

## 2020-07-28 DIAGNOSIS — M1811 Unilateral primary osteoarthritis of first carpometacarpal joint, right hand: Secondary | ICD-10-CM | POA: Diagnosis not present

## 2020-08-04 DIAGNOSIS — M1811 Unilateral primary osteoarthritis of first carpometacarpal joint, right hand: Secondary | ICD-10-CM | POA: Diagnosis not present

## 2020-08-06 DIAGNOSIS — M1811 Unilateral primary osteoarthritis of first carpometacarpal joint, right hand: Secondary | ICD-10-CM | POA: Diagnosis not present

## 2020-08-06 DIAGNOSIS — S63591A Other specified sprain of right wrist, initial encounter: Secondary | ICD-10-CM | POA: Diagnosis not present

## 2020-08-07 DIAGNOSIS — M1811 Unilateral primary osteoarthritis of first carpometacarpal joint, right hand: Secondary | ICD-10-CM | POA: Diagnosis not present

## 2020-08-25 ENCOUNTER — Other Ambulatory Visit: Payer: Self-pay | Admitting: Emergency Medicine

## 2020-08-25 MED ORDER — CLONAZEPAM 0.5 MG PO TABS
0.5000 mg | ORAL_TABLET | Freq: Three times a day (TID) | ORAL | 1 refills | Status: DC
Start: 2020-08-25 — End: 2021-03-02

## 2020-08-27 ENCOUNTER — Ambulatory Visit (INDEPENDENT_AMBULATORY_CARE_PROVIDER_SITE_OTHER): Payer: Medicare Other | Admitting: Neurology

## 2020-08-27 ENCOUNTER — Encounter: Payer: Self-pay | Admitting: Neurology

## 2020-08-27 ENCOUNTER — Other Ambulatory Visit: Payer: Self-pay

## 2020-08-27 VITALS — BP 149/93 | HR 69 | Ht 72.0 in | Wt 354.2 lb

## 2020-08-27 DIAGNOSIS — G2401 Drug induced subacute dyskinesia: Secondary | ICD-10-CM | POA: Diagnosis not present

## 2020-08-27 DIAGNOSIS — G2571 Drug induced akathisia: Secondary | ICD-10-CM

## 2020-08-27 MED ORDER — VALBENAZINE TOSYLATE 40 MG PO CAPS: 40.0000 mg | ORAL_CAPSULE | Freq: Every day | ORAL | 3 refills | Status: DC

## 2020-08-27 NOTE — Progress Notes (Signed)
Reason for visit: Tardive movement disorder  Jared Carey is an 65 y.o. male  History of present illness:  Jared. Carey is a 65 year old right-handed white male with a history of prior use of antipsychotics, he has had issues with restless movements of the body and arms and legs.  The patient has responded somewhat to clonazepam but this has been not fully effective and inconsistent in its response.  He uses a cane for ambulation, may fall on occasion.  He is not sleeping well as he will wake up with the involuntary movements and cannot get back to sleep.  He has to nap during the day.  He does snore at night and claims that he has not been evaluated for sleep apnea previously.  He still has sharp pains in the back of the head but mainly is bothered by pains that project into the retro-orbital regions that are brief lasting 5 to 10 seconds.  He is on carbamazepine for this.  Past Medical History:  Diagnosis Date  . ADD (attention deficit disorder)   . Akathisia 01/18/2019  . Allergy   . Anemia   . Anxiety   . Barrett esophagus   . Chest pain 08/07/2016  . Colon polyps   . Depression   . Diverticulosis   . Dysphagia   . Edema   . GERD (gastroesophageal reflux disease)   . Hypertension   . Incontinence    urge  . Major depressive disorder   . Obesity   . Obesity   . Osteoarthritis of thumbs, bilateral   . Overactive bladder   . Renal infarction (HCC)   . Tardive akathisia   . Tardive dyskinesia   . Thoracic stomach hernia   . Thyroid disease   . Thyroid nodule    benign  . Venous insufficiency     Past Surgical History:  Procedure Laterality Date  . BIOPSY THYROID  2018   benign  . COLONOSCOPY  09/2017   [facility]  . ESOPHAGOGASTRODUODENOSCOPY  09/2017   [facility]  . INGUINAL HERNIA REPAIR Right 1982  . LAPAROSCOPIC CHOLECYSTECTOMY  2002  . LUMBAR DISC SURGERY  1999   L5-S1 fusion  . NERVE AND TENDON REPAIR Left 1998   left elbow  . TONSILLECTOMY  1960    Family History   Problem Relation Age of Onset  . Pneumonia Mother   . Mitral valve prolapse Mother   . Parkinsonism Mother   . Diabetes Mother   . Depression Mother   . Hyperthyroidism Mother   . Heart attack Father   . Bladder Cancer Father   . Hyperthyroidism Father   . Heart disease Father   . Breast cancer Sister   . Colon cancer Brother     Social history:  reports that he quit smoking about 45 years ago. His smoking use included cigarettes. He quit after 7.00 years of use. He quit smokeless tobacco use about 45 years ago.  His smokeless tobacco use included chew. He reports previous alcohol use. He reports previous drug use. Drug: Marijuana.    Allergies  Allergen Reactions  . Aripiprazole Other (See Comments)  . Flexeril [Cyclobenzaprine]   . Gabapentin     Increased acathisia  . Benadryl [Diphenhydramine Hcl (Sleep)]   . Omeprazole Diarrhea  . Robaxin [Methocarbamol] Rash  . Simvastatin     Medications:  Prior to Admission medications   Medication Sig Start Date End Date Taking? Authorizing Provider  albuterol (PROVENTIL HFA;VENTOLIN HFA) 108 (90 BASE)  MCG/ACT inhaler Inhale 2 puffs into the lungs every 6 (six) hours as needed for wheezing or shortness of breath.   Yes [provider]  amLODipine (NORVASC) 5 MG tablet Take 5 mg by mouth daily.   Yes [provider]  aspirin 325 MG tablet Take 325 mg by mouth daily.    Yes [provider]  carbamazepine (TEGRETOL) 200 MG tablet Take 1 tablet (200 mg total) by mouth 3 (three) times daily. 06/18/20  Yes York Spaniel, MD  clonazePAM (KLONOPIN) 0.5 MG tablet Take 1 tablet (0.5 mg total) by mouth in the morning, at noon, and at bedtime. 08/25/20  Yes York Spaniel, MD  Coenzyme Q10 (CO Q 10) 100 MG CAPS Take 100 mg by mouth daily.   Yes [provider]  fluticasone (FLONASE) 50 MCG/ACT nasal spray Place 1 spray into both nostrils daily.   Yes [provider]  ibuprofen (ADVIL) 200 MG  tablet Take 400 mg by mouth. Every 5 hours.   Yes [provider]  LIDOCAINE EX Apply topically. 4% ointment.1 application topically 3 times daily as needed.   Yes [provider]  loratadine (CLARITIN) 10 MG tablet Take 10 mg by mouth daily as needed.   Yes [provider]  Multiple Vitamin (MULTIVITAMIN) capsule Take 2 capsules by mouth daily.   Yes [provider]  oxybutynin (DITROPAN-XL) 10 MG 24 hr tablet Take 20 mg by mouth 2 (two) times daily.   Yes [provider]  Probiotic Product (PROBIOTIC PO) Take by mouth.   Yes [provider]  propranolol (INDERAL) 40 MG tablet TAKE 1 TABLET(40 MG) BY MOUTH TWICE DAILY 09/18/19  Yes York Spaniel, MD  traZODone (DESYREL) 100 MG tablet Take 100 mg by mouth at bedtime.   Yes [provider]  VALERIAN ROOT PO Take by mouth.   Yes [provider]  venlafaxine XR (EFFEXOR-XR) 75 MG 24 hr capsule Take 225 mg by mouth daily with breakfast.   Yes [provider]  predniSONE (DELTASONE) 5 MG tablet Begin taking 6 tablets daily, taper by one tablet daily until off the medication. 02/13/20   York Spaniel, MD    ROS:  Out of a complete 14 system review of symptoms, the patient complains only of the following symptoms, and all other reviewed systems are negative.  Head pains Abnormal movements Walking difficulty  Blood pressure (!) 149/93, pulse 69, height 6' (1.829 m), weight (!) 354 lb 3.2 oz (160.7 kg).  Physical Exam  General: The patient is alert and cooperative at the time of the examination.  The patient is markedly obese.  Skin: No significant peripheral edema is noted.   Neurologic Exam  Mental status: The patient is alert and oriented x 3 at the time of the examination. The patient has apparent normal recent and remote memory, with an apparently normal attention span and concentration ability.   Cranial nerves: Facial symmetry is present. Speech is  normal, no aphasia or dysarthria is noted. Extraocular movements are full. Visual fields are full.  Motor: The patient has good strength in all 4 extremities.  Sensory examination: Soft touch sensation is symmetric on the face, arms, and legs, with exception of some decreased soft touch sensation on the right forehead as compared to the left.  Coordination: The patient has good finger-nose-finger and heel-to-shin bilaterally.  The patient does have some rocking movements with the body, some fidgety movements with the hands and feet.  Gait and  station: The patient has a slightly wide-based gait, he can walk independently.  He usually uses a cane for ambulation.  Tandem gait was not attempted.  Reflexes: Deep tendon reflexes are symmetric.   MRI brain 03/23/20:  IMPRESSION: This MRI of the brain without contrast shows the following:  1. Scattered T2/FLAIR hyperintense foci in the subcortical and deep white matter of the hemispheres. This is a nonspecific finding but most likely represents mild chronic microvascular ischemic change, typical for age. None of the foci appear to be acute. 2. 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 There were no acute findings.  * MRI scan images were reviewed online. I agree with the written report.    Assessment/Plan:  1.  Tardive movement disorder  2.  Headache, neuralgia pain  The patient will continue carbamazepine for now, we will check blood work on the next visit.  We will try to get the patient on Ingrezza taking 40 mg daily.  He will continue the clonazepam for now.  He will follow up here in 6 months.  Marlan Palau MD 08/27/2020 10:21 AM  [facility] [address] [place], [place] [address]  Phone [phone] Fax [phone]

## 2020-08-27 NOTE — Patient Instructions (Signed)
We will try to get Ingrezza for the tardive movements.

## 2020-10-30 DIAGNOSIS — H04123 Dry eye syndrome of bilateral lacrimal glands: Secondary | ICD-10-CM | POA: Diagnosis not present

## 2020-10-30 DIAGNOSIS — H353131 Nonexudative age-related macular degeneration, bilateral, early dry stage: Secondary | ICD-10-CM | POA: Diagnosis not present

## 2020-10-30 DIAGNOSIS — H2513 Age-related nuclear cataract, bilateral: Secondary | ICD-10-CM | POA: Diagnosis not present

## 2020-12-13 ENCOUNTER — Other Ambulatory Visit: Payer: Self-pay | Admitting: Neurology

## 2020-12-18 DIAGNOSIS — M542 Cervicalgia: Secondary | ICD-10-CM

## 2020-12-18 DIAGNOSIS — M25519 Pain in unspecified shoulder: Secondary | ICD-10-CM

## 2020-12-23 NOTE — Telephone Encounter (Signed)
Faxed referral to the requested location. [facility] [address] [place], [place] [address]. Phone: [phone]. Fax: [phone].

## 2020-12-31 DIAGNOSIS — G4486 Cervicogenic headache: Secondary | ICD-10-CM | POA: Diagnosis not present

## 2020-12-31 DIAGNOSIS — M62838 Other muscle spasm: Secondary | ICD-10-CM | POA: Diagnosis not present

## 2020-12-31 DIAGNOSIS — M542 Cervicalgia: Secondary | ICD-10-CM | POA: Diagnosis not present

## 2021-01-05 DIAGNOSIS — G4486 Cervicogenic headache: Secondary | ICD-10-CM | POA: Diagnosis not present

## 2021-01-05 DIAGNOSIS — M542 Cervicalgia: Secondary | ICD-10-CM | POA: Diagnosis not present

## 2021-01-05 DIAGNOSIS — M62838 Other muscle spasm: Secondary | ICD-10-CM | POA: Diagnosis not present

## 2021-01-13 NOTE — Telephone Encounter (Signed)
Form received, signed and faxed back to [phone]

## 2021-01-28 DIAGNOSIS — M62838 Other muscle spasm: Secondary | ICD-10-CM | POA: Diagnosis not present

## 2021-01-28 DIAGNOSIS — G4486 Cervicogenic headache: Secondary | ICD-10-CM | POA: Diagnosis not present

## 2021-01-28 DIAGNOSIS — M542 Cervicalgia: Secondary | ICD-10-CM | POA: Diagnosis not present

## 2021-02-04 DIAGNOSIS — G4486 Cervicogenic headache: Secondary | ICD-10-CM | POA: Diagnosis not present

## 2021-02-04 DIAGNOSIS — M62838 Other muscle spasm: Secondary | ICD-10-CM | POA: Diagnosis not present

## 2021-02-04 DIAGNOSIS — M542 Cervicalgia: Secondary | ICD-10-CM | POA: Diagnosis not present

## 2021-02-10 DIAGNOSIS — M542 Cervicalgia: Secondary | ICD-10-CM | POA: Diagnosis not present

## 2021-02-10 DIAGNOSIS — M62838 Other muscle spasm: Secondary | ICD-10-CM | POA: Diagnosis not present

## 2021-02-10 DIAGNOSIS — G4486 Cervicogenic headache: Secondary | ICD-10-CM | POA: Diagnosis not present

## 2021-02-12 DIAGNOSIS — M62838 Other muscle spasm: Secondary | ICD-10-CM | POA: Diagnosis not present

## 2021-02-12 DIAGNOSIS — G4486 Cervicogenic headache: Secondary | ICD-10-CM | POA: Diagnosis not present

## 2021-02-12 DIAGNOSIS — M542 Cervicalgia: Secondary | ICD-10-CM | POA: Diagnosis not present

## 2021-02-17 DIAGNOSIS — M62838 Other muscle spasm: Secondary | ICD-10-CM | POA: Diagnosis not present

## 2021-02-17 DIAGNOSIS — G4486 Cervicogenic headache: Secondary | ICD-10-CM | POA: Diagnosis not present

## 2021-02-17 DIAGNOSIS — M542 Cervicalgia: Secondary | ICD-10-CM | POA: Diagnosis not present

## 2021-02-24 DIAGNOSIS — M542 Cervicalgia: Secondary | ICD-10-CM | POA: Diagnosis not present

## 2021-02-24 DIAGNOSIS — M62838 Other muscle spasm: Secondary | ICD-10-CM | POA: Diagnosis not present

## 2021-02-24 DIAGNOSIS — G4486 Cervicogenic headache: Secondary | ICD-10-CM | POA: Diagnosis not present

## 2021-02-27 ENCOUNTER — Telehealth: Payer: Self-pay | Admitting: Neurology

## 2021-02-27 NOTE — Telephone Encounter (Signed)
Pt called requesting refill for clonazePAM (KLONOPIN) 0.5 MG tablet. Pharmacy [facility] [phone].

## 2021-03-02 MED ORDER — CLONAZEPAM 0.5 MG PO TABS: 0.5000 mg | ORAL_TABLET | Freq: Three times a day (TID) | ORAL | 1 refills | Status: DC

## 2021-03-02 NOTE — Addendum Note (Signed)
Addended by: York Spaniel on: 03/02/2021 08:29 AM   Modules accepted: Orders

## 2021-03-02 NOTE — Telephone Encounter (Signed)
A prescription for clonazepam was sent in. 

## 2021-03-03 DIAGNOSIS — M62838 Other muscle spasm: Secondary | ICD-10-CM | POA: Diagnosis not present

## 2021-03-03 DIAGNOSIS — M542 Cervicalgia: Secondary | ICD-10-CM | POA: Diagnosis not present

## 2021-03-03 DIAGNOSIS — G4486 Cervicogenic headache: Secondary | ICD-10-CM | POA: Diagnosis not present

## 2021-03-09 ENCOUNTER — Telehealth: Payer: Self-pay | Admitting: Neurology

## 2021-03-09 ENCOUNTER — Encounter: Payer: Self-pay | Admitting: Neurology

## 2021-03-09 ENCOUNTER — Ambulatory Visit (INDEPENDENT_AMBULATORY_CARE_PROVIDER_SITE_OTHER): Payer: Medicare Other | Admitting: Neurology

## 2021-03-09 VITALS — BP 126/82 | HR 78 | Ht 73.0 in | Wt 352.0 lb

## 2021-03-09 DIAGNOSIS — M5432 Sciatica, left side: Secondary | ICD-10-CM

## 2021-03-09 DIAGNOSIS — R269 Unspecified abnormalities of gait and mobility: Secondary | ICD-10-CM | POA: Diagnosis not present

## 2021-03-09 DIAGNOSIS — G2571 Drug induced akathisia: Secondary | ICD-10-CM | POA: Diagnosis not present

## 2021-03-09 DIAGNOSIS — M5481 Occipital neuralgia: Secondary | ICD-10-CM

## 2021-03-09 DIAGNOSIS — M5431 Sciatica, right side: Secondary | ICD-10-CM | POA: Diagnosis not present

## 2021-03-09 HISTORY — DX: Unspecified abnormalities of gait and mobility: R26.9

## 2021-03-09 HISTORY — DX: Occipital neuralgia: M54.81

## 2021-03-09 MED ORDER — ALPRAZOLAM 0.5 MG PO TABS: ORAL_TABLET | ORAL | 0 refills | Status: DC

## 2021-03-09 NOTE — Telephone Encounter (Signed)
[employer] medicare/medicaid order sent to [facility], [place] they will reach out to the patient to schedule.  ?

## 2021-03-09 NOTE — Progress Notes (Signed)
Reason for visit: Acathisia, gait disorder, occipital neuralgia  Jared Carey is an 65 y.o. male  History of present illness:  Jared Carey is a 65 year old right-handed white male with history of morbid obesity and an underlying gait disturbance.  The patient uses a walker for ambulation.  He is followed through the [facility] hospital, he has had blood work done within the last month through the [place].  He has undergone some physical therapy which has not helped his balance but has helped his occipital neuralgia.  The patient has shooting pains that may be on either side of the head.  The carbamazepine helps but it tends to wear off after an hour or 2.  The patient has fallen on occasion, usually when he is not using the walker.  His last fall was about 3 weeks ago, he did not sustain any injury.  He does report some difficulty controlling the bladder that has been in a chronic issue for him.  He has low back pain with sciatica pain down both legs down the calf areas that is present with weightbearing and with walking.  He is limited in terms of how far he can walk, he can walk about 1/8 of a mile.  He has not had any recent evaluation of his neck or low back.  Past Medical History:  Diagnosis Date   ADD (attention deficit disorder)    Akathisia 01/18/2019   Allergy    Anemia    Anxiety    Barrett esophagus    Chest pain 08/07/2016   Colon polyps    Depression    Diverticulosis    Dysphagia    Edema    GERD (gastroesophageal reflux disease)    Hypertension    Incontinence    urge   Major depressive disorder    Obesity    Obesity    Osteoarthritis of thumbs, bilateral    Overactive bladder    Renal infarction (HCC)    Tardive akathisia    Tardive dyskinesia    Thoracic stomach hernia    Thyroid disease    Thyroid nodule    benign   Venous insufficiency     Past Surgical History:  Procedure Laterality Date   BIOPSY THYROID  2018   benign   COLONOSCOPY  09/2017   [facility]    ESOPHAGOGASTRODUODENOSCOPY  09/2017   [facility]   INGUINAL HERNIA REPAIR Right 1982   LAPAROSCOPIC CHOLECYSTECTOMY  2002   LUMBAR DISC SURGERY  1999   L5-S1 fusion   NERVE AND TENDON REPAIR Left 1998   left elbow   TONSILLECTOMY  1960    Family History  Problem Relation Age of Onset   Pneumonia Mother    Mitral valve prolapse Mother    Parkinsonism Mother    Diabetes Mother    Depression Mother    Hyperthyroidism Mother    Heart attack Father    Bladder Cancer Father    Hyperthyroidism Father    Heart disease Father    Breast cancer Sister    Colon cancer Brother     Social history:  reports that he quit smoking about 45 years ago. His smoking use included cigarettes. He quit smokeless tobacco use about 45 years ago.  His smokeless tobacco use included chew. He reports that he does not currently use alcohol. He reports that he does not currently use drugs after having used the following drugs: Marijuana.    Allergies  Allergen Reactions   Aripiprazole Other (See  Comments)   Flexeril [Cyclobenzaprine]    Gabapentin     Increased acathisia   Benadryl [Diphenhydramine Hcl (Sleep)]    Omeprazole Diarrhea   Robaxin [Methocarbamol] Rash   Simvastatin     Medications:  Prior to Admission medications   Medication Sig Start Date End Date Taking? Authorizing Provider  albuterol (PROVENTIL HFA;VENTOLIN HFA) 108 (90 BASE) MCG/ACT inhaler Inhale 2 puffs into the lungs every 6 (six) hours as needed for wheezing or shortness of breath.   Yes [provider]  amLODipine (NORVASC) 5 MG tablet Take 5 mg by mouth daily.   Yes [provider]  aspirin 325 MG tablet Take 325 mg by mouth daily.    Yes [provider]  carbamazepine (TEGRETOL) 200 MG tablet TAKE 1 TABLET BY MOUTH 3 TIMES DAILY. 12/16/20  Yes York Spaniel, MD  clonazePAM (KLONOPIN) 0.5 MG tablet Take 1 tablet (0.5 mg total) by mouth in the morning, at noon, and at bedtime. 03/02/21  Yes York Spaniel, MD  Coenzyme Q10 (CO Q 10) 100 MG CAPS Take 100 mg by mouth daily.   Yes [provider]  fluticasone (FLONASE) 50 MCG/ACT nasal spray Place 1 spray into both nostrils daily.   Yes [provider]  ibuprofen (ADVIL) 200 MG tablet Take 400 mg by mouth. Every 5 hours.   Yes [provider]  LIDOCAINE EX Apply topically. 4% ointment.1 application topically 3 times daily as needed.   Yes [provider]  loratadine (CLARITIN) 10 MG tablet Take 10 mg by mouth daily as needed.   Yes [provider]  Multiple Vitamin (MULTIVITAMIN) capsule Take 2 capsules by mouth daily.   Yes [provider]  oxybutynin (DITROPAN-XL) 10 MG 24 hr tablet Take 20 mg by mouth 2 (two) times daily.   Yes [provider]  Probiotic Product (PROBIOTIC PO) Take by mouth.   Yes [provider]  propranolol (INDERAL) 40 MG tablet TAKE 1 TABLET(40 MG) BY MOUTH TWICE DAILY 09/18/19  Yes York Spaniel, MD  traZODone (DESYREL) 100 MG tablet Take 100 mg by mouth at bedtime.   Yes [provider]  VALERIAN ROOT PO Take by mouth.   Yes [provider]  venlafaxine XR (EFFEXOR-XR) 75 MG 24 hr capsule Take 225 mg by mouth daily with breakfast.   Yes [provider]    ROS:  Out of a complete 14 system review of symptoms, the patient complains only of the following symptoms, and all other reviewed systems are negative.  Walking difficulty Sciatica Occipital neuralgia Bladder control problems  Blood pressure 126/82, pulse 78, height 6\' 1"  (1.854 m), weight (!) 352 lb (159.7 kg), SpO2 96 %.  Physical Exam  General: The patient is alert and cooperative at the time of the examination.  The patient is markedly obese.  Skin: No significant peripheral edema is noted.   Neurologic Exam  Mental status: The patient is alert and oriented x 3 at the time of the examination. The patient has apparent normal recent and remote  memory, with an apparently normal attention span and concentration ability.   Cranial nerves: Facial symmetry is present. Speech is normal, no aphasia or dysarthria is noted. Extraocular movements are full. Visual fields are full.  Motor: The patient has good strength in all 4 extremities.  Sensory examination: Soft touch sensation is symmetric on the face, arms, and legs.  Coordination: The patient has good finger-nose-finger and heel-to-shin bilaterally.  Restless movements are  minimal.  Gait and station: The patient has a somewhat wide-based gait, he normally uses a walker.  Tandem gait was not attempted.  Romberg is unsteady with a tendency to fall backwards.  Reflexes: Deep tendon reflexes are symmetric.   Assessment/Plan:  1.  Acathisia  2.  Gait disturbance  3.  Occipital neuralgia, bilateral  4.  Low back pain, bilateral sciatica/pseudoclaudication  The patient has improved with his acathisia.  He will remain on clonazepam and propranolol.  The patient is having ongoing problems with gait instability, this is worsening over time.  He has not gotten benefit with physical therapy.  We will set him up for MRI of the cervical spine and lumbar spine.  The patient reports what sounds like pseudoclaudication with pain down the legs with walking that limits his ability to ambulate longer distances.  He will follow-up here in 6 months.  In the future, he can be followed through Dr. Marjory Lies.  Marlan Palau MD 03/09/2021 11:20 AM  [facility] [address] [place], [place] [address]  Phone [phone] Fax [phone]

## 2021-03-10 DIAGNOSIS — M542 Cervicalgia: Secondary | ICD-10-CM | POA: Diagnosis not present

## 2021-03-10 DIAGNOSIS — M62838 Other muscle spasm: Secondary | ICD-10-CM | POA: Diagnosis not present

## 2021-03-10 DIAGNOSIS — G4486 Cervicogenic headache: Secondary | ICD-10-CM | POA: Diagnosis not present

## 2021-03-17 DIAGNOSIS — M62838 Other muscle spasm: Secondary | ICD-10-CM | POA: Diagnosis not present

## 2021-03-17 DIAGNOSIS — M542 Cervicalgia: Secondary | ICD-10-CM | POA: Diagnosis not present

## 2021-03-17 DIAGNOSIS — G4486 Cervicogenic headache: Secondary | ICD-10-CM | POA: Diagnosis not present

## 2021-03-24 DIAGNOSIS — M542 Cervicalgia: Secondary | ICD-10-CM | POA: Diagnosis not present

## 2021-03-24 DIAGNOSIS — M62838 Other muscle spasm: Secondary | ICD-10-CM | POA: Diagnosis not present

## 2021-03-24 DIAGNOSIS — G4486 Cervicogenic headache: Secondary | ICD-10-CM | POA: Diagnosis not present

## 2021-03-31 DIAGNOSIS — M62838 Other muscle spasm: Secondary | ICD-10-CM | POA: Diagnosis not present

## 2021-03-31 DIAGNOSIS — G4486 Cervicogenic headache: Secondary | ICD-10-CM | POA: Diagnosis not present

## 2021-03-31 DIAGNOSIS — M542 Cervicalgia: Secondary | ICD-10-CM | POA: Diagnosis not present

## 2021-04-04 ENCOUNTER — Other Ambulatory Visit: Payer: Self-pay

## 2021-04-04 ENCOUNTER — Ambulatory Visit
Admission: RE | Admit: 2021-04-04 | Discharge: 2021-04-04 | Disposition: A | Discharge status: Home / Self Care | Payer: Medicare Other | Source: Ambulatory Visit | Attending: Neurology | Admitting: Neurology

## 2021-04-04 DIAGNOSIS — M5431 Sciatica, right side: Secondary | ICD-10-CM

## 2021-04-04 DIAGNOSIS — R269 Unspecified abnormalities of gait and mobility: Secondary | ICD-10-CM

## 2021-04-04 DIAGNOSIS — M5432 Sciatica, left side: Secondary | ICD-10-CM

## 2021-04-06 ENCOUNTER — Telehealth: Payer: Self-pay | Admitting: Neurology

## 2021-04-06 DIAGNOSIS — M48062 Spinal stenosis, lumbar region with neurogenic claudication: Secondary | ICD-10-CM

## 2021-04-06 NOTE — Telephone Encounter (Signed)
I called and talk with the patient.  Indicated that he had spinal stenosis at the L4-5 level in the back.  He claims that he has had 4 or 5 epidural steroid injections through the [facility] hospital, they will help the pain for about 5 or 6 weeks and then pain comes back.  His original back surgery was done through Dr. Jordan Likes, I will get him set up to see Dr. Jordan Likes again.

## 2021-04-06 NOTE — Telephone Encounter (Signed)
I called the patient again about the MRI results, left a message, I will call back later.

## 2021-04-06 NOTE — Telephone Encounter (Signed)
  I called the patient, left a message.  MRI of the lumbar spine does show spinal stenosis at the L4-5 level which may explain his pseudoclaudication symptoms with walking.  I am not sure that this would explain all of his balance issues.  No evidence of spinal cord compression, MRI of the brain did not show severe small vessel disease.  If the patient desires, we can do an epidural steroid injection, I will call back later to discuss this with him.   MRI lumbar 04/05/21:  IMPRESSION:    MRI lumbar spine (without) demonstrating: - L4-5: disc bulging and facet hypertrophy with moderate-severe spinal stenosis and moderate right and severe left foraminal stenosis. - L5-S1: posterior decompression with no spinal stenosis or foraminal narrowing. - Mild biforaminal stenosis from L1-2 to L3-4.    MRI cervical 04/05/21:   IMPRESSION:    MRI cervical spine (without) demonstrating: - At C4-5: disc bulging and facet hypertrophy with mild spinal stenosis and mild biforaminal stenosis. - At C5-6: disc bulging and facet hypertrophy with mild spinal stenosis and moderate biforaminal stenosis.

## 2021-04-07 ENCOUNTER — Telehealth: Payer: Self-pay | Admitting: Neurology

## 2021-04-07 DIAGNOSIS — G4486 Cervicogenic headache: Secondary | ICD-10-CM | POA: Diagnosis not present

## 2021-04-07 DIAGNOSIS — M542 Cervicalgia: Secondary | ICD-10-CM | POA: Diagnosis not present

## 2021-04-07 DIAGNOSIS — M62838 Other muscle spasm: Secondary | ICD-10-CM | POA: Diagnosis not present

## 2021-04-07 NOTE — Telephone Encounter (Signed)
Sent to [facility] ph # [phone]. 

## 2021-04-13 DIAGNOSIS — M62838 Other muscle spasm: Secondary | ICD-10-CM | POA: Diagnosis not present

## 2021-04-13 DIAGNOSIS — G4486 Cervicogenic headache: Secondary | ICD-10-CM | POA: Diagnosis not present

## 2021-04-13 DIAGNOSIS — M542 Cervicalgia: Secondary | ICD-10-CM | POA: Diagnosis not present

## 2021-04-14 ENCOUNTER — Telehealth: Payer: Self-pay | Admitting: *Deleted

## 2021-04-14 NOTE — Telephone Encounter (Signed)
Received PT orders from [facility]. Called [facility], spoke with Toni Amend and informed her Dr Anne Hahn has retired, cannot sign orders. She stated it is fine for the covering MD to sign orders. Placed on Dr Trevor Mace desk for signature; she is work in [place] this morning.

## 2021-04-14 NOTE — Telephone Encounter (Signed)
PT orders signed, faxed to [facility].

## 2021-04-28 DIAGNOSIS — G4486 Cervicogenic headache: Secondary | ICD-10-CM | POA: Diagnosis not present

## 2021-04-28 DIAGNOSIS — M62838 Other muscle spasm: Secondary | ICD-10-CM | POA: Diagnosis not present

## 2021-04-28 DIAGNOSIS — M542 Cervicalgia: Secondary | ICD-10-CM | POA: Diagnosis not present

## 2021-05-19 DIAGNOSIS — M62838 Other muscle spasm: Secondary | ICD-10-CM | POA: Diagnosis not present

## 2021-05-19 DIAGNOSIS — M542 Cervicalgia: Secondary | ICD-10-CM | POA: Diagnosis not present

## 2021-05-19 DIAGNOSIS — G4486 Cervicogenic headache: Secondary | ICD-10-CM | POA: Diagnosis not present

## 2021-06-03 DIAGNOSIS — H04123 Dry eye syndrome of bilateral lacrimal glands: Secondary | ICD-10-CM | POA: Diagnosis not present

## 2021-06-03 DIAGNOSIS — H353132 Nonexudative age-related macular degeneration, bilateral, intermediate dry stage: Secondary | ICD-10-CM | POA: Diagnosis not present

## 2021-06-03 DIAGNOSIS — H2513 Age-related nuclear cataract, bilateral: Secondary | ICD-10-CM | POA: Diagnosis not present

## 2021-06-10 DIAGNOSIS — M542 Cervicalgia: Secondary | ICD-10-CM | POA: Diagnosis not present

## 2021-06-10 DIAGNOSIS — M62838 Other muscle spasm: Secondary | ICD-10-CM | POA: Diagnosis not present

## 2021-06-10 DIAGNOSIS — G4486 Cervicogenic headache: Secondary | ICD-10-CM | POA: Diagnosis not present

## 2021-06-23 DIAGNOSIS — M62838 Other muscle spasm: Secondary | ICD-10-CM | POA: Diagnosis not present

## 2021-06-23 DIAGNOSIS — G4486 Cervicogenic headache: Secondary | ICD-10-CM | POA: Diagnosis not present

## 2021-06-23 DIAGNOSIS — M542 Cervicalgia: Secondary | ICD-10-CM | POA: Diagnosis not present

## 2021-07-01 DIAGNOSIS — M542 Cervicalgia: Secondary | ICD-10-CM | POA: Diagnosis not present

## 2021-07-01 DIAGNOSIS — G4486 Cervicogenic headache: Secondary | ICD-10-CM | POA: Diagnosis not present

## 2021-07-01 DIAGNOSIS — M62838 Other muscle spasm: Secondary | ICD-10-CM | POA: Diagnosis not present

## 2021-07-14 ENCOUNTER — Other Ambulatory Visit: Payer: Self-pay | Admitting: *Deleted

## 2021-07-14 MED ORDER — CARBAMAZEPINE 200 MG PO TABS: 200.0000 mg | ORAL_TABLET | Freq: Three times a day (TID) | ORAL | 0 refills | Status: DC

## 2021-08-07 DIAGNOSIS — M62838 Other muscle spasm: Secondary | ICD-10-CM | POA: Diagnosis not present

## 2021-08-07 DIAGNOSIS — M542 Cervicalgia: Secondary | ICD-10-CM | POA: Diagnosis not present

## 2021-08-07 DIAGNOSIS — G4486 Cervicogenic headache: Secondary | ICD-10-CM | POA: Diagnosis not present

## 2021-08-21 DIAGNOSIS — M542 Cervicalgia: Secondary | ICD-10-CM | POA: Diagnosis not present

## 2021-08-21 DIAGNOSIS — M62838 Other muscle spasm: Secondary | ICD-10-CM | POA: Diagnosis not present

## 2021-08-21 DIAGNOSIS — G4486 Cervicogenic headache: Secondary | ICD-10-CM | POA: Diagnosis not present

## 2021-08-26 DIAGNOSIS — M62838 Other muscle spasm: Secondary | ICD-10-CM | POA: Diagnosis not present

## 2021-08-26 DIAGNOSIS — G4486 Cervicogenic headache: Secondary | ICD-10-CM | POA: Diagnosis not present

## 2021-08-26 DIAGNOSIS — M542 Cervicalgia: Secondary | ICD-10-CM | POA: Diagnosis not present

## 2021-08-27 ENCOUNTER — Telehealth: Payer: Self-pay | Admitting: Diagnostic Neuroimaging

## 2021-08-27 NOTE — Telephone Encounter (Signed)
Pt called stating that he went to [place] to visit his son and in between traveling from there back to [place] somehow the bottle he had for his clonazePAM (KLONOPIN) 0.5 MG tablet was misplaced and the pt is wanting to know what is the process in being able to get the medication replaced. Please advise.  ?

## 2021-08-31 NOTE — Telephone Encounter (Signed)
At 2:39 this afternoon pt left vm reporting that he found his medication and is not in need of a refill or anything, he states everything is ok.  ?

## 2021-09-02 DIAGNOSIS — M62838 Other muscle spasm: Secondary | ICD-10-CM | POA: Diagnosis not present

## 2021-09-02 DIAGNOSIS — G4486 Cervicogenic headache: Secondary | ICD-10-CM | POA: Diagnosis not present

## 2021-09-02 DIAGNOSIS — M542 Cervicalgia: Secondary | ICD-10-CM | POA: Diagnosis not present

## 2021-09-08 ENCOUNTER — Ambulatory Visit: Payer: Medicare Other | Admitting: Diagnostic Neuroimaging

## 2021-09-08 ENCOUNTER — Ambulatory Visit: Payer: Medicare Other | Admitting: Neurology

## 2021-09-08 ENCOUNTER — Encounter: Payer: Self-pay | Admitting: Diagnostic Neuroimaging

## 2021-09-08 ENCOUNTER — Ambulatory Visit (INDEPENDENT_AMBULATORY_CARE_PROVIDER_SITE_OTHER): Payer: Medicare Other | Admitting: Diagnostic Neuroimaging

## 2021-09-08 VITALS — BP 122/73 | HR 71 | Ht 73.0 in | Wt 342.0 lb

## 2021-09-08 DIAGNOSIS — M5481 Occipital neuralgia: Secondary | ICD-10-CM

## 2021-09-08 DIAGNOSIS — G2571 Drug induced akathisia: Secondary | ICD-10-CM | POA: Diagnosis not present

## 2021-09-08 DIAGNOSIS — M48062 Spinal stenosis, lumbar region with neurogenic claudication: Secondary | ICD-10-CM | POA: Diagnosis not present

## 2021-09-08 MED ORDER — CARBAMAZEPINE 200 MG PO TABS: 200.0000 mg | ORAL_TABLET | Freq: Three times a day (TID) | ORAL | 4 refills | Status: AC

## 2021-09-08 MED ORDER — CLONAZEPAM 0.5 MG PO TABS: 0.5000 mg | ORAL_TABLET | Freq: Three times a day (TID) | ORAL | 4 refills | Status: DC

## 2021-09-08 NOTE — Progress Notes (Signed)
? ?[facility] ? ?PATIENT: Jared Carey ?DOB: [birth year] ? ?REFERRING CLINICIAN: Beacher May, MD ?HISTORY FROM: patient ?REASON FOR VISIT: follow up ? ? ?HISTORICAL ? ?CHIEF COMPLAINT:  ?Chief Complaint  ?Patient presents with  ? Akathisia  ?  Rm 6 New Pt, TOC Dr Jannifer Franklin  "legs are weak today, have fallen twice"   ? ? ?HISTORY OF PRESENT ILLNESS:  ? ?66 year old male here for evaluation lumbar spinal stenosis.  Patient was referred to neurosurgery but has not followed up..  Continues to have low back pain problems rating to the legs, has had several falls. ? ?Occipital neuralgia symptoms are stable.  He is taking carbamazepine. ? ?Akathisia related to prior Abilify use also stable on clonazepam. ? ?Most of his care is managed at the [place] in [place].  Patient lives in [place].  He sees PCP and psychiatry there. ? ? ?REVIEW OF SYSTEMS: Full 14 system review of systems performed and negative with exception of: as per HPI. ? ?ALLERGIES: ?Allergies  ?Allergen Reactions  ? Aripiprazole Other (See Comments)  ? Flexeril [Cyclobenzaprine]   ? Gabapentin   ?  Increased acathisia  ? Benadryl [Diphenhydramine Hcl (Sleep)]   ? Omeprazole Diarrhea  ? Robaxin [Methocarbamol] Rash  ? Simvastatin   ? ? ?HOME MEDICATIONS: ?Outpatient Medications Prior to Visit  ?Medication Sig Dispense Refill  ? albuterol (PROVENTIL HFA;VENTOLIN HFA) 108 (90 BASE) MCG/ACT inhaler Inhale 2 puffs into the lungs every 6 (six) hours as needed for wheezing or shortness of breath.    ? amLODipine (NORVASC) 5 MG tablet Take 5 mg by mouth daily.    ? aspirin 325 MG tablet Take 325 mg by mouth daily.     ? Coenzyme Q10 (CO Q 10) 100 MG CAPS Take 100 mg by mouth daily.    ? fluticasone (FLONASE) 50 MCG/ACT nasal spray Place 1 spray into both nostrils daily.    ? ibuprofen (ADVIL) 200 MG tablet Take 400 mg by mouth. Every 5 hours.    ? LIDOCAINE EX Apply topically. 4% ointment.1 application topically 3 times daily as needed.    ?  loratadine (CLARITIN) 10 MG tablet Take 10 mg by mouth daily as needed.    ? Multiple Vitamin (MULTIVITAMIN) capsule Take 2 capsules by mouth daily.    ? oxybutynin (DITROPAN-XL) 10 MG 24 hr tablet Take 20 mg by mouth 2 (two) times daily.    ? propranolol (INDERAL) 40 MG tablet TAKE 1 TABLET(40 MG) BY MOUTH TWICE DAILY 60 tablet 3  ? traZODone (DESYREL) 100 MG tablet Take 100 mg by mouth at bedtime.    ? venlafaxine XR (EFFEXOR-XR) 75 MG 24 hr capsule Take 225 mg by mouth daily with breakfast.    ? carbamazepine (TEGRETOL) 200 MG tablet Take 1 tablet (200 mg total) by mouth 3 (three) times daily. 270 tablet 0  ? clonazePAM (KLONOPIN) 0.5 MG tablet Take 1 tablet (0.5 mg total) by mouth in the morning, at noon, and at bedtime. 270 tablet 1  ? ALPRAZolam (XANAX) 0.5 MG tablet Take 2 tablets approximately 45 minutes prior to the MRI study, take a third tablet if needed. 3 tablet 0  ? Probiotic Product (PROBIOTIC PO) Take by mouth.    ? VALERIAN ROOT PO Take by mouth.    ? ?No facility-administered medications prior to visit.  ? ? ? ? ?PHYSICAL EXAM ? ?GENERAL EXAM/CONSTITUTIONAL: ?Vitals:  ?Vitals:  ? 09/08/21 1356  ?BP: 122/73  ?Pulse: 71  ?Weight: (!) 342 lb (155.1  kg)  ?Height: 6\' 1"  (1.854 m)  ? ?Body mass index is 45.12 kg/m?. ?Wt Readings from Last 3 Encounters:  ?09/08/21 (!) 342 lb (155.1 kg)  ?03/09/21 (!) 352 lb (159.7 kg)  ?08/27/20 (!) 354 lb 3.2 oz (160.7 kg)  ? ?Patient is in no distress; well developed, nourished and groomed; neck is supple ? ?CARDIOVASCULAR: ?Examination of carotid arteries is normal; no carotid bruits ?Regular rate and rhythm, no murmurs ?Examination of peripheral vascular system by observation and palpation is normal ? ?EYES: ?Ophthalmoscopic exam of optic discs and posterior segments is normal; no papilledema or hemorrhages ?No results found. ? ?MUSCULOSKELETAL: ?Gait, strength, tone, movements noted in Neurologic exam below ? ?NEUROLOGIC: ?MENTAL STATUS:  ?   ? View : No data to  display.  ?  ?  ?  ? ?awake, alert, oriented to person, place and time ?recent and remote memory intact ?normal attention and concentration ?language fluent, comprehension intact, naming intact ?fund of knowledge appropriate ? ?CRANIAL NERVE:  ?2nd - no papilledema on fundoscopic exam ?2nd, 3rd, 4th, 6th - pupils equal and reactive to light, visual fields full to confrontation, extraocular muscles intact, no nystagmus ?5th - facial sensation symmetric ?7th - facial strength symmetric ?8th - hearing intact ?9th - palate elevates symmetrically, uvula midline ?11th - shoulder shrug symmetric ?12th - tongue protrusion midline ? ?MOTOR:  ?normal bulk and tone, full strength in the BUE, BLE ? ?SENSORY:  ?normal and symmetric to light touch ? ?COORDINATION:  ?finger-nose-finger, fine finger movements normal ? ?REFLEXES:  ?deep tendon reflexes TRACE and symmetric ? ?GAIT/STATION:  ?narrow based gait; ROLLATOR WALKER ? ? ? ? ?DIAGNOSTIC DATA (LABS, IMAGING, TESTING) ?- I reviewed patient records, labs, notes, testing and imaging myself where available. ? ?Lab Results  ?Component Value Date  ? WBC 7.6 08/07/2016  ? HGB 14.7 08/07/2016  ? HCT 43.4 08/07/2016  ? MCV 86.0 08/07/2016  ? PLT 283 08/07/2016  ? ?   ?Component Value Date/Time  ? NA 136 08/07/2016 0724  ? K 4.0 08/07/2016 0724  ? CL 103 08/07/2016 0724  ? CO2 25 08/07/2016 0724  ? GLUCOSE 124 (H) 08/07/2016 0724  ? BUN 21 (H) 08/07/2016 0724  ? CREATININE 1.14 08/07/2016 0724  ? CALCIUM 8.0 (L) 08/07/2016 0724  ? PROT 7.5 08/06/2016 2042  ? ALBUMIN 3.9 08/06/2016 2042  ? AST 22 08/06/2016 2042  ? ALT 14 (L) 08/06/2016 2042  ? ALKPHOS 66 08/06/2016 2042  ? BILITOT 0.8 08/06/2016 2042  ? GFRNONAA >60 08/07/2016 0724  ? GFRAA >60 08/07/2016 0724  ? ?No results found for: CHOL, HDL, LDLCALC, LDLDIRECT, TRIG, CHOLHDL ?No results found for: HGBA1C ?No results found for: VITAMINB12 ?No results found for: TSH ? ? ?04/04/21 MRI cervical spine (without) demonstrating: ?- At  C4-5: disc bulging and facet hypertrophy with mild spinal stenosis and mild biforaminal stenosis. ?- At C5-6: disc bulging and facet hypertrophy with mild spinal stenosis and moderate biforaminal stenosis. ? ?04/04/21 MRI lumbar spine (without) demonstrating: ?- L4-5: disc bulging and facet hypertrophy with moderate-severe spinal stenosis and moderate right and severe left foraminal stenosis. ?- L5-S1: posterior decompression with no spinal stenosis or foraminal narrowing. ?- Mild biforaminal stenosis from L1-2 to L3-4.  ? ? ?ASSESSMENT AND PLAN ? ?66 y.o. year old male here with: ? ? ?Dx: ? ?1. Spinal stenosis of lumbar region with neurogenic claudication   ?2. Akathisia   ?3. Bilateral occipital neuralgia   ? ? ? ?PLAN: ? ?LUMBAR SPINAL STENOSIS ?-  follow up with neurosurgery (Dr. Annette Stable, per patient preference) ? 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 ?OCCIPITAL NEURALGIA ?- continue carbamazepine; consider pain mgmt referral (via neurosurgery) ? ?Meds ordered this encounter  ?Medications  ? carbamazepine (TEGRETOL) 200 MG tablet  ?  Sig: Take 1 tablet (200 mg total) by mouth 3 (three) times daily.  ?  Dispense:  270 tablet  ?  Refill:  4  ? clonazePAM (KLONOPIN) 0.5 MG tablet  ?  Sig: Take 1 tablet (0.5 mg total) by mouth in the morning, at noon, and at bedtime.  ?  Dispense:  270 tablet  ?  Refill:  4  ? ?Return in about 1 year (around 09/09/2022) for MyChart visit (15 min). Or as needed. ? ? ? ?Penni Bombard, MD [id number], [id number] PM ?Certified in Neurology, Neurophysiology and Neuroimaging ? ?[facility] ?[address], [address] ?[place], [place] [address] ?[phone] ? ?

## 2021-09-13 ENCOUNTER — Emergency Department
Admission: EM | Admit: 2021-09-13 | Discharge: 2021-09-14 | Disposition: A | Discharge status: Home / Self Care | Payer: Medicare Other | Attending: Emergency Medicine | Admitting: Emergency Medicine

## 2021-09-13 ENCOUNTER — Other Ambulatory Visit: Payer: Self-pay

## 2021-09-13 ENCOUNTER — Emergency Department: Payer: Medicare Other

## 2021-09-13 DIAGNOSIS — R42 Dizziness and giddiness: Secondary | ICD-10-CM | POA: Insufficient documentation

## 2021-09-13 DIAGNOSIS — R55 Syncope and collapse: Secondary | ICD-10-CM | POA: Diagnosis not present

## 2021-09-13 DIAGNOSIS — I10 Essential (primary) hypertension: Secondary | ICD-10-CM | POA: Diagnosis not present

## 2021-09-13 DIAGNOSIS — R0602 Shortness of breath: Secondary | ICD-10-CM | POA: Diagnosis not present

## 2021-09-13 DIAGNOSIS — Z20822 Contact with and (suspected) exposure to covid-19: Secondary | ICD-10-CM | POA: Diagnosis not present

## 2021-09-13 DIAGNOSIS — Z743 Need for continuous supervision: Secondary | ICD-10-CM | POA: Diagnosis not present

## 2021-09-13 DIAGNOSIS — R202 Paresthesia of skin: Secondary | ICD-10-CM | POA: Diagnosis not present

## 2021-09-13 DIAGNOSIS — R404 Transient alteration of awareness: Secondary | ICD-10-CM | POA: Diagnosis not present

## 2021-09-13 DIAGNOSIS — R110 Nausea: Secondary | ICD-10-CM | POA: Diagnosis not present

## 2021-09-13 DIAGNOSIS — R231 Pallor: Secondary | ICD-10-CM | POA: Diagnosis not present

## 2021-09-13 LAB — COMPREHENSIVE METABOLIC PANEL
ALT: 30 U/L (ref 0–44)
AST: 24 U/L (ref 15–41)
Albumin: 3.8 g/dL (ref 3.5–5.0)
Alkaline Phosphatase: 84 U/L (ref 38–126)
Anion gap: 5 (ref 5–15)
BUN: 18 mg/dL (ref 8–23)
CO2: 28 mmol/L (ref 22–32)
Calcium: 8.4 mg/dL — ABNORMAL LOW (ref 8.9–10.3)
Chloride: 102 mmol/L (ref 98–111)
Creatinine, Ser: 1.04 mg/dL (ref 0.61–1.24)
GFR, Estimated: >60 mL/min (ref >60)
Glucose, Bld: 121 mg/dL — ABNORMAL HIGH (ref 70–99)
Potassium: 3.9 mmol/L (ref 3.5–5.1)
Sodium: 135 mmol/L (ref 135–145)
Total Bilirubin: 0.6 mg/dL (ref 0.3–1.2)
Total Protein: 6.7 g/dL (ref 6.5–8.1)

## 2021-09-13 LAB — RESP PANEL BY RT-PCR (FLU A&B, COVID) ARPGX2
Influenza A by PCR: NEGATIVE
Influenza B by PCR: NEGATIVE
SARS Coronavirus 2 by RT PCR: NEGATIVE

## 2021-09-13 LAB — CBC
HCT: 41.5 % (ref 39.0–52.0)
Hemoglobin: 14.1 g/dL (ref 13.0–17.0)
MCH: 29.8 pg (ref 26.0–34.0)
MCHC: 34 g/dL (ref 30.0–36.0)
MCV: 87.7 fL (ref 80.0–100.0)
Platelets: 212 10*3/uL (ref 150–400)
RBC: 4.73 MIL/uL (ref 4.22–5.81)
RDW: 12 % (ref 11.5–15.5)
WBC: 9.2 10*3/uL (ref 4.0–10.5)
nRBC: 0 % (ref 0.0–0.2)

## 2021-09-13 LAB — TROPONIN I (HIGH SENSITIVITY): Troponin I (High Sensitivity): 3 ng/L (ref <18)

## 2021-09-13 MED ORDER — SODIUM CHLORIDE 0.9 % IV BOLUS
1000.0000 mL | Freq: Once | INTRAVENOUS | Status: AC
  Administered 2021-09-13: 1000 mL via INTRAVENOUS

## 2021-09-13 NOTE — ED Provider Notes (Signed)
? ?[facility] ?Provider Note ? ? ? Event Date/Time  ? First MD Initiated Contact with Patient 09/13/21 2208   ?  (approximate) ? ?History  ? ?Chief Complaint: Loss of Consciousness ? ?HPI ? ?Jared Carey is a 66 y.o. male with a past medical history of obesity, gastric reflux, hypertension, presents to the emergency department for dizziness and syncopal episode.  According to the patient this evening he was moving groceries into the house when he became very dizzy.  He sat down on a chair on his front porch and had a brief syncopal episode.  Patient denies any history of syncope previously.  Denies any chest pain at any point.  Does states some nausea earlier today and has been experiencing some intermittent diarrhea.  No fever cough or congestion.  Patient noted to be hypoxic 88% on room air upon arrival no baseline O2 requirement.  Currently satting in the mid 90s on 2 L. ? ?Physical Exam  ? ?Triage Vital Signs: ?ED Triage Vitals  ?Enc Vitals Group  ?   BP 09/13/21 2150 135/87  ?   Pulse Rate 09/13/21 2150 71  ?   Resp 09/13/21 2150 20  ?   Temp 09/13/21 2150 97.9 ?F (36.6 ?C)  ?   Temp Source 09/13/21 2150 Oral  ?   SpO2 09/13/21 2144 100 %  ?   Weight 09/13/21 2148 (!) 342 lb (155.1 kg)  ?   Height 09/13/21 2148 6\' 1"  (1.854 m)  ?   Head Circumference --   ?   Peak Flow --   ?   Pain Score 09/13/21 2148 0  ?   Pain Loc --   ?   Pain Edu? --   ?   Excl. in GC? --   ? ? ?Most recent vital signs: ?Vitals:  ? 09/13/21 2150 09/13/21 2156  ?BP: 135/87   ?Pulse: 71   ?Resp: 20   ?Temp: 97.9 ?F (36.6 ?C)   ?SpO2: (!) 88% 94%  ? ? ?General: Awake, no distress.  ?CV:  Good peripheral perfusion.  Regular rate and rhythm  ?Resp:  Normal effort.  Equal breath sounds bilaterally.  ?Abd:  No distention.  Soft, nontender.  No rebound or guarding.  Obese.  Longstanding umbilical hernia with slight tenderness which he states is chronic. ? ? ?ED Results / Procedures / Treatments  ? ?EKG ? ?EKG viewed and  interpreted by myself shows a normal sinus rhythm at 74 bpm with a narrow QRS, normal axis, normal intervals, no concerning ST changes.  Reassuring EKG. ? ?RADIOLOGY ? ?Chest x-ray viewed by myself shows minimal inhalation but no obvious abnormality seen on my evaluation. ?Radiology is read the chest x-ray is negative. ? ? ?MEDICATIONS ORDERED IN ED: ?Medications  ?sodium chloride 0.9 % bolus 1,000 mL (1,000 mLs Intravenous New Bag/Given 09/13/21 2248)  ? ? ? ?IMPRESSION / MDM / ASSESSMENT AND PLAN / ED COURSE  ?I reviewed the triage vital signs and the nursing notes. ? ?Patient presents emergency department after syncopal episode earlier tonight.  Patient states he was exerting himself bringing groceries in to the house from the car when he began feeling dizzy lightheaded sat down on a chair and had a brief syncopal episode.  Overall the patient appears well, no concerning findings on physical exam.  Patient was initially hypoxic 88% on room air.  We will obtain a chest x-ray and a COVID swab.  We will IV hydrate check labs including cardiac  enzymes.  EKG shows no concerning findings.  No headache weakness or slurred speech.  We will continue to closely monitor while awaiting results to help decide upon disposition.  Patient agreeable to plan. ? ?Patient's labs show negative COVID/flu.  Normal CBC with normal white blood cell count.  Negative troponin.  Normal chemistry.  Chest x-ray is normal.  EKG reassuring.  We will discontinue oxygen continue to closely monitor.  We will repeat a troponin.  Patient is feeling better with fluids.  Lungs the patient continues to feel better has a repeat troponin is negative anticipate patient could be discharged home.  Patient care signed out to oncoming provider. ? ?FINAL CLINICAL IMPRESSION(S) / ED DIAGNOSES  ? ?Syncope ? ? ?Note:  This document was prepared using Dragon voice recognition software and may include unintentional dictation errors. ?  Minna Antis,  MD ?09/13/21 2340 ? ?

## 2021-09-13 NOTE — ED Triage Notes (Addendum)
Pt arrives [facility] after a syncopal episode and brief loss of consciousness while sitting in a chair. Becomes dizzy when sunglasses are removed.  ? ?Describes a "weird sensation like his sugar is dropping and a tingling between shohulders to chest. Sx > 2 weeks.  ? ? ?4mg  zofran admin pta.  ?

## 2021-09-13 NOTE — ED Notes (Signed)
2L Char[place] applied.  

## 2021-09-14 LAB — URINALYSIS, COMPLETE (UACMP) WITH MICROSCOPIC
Bacteria, UA: NONE SEEN
Bilirubin Urine: NEGATIVE
Glucose, UA: NEGATIVE mg/dL
Hgb urine dipstick: NEGATIVE
Ketones, ur: NEGATIVE mg/dL
Leukocytes,Ua: NEGATIVE
Nitrite: NEGATIVE
Protein, ur: 30 mg/dL — AB
Specific Gravity, Urine: 1.01 (ref 1.005–1.030)
Squamous Epithelial / HPF: NONE SEEN (ref 0–5)
pH: 6 (ref 5.0–8.0)

## 2021-09-14 LAB — TROPONIN I (HIGH SENSITIVITY): Troponin I (High Sensitivity): 4 ng/L (ref <18)

## 2021-09-14 NOTE — ED Provider Notes (Signed)
----------------------------------------- ?  1:52 AM on 09/14/2021 ?----------------------------------------- ? ?I took over care on this patient from Dr. Kerman Passey.  Repeat troponin, chest x-ray, and other labs are within normal limits.  On reassessment the patient states he is feeling much better and would like to go home.  His vital signs are stable.  He is appropriate for discharge at this time.  I counseled him on the results of the work-up.  I gave the patient very thorough return precautions and he expressed understanding.  He agrees to follow-up with his PMD. ?  Arta Silence, MD ?09/14/21 0153 ? ?

## 2021-09-14 NOTE — ED Notes (Signed)
Pt discharge information reviewed. Pt understands need for follow up care and when to return if symptoms worsen. All questions answered. Pt is alert and oriented with even and regular respirations. Pt is brought out of department in wheelchair. °

## 2021-09-14 NOTE — Discharge Instructions (Signed)
At your ED visit today, you had a metabolic panel, CBC, troponins (cardiac enzymes), a urinalysis, EKG and chest x-ray, all of which were normal. ? ?Follow-up with your primary care doctor. ? ?Return to the ER for new, worsening, or recurrent dizziness or lightheadedness, feel like you are going to pass out or actually passing out, chest pain, difficulty breathing, or any other new or worsening symptoms that concern you. ?

## 2021-09-17 DIAGNOSIS — M62838 Other muscle spasm: Secondary | ICD-10-CM | POA: Diagnosis not present

## 2021-09-17 DIAGNOSIS — G4486 Cervicogenic headache: Secondary | ICD-10-CM | POA: Diagnosis not present

## 2021-09-17 DIAGNOSIS — M542 Cervicalgia: Secondary | ICD-10-CM | POA: Diagnosis not present

## 2021-09-28 DIAGNOSIS — G4486 Cervicogenic headache: Secondary | ICD-10-CM | POA: Diagnosis not present

## 2021-09-28 DIAGNOSIS — M62838 Other muscle spasm: Secondary | ICD-10-CM | POA: Diagnosis not present

## 2021-09-28 DIAGNOSIS — M542 Cervicalgia: Secondary | ICD-10-CM | POA: Diagnosis not present

## 2021-10-14 DIAGNOSIS — M542 Cervicalgia: Secondary | ICD-10-CM | POA: Diagnosis not present

## 2021-10-14 DIAGNOSIS — G4486 Cervicogenic headache: Secondary | ICD-10-CM | POA: Diagnosis not present

## 2021-10-14 DIAGNOSIS — M62838 Other muscle spasm: Secondary | ICD-10-CM | POA: Diagnosis not present

## 2021-10-24 ENCOUNTER — Inpatient Hospital Stay: Payer: Medicare Other

## 2021-10-24 ENCOUNTER — Other Ambulatory Visit: Payer: Self-pay

## 2021-10-24 ENCOUNTER — Encounter
Admission: EM | Disposition: A | Discharge status: Skilled Nursing Facility | Payer: Self-pay | Source: Home / Self Care | Attending: Internal Medicine

## 2021-10-24 ENCOUNTER — Emergency Department: Payer: Medicare Other

## 2021-10-24 ENCOUNTER — Inpatient Hospital Stay
Admission: EM | Admit: 2021-10-24 | Discharge: 2021-11-03 | DRG: 260 | Disposition: A | Discharge status: Skilled Nursing Facility | Payer: Medicare Other | Attending: Internal Medicine | Admitting: Internal Medicine

## 2021-10-24 DIAGNOSIS — X58XXXA Exposure to other specified factors, initial encounter: Secondary | ICD-10-CM | POA: Diagnosis present

## 2021-10-24 DIAGNOSIS — G2571 Drug induced akathisia: Secondary | ICD-10-CM | POA: Diagnosis present

## 2021-10-24 DIAGNOSIS — Z1152 Encounter for screening for COVID-19: Secondary | ICD-10-CM | POA: Diagnosis not present

## 2021-10-24 DIAGNOSIS — Z9889 Other specified postprocedural states: Secondary | ICD-10-CM | POA: Diagnosis not present

## 2021-10-24 DIAGNOSIS — R278 Other lack of coordination: Secondary | ICD-10-CM | POA: Diagnosis not present

## 2021-10-24 DIAGNOSIS — S82841A Displaced bimalleolar fracture of right lower leg, initial encounter for closed fracture: Secondary | ICD-10-CM | POA: Diagnosis not present

## 2021-10-24 DIAGNOSIS — K219 Gastro-esophageal reflux disease without esophagitis: Secondary | ICD-10-CM | POA: Diagnosis not present

## 2021-10-24 DIAGNOSIS — Z87891 Personal history of nicotine dependence: Secondary | ICD-10-CM

## 2021-10-24 DIAGNOSIS — R069 Unspecified abnormalities of breathing: Secondary | ICD-10-CM | POA: Diagnosis not present

## 2021-10-24 DIAGNOSIS — R2689 Other abnormalities of gait and mobility: Secondary | ICD-10-CM | POA: Diagnosis not present

## 2021-10-24 DIAGNOSIS — R911 Solitary pulmonary nodule: Secondary | ICD-10-CM | POA: Diagnosis not present

## 2021-10-24 DIAGNOSIS — I10 Essential (primary) hypertension: Secondary | ICD-10-CM | POA: Diagnosis not present

## 2021-10-24 DIAGNOSIS — R001 Bradycardia, unspecified: Secondary | ICD-10-CM | POA: Diagnosis present

## 2021-10-24 DIAGNOSIS — R778 Other specified abnormalities of plasma proteins: Secondary | ICD-10-CM | POA: Diagnosis present

## 2021-10-24 DIAGNOSIS — R739 Hyperglycemia, unspecified: Secondary | ICD-10-CM | POA: Diagnosis present

## 2021-10-24 DIAGNOSIS — Z4682 Encounter for fitting and adjustment of non-vascular catheter: Secondary | ICD-10-CM | POA: Diagnosis not present

## 2021-10-24 DIAGNOSIS — R52 Pain, unspecified: Secondary | ICD-10-CM | POA: Diagnosis not present

## 2021-10-24 DIAGNOSIS — Z820 Family history of epilepsy and other diseases of the nervous system: Secondary | ICD-10-CM

## 2021-10-24 DIAGNOSIS — J9811 Atelectasis: Secondary | ICD-10-CM | POA: Diagnosis not present

## 2021-10-24 DIAGNOSIS — E669 Obesity, unspecified: Secondary | ICD-10-CM | POA: Diagnosis present

## 2021-10-24 DIAGNOSIS — E875 Hyperkalemia: Secondary | ICD-10-CM | POA: Diagnosis not present

## 2021-10-24 DIAGNOSIS — I442 Atrioventricular block, complete: Principal | ICD-10-CM | POA: Diagnosis present

## 2021-10-24 DIAGNOSIS — I499 Cardiac arrhythmia, unspecified: Secondary | ICD-10-CM | POA: Diagnosis not present

## 2021-10-24 DIAGNOSIS — Z800 Family history of malignant neoplasm of digestive organs: Secondary | ICD-10-CM

## 2021-10-24 DIAGNOSIS — J449 Chronic obstructive pulmonary disease, unspecified: Secondary | ICD-10-CM | POA: Diagnosis not present

## 2021-10-24 DIAGNOSIS — T43505A Adverse effect of unspecified antipsychotics and neuroleptics, initial encounter: Secondary | ICD-10-CM | POA: Diagnosis present

## 2021-10-24 DIAGNOSIS — E785 Hyperlipidemia, unspecified: Secondary | ICD-10-CM | POA: Diagnosis not present

## 2021-10-24 DIAGNOSIS — Z981 Arthrodesis status: Secondary | ICD-10-CM

## 2021-10-24 DIAGNOSIS — I872 Venous insufficiency (chronic) (peripheral): Secondary | ICD-10-CM | POA: Diagnosis present

## 2021-10-24 DIAGNOSIS — R600 Localized edema: Secondary | ICD-10-CM | POA: Diagnosis not present

## 2021-10-24 DIAGNOSIS — G8918 Other acute postprocedural pain: Secondary | ICD-10-CM | POA: Diagnosis not present

## 2021-10-24 DIAGNOSIS — E1165 Type 2 diabetes mellitus with hyperglycemia: Secondary | ICD-10-CM | POA: Diagnosis not present

## 2021-10-24 DIAGNOSIS — Z6841 Body Mass Index (BMI) 40.0 and over, adult: Secondary | ICD-10-CM | POA: Diagnosis not present

## 2021-10-24 DIAGNOSIS — R296 Repeated falls: Secondary | ICD-10-CM | POA: Diagnosis present

## 2021-10-24 DIAGNOSIS — J9602 Acute respiratory failure with hypercapnia: Secondary | ICD-10-CM | POA: Diagnosis not present

## 2021-10-24 DIAGNOSIS — N179 Acute kidney failure, unspecified: Secondary | ICD-10-CM | POA: Diagnosis present

## 2021-10-24 DIAGNOSIS — R570 Cardiogenic shock: Secondary | ICD-10-CM

## 2021-10-24 DIAGNOSIS — F329 Major depressive disorder, single episode, unspecified: Secondary | ICD-10-CM | POA: Diagnosis present

## 2021-10-24 DIAGNOSIS — Z886 Allergy status to analgesic agent status: Secondary | ICD-10-CM

## 2021-10-24 DIAGNOSIS — I5031 Acute diastolic (congestive) heart failure: Secondary | ICD-10-CM | POA: Diagnosis not present

## 2021-10-24 DIAGNOSIS — J811 Chronic pulmonary edema: Secondary | ICD-10-CM | POA: Diagnosis not present

## 2021-10-24 DIAGNOSIS — Z7982 Long term (current) use of aspirin: Secondary | ICD-10-CM

## 2021-10-24 DIAGNOSIS — B9789 Other viral agents as the cause of diseases classified elsewhere: Secondary | ICD-10-CM | POA: Diagnosis present

## 2021-10-24 DIAGNOSIS — R2681 Unsteadiness on feet: Secondary | ICD-10-CM | POA: Diagnosis not present

## 2021-10-24 DIAGNOSIS — I455 Other specified heart block: Secondary | ICD-10-CM | POA: Diagnosis not present

## 2021-10-24 DIAGNOSIS — Z888 Allergy status to other drugs, medicaments and biological substances status: Secondary | ICD-10-CM

## 2021-10-24 DIAGNOSIS — L03115 Cellulitis of right lower limb: Secondary | ICD-10-CM | POA: Diagnosis present

## 2021-10-24 DIAGNOSIS — S82851D Displaced trimalleolar fracture of right lower leg, subsequent encounter for closed fracture with routine healing: Secondary | ICD-10-CM | POA: Diagnosis not present

## 2021-10-24 DIAGNOSIS — Z833 Family history of diabetes mellitus: Secondary | ICD-10-CM

## 2021-10-24 DIAGNOSIS — M25571 Pain in right ankle and joints of right foot: Secondary | ICD-10-CM | POA: Diagnosis not present

## 2021-10-24 DIAGNOSIS — R0989 Other specified symptoms and signs involving the circulatory and respiratory systems: Secondary | ICD-10-CM | POA: Diagnosis not present

## 2021-10-24 DIAGNOSIS — I441 Atrioventricular block, second degree: Secondary | ICD-10-CM | POA: Diagnosis present

## 2021-10-24 DIAGNOSIS — J9859 Other diseases of mediastinum, not elsewhere classified: Secondary | ICD-10-CM | POA: Diagnosis not present

## 2021-10-24 DIAGNOSIS — Z4789 Encounter for other orthopedic aftercare: Secondary | ICD-10-CM | POA: Diagnosis not present

## 2021-10-24 DIAGNOSIS — Z86010 Personal history of colonic polyps: Secondary | ICD-10-CM

## 2021-10-24 DIAGNOSIS — E876 Hypokalemia: Secondary | ICD-10-CM | POA: Diagnosis not present

## 2021-10-24 DIAGNOSIS — I459 Conduction disorder, unspecified: Secondary | ICD-10-CM

## 2021-10-24 DIAGNOSIS — E872 Acidosis, unspecified: Secondary | ICD-10-CM | POA: Diagnosis not present

## 2021-10-24 DIAGNOSIS — Z743 Need for continuous supervision: Secondary | ICD-10-CM | POA: Diagnosis not present

## 2021-10-24 DIAGNOSIS — G2401 Drug induced subacute dyskinesia: Secondary | ICD-10-CM | POA: Diagnosis present

## 2021-10-24 DIAGNOSIS — S82831A Other fracture of upper and lower end of right fibula, initial encounter for closed fracture: Secondary | ICD-10-CM | POA: Diagnosis not present

## 2021-10-24 DIAGNOSIS — Z818 Family history of other mental and behavioral disorders: Secondary | ICD-10-CM

## 2021-10-24 DIAGNOSIS — K5900 Constipation, unspecified: Secondary | ICD-10-CM | POA: Diagnosis not present

## 2021-10-24 DIAGNOSIS — R079 Chest pain, unspecified: Secondary | ICD-10-CM | POA: Diagnosis not present

## 2021-10-24 DIAGNOSIS — R404 Transient alteration of awareness: Secondary | ICD-10-CM | POA: Diagnosis not present

## 2021-10-24 DIAGNOSIS — S8261XA Displaced fracture of lateral malleolus of right fibula, initial encounter for closed fracture: Secondary | ICD-10-CM | POA: Diagnosis not present

## 2021-10-24 DIAGNOSIS — Z803 Family history of malignant neoplasm of breast: Secondary | ICD-10-CM

## 2021-10-24 DIAGNOSIS — J9622 Acute and chronic respiratory failure with hypercapnia: Secondary | ICD-10-CM | POA: Diagnosis not present

## 2021-10-24 DIAGNOSIS — J9601 Acute respiratory failure with hypoxia: Secondary | ICD-10-CM | POA: Diagnosis present

## 2021-10-24 DIAGNOSIS — S8251XA Displaced fracture of medial malleolus of right tibia, initial encounter for closed fracture: Secondary | ICD-10-CM | POA: Diagnosis not present

## 2021-10-24 DIAGNOSIS — R32 Unspecified urinary incontinence: Secondary | ICD-10-CM | POA: Diagnosis present

## 2021-10-24 DIAGNOSIS — I7121 Aneurysm of the ascending aorta, without rupture: Secondary | ICD-10-CM | POA: Diagnosis not present

## 2021-10-24 DIAGNOSIS — Z8052 Family history of malignant neoplasm of bladder: Secondary | ICD-10-CM

## 2021-10-24 DIAGNOSIS — Z7401 Bed confinement status: Secondary | ICD-10-CM | POA: Diagnosis not present

## 2021-10-24 DIAGNOSIS — S82891A Other fracture of right lower leg, initial encounter for closed fracture: Secondary | ICD-10-CM | POA: Diagnosis not present

## 2021-10-24 DIAGNOSIS — J9621 Acute and chronic respiratory failure with hypoxia: Secondary | ICD-10-CM | POA: Diagnosis not present

## 2021-10-24 DIAGNOSIS — M6281 Muscle weakness (generalized): Secondary | ICD-10-CM | POA: Diagnosis not present

## 2021-10-24 DIAGNOSIS — Z79899 Other long term (current) drug therapy: Secondary | ICD-10-CM

## 2021-10-24 DIAGNOSIS — W19XXXA Unspecified fall, initial encounter: Secondary | ICD-10-CM | POA: Diagnosis not present

## 2021-10-24 DIAGNOSIS — M7989 Other specified soft tissue disorders: Secondary | ICD-10-CM | POA: Diagnosis not present

## 2021-10-24 DIAGNOSIS — M25561 Pain in right knee: Secondary | ICD-10-CM | POA: Diagnosis not present

## 2021-10-24 DIAGNOSIS — N3281 Overactive bladder: Secondary | ICD-10-CM | POA: Diagnosis present

## 2021-10-24 DIAGNOSIS — Z8249 Family history of ischemic heart disease and other diseases of the circulatory system: Secondary | ICD-10-CM

## 2021-10-24 DIAGNOSIS — Z9049 Acquired absence of other specified parts of digestive tract: Secondary | ICD-10-CM

## 2021-10-24 HISTORY — PX: LEFT HEART CATH AND CORONARY ANGIOGRAPHY: CATH118249

## 2021-10-24 HISTORY — PX: TEMPORARY PACEMAKER: CATH118268

## 2021-10-24 HISTORY — DX: Bradycardia, unspecified: R00.1

## 2021-10-24 LAB — CBC
HCT: 47.3 % (ref 39.0–52.0)
HCT: 40.8 % (ref 39.0–52.0)
Hemoglobin: 15.1 g/dL (ref 13.0–17.0)
Hemoglobin: 13.7 g/dL (ref 13.0–17.0)
MCH: 29.5 pg (ref 26.0–34.0)
MCH: 29.3 pg (ref 26.0–34.0)
MCHC: 31.9 g/dL (ref 30.0–36.0)
MCHC: 33.6 g/dL (ref 30.0–36.0)
MCV: 92.4 fL (ref 80.0–100.0)
MCV: 87.4 fL (ref 80.0–100.0)
Platelets: 271 10*3/uL (ref 150–400)
Platelets: 276 10*3/uL (ref 150–400)
RBC: 5.12 MIL/uL (ref 4.22–5.81)
RBC: 4.67 MIL/uL (ref 4.22–5.81)
RDW: 12.3 % (ref 11.5–15.5)
RDW: 12.2 % (ref 11.5–15.5)
WBC: 12.5 10*3/uL — ABNORMAL HIGH (ref 4.0–10.5)
WBC: 16.4 10*3/uL — ABNORMAL HIGH (ref 4.0–10.5)
nRBC: 0 % (ref 0.0–0.2)
nRBC: 0 % (ref 0.0–0.2)

## 2021-10-24 LAB — BLOOD GAS, ARTERIAL
Acid-base deficit: 2.3 mmol/L — ABNORMAL HIGH (ref 0.0–2.0)
Acid-base deficit: 1.3 mmol/L (ref 0.0–2.0)
Bicarbonate: 26.3 mmol/L (ref 20.0–28.0)
Bicarbonate: 23.7 mmol/L (ref 20.0–28.0)
FIO2: 100 %
FIO2: 50 %
MECHVT: 500 mL
MECHVT: 600 mL
Mechanical Rate: 16
O2 Saturation: 99.3 %
O2 Saturation: 98.8 %
PEEP: 5 cmH2O
PEEP: 5 cmH2O
Patient temperature: 37
Patient temperature: 37
RATE: 20 resp/min
pCO2 arterial: 60 mmHg — ABNORMAL HIGH (ref 32–48)
pCO2 arterial: 40 mmHg (ref 32–48)
pH, Arterial: 7.25 — ABNORMAL LOW (ref 7.35–7.45)
pH, Arterial: 7.38 (ref 7.35–7.45)
pO2, Arterial: 158 mmHg — ABNORMAL HIGH (ref 83–108)
pO2, Arterial: 96 mmHg (ref 83–108)

## 2021-10-24 LAB — URINALYSIS, COMPLETE (UACMP) WITH MICROSCOPIC
Bilirubin Urine: NEGATIVE
Glucose, UA: 50 mg/dL — AB
Hgb urine dipstick: NEGATIVE
Ketones, ur: NEGATIVE mg/dL
Leukocytes,Ua: NEGATIVE
Nitrite: NEGATIVE
Protein, ur: 30 mg/dL — AB
Specific Gravity, Urine: 1.012 (ref 1.005–1.030)
Squamous Epithelial / HPF: NONE SEEN (ref 0–5)
pH: 6 (ref 5.0–8.0)

## 2021-10-24 LAB — GLUCOSE, CAPILLARY
Glucose-Capillary: 105 mg/dL — ABNORMAL HIGH (ref 70–99)
Glucose-Capillary: 120 mg/dL — ABNORMAL HIGH (ref 70–99)
Glucose-Capillary: 149 mg/dL — ABNORMAL HIGH (ref 70–99)

## 2021-10-24 LAB — TSH: TSH: 3.066 u[IU]/mL (ref 0.350–4.500)

## 2021-10-24 LAB — MAGNESIUM: Magnesium: 3 mg/dL — ABNORMAL HIGH (ref 1.7–2.4)

## 2021-10-24 LAB — URINE DRUG SCREEN, QUALITATIVE (ARMC ONLY)
Amphetamines, Ur Screen: NOT DETECTED
Barbiturates, Ur Screen: NOT DETECTED
Benzodiazepine, Ur Scrn: POSITIVE — AB
Cannabinoid 50 Ng, Ur ~~LOC~~: POSITIVE — AB
Cocaine Metabolite,Ur ~~LOC~~: NOT DETECTED
MDMA (Ecstasy)Ur Screen: NOT DETECTED
Methadone Scn, Ur: NOT DETECTED
Opiate, Ur Screen: NOT DETECTED
Phencyclidine (PCP) Ur S: NOT DETECTED
Tricyclic, Ur Screen: NOT DETECTED

## 2021-10-24 LAB — BETA-HYDROXYBUTYRIC ACID: Beta-Hydroxybutyric Acid: 1.22 mmol/L — ABNORMAL HIGH (ref 0.05–0.27)

## 2021-10-24 LAB — PROTIME-INR
INR: 1.1 (ref 0.8–1.2)
Prothrombin Time: 14 seconds (ref 11.4–15.2)

## 2021-10-24 LAB — BASIC METABOLIC PANEL
Anion gap: 20 — ABNORMAL HIGH (ref 5–15)
Anion gap: 13 (ref 5–15)
BUN: 15 mg/dL (ref 8–23)
BUN: 19 mg/dL (ref 8–23)
CO2: 17 mmol/L — ABNORMAL LOW (ref 22–32)
CO2: 23 mmol/L (ref 22–32)
Calcium: 8.9 mg/dL (ref 8.9–10.3)
Calcium: 8.2 mg/dL — ABNORMAL LOW (ref 8.9–10.3)
Chloride: 95 mmol/L — ABNORMAL LOW (ref 98–111)
Chloride: 98 mmol/L (ref 98–111)
Creatinine, Ser: 1.43 mg/dL — ABNORMAL HIGH (ref 0.61–1.24)
Creatinine, Ser: 1.12 mg/dL (ref 0.61–1.24)
GFR, Estimated: 54 mL/min — ABNORMAL LOW (ref >60)
GFR, Estimated: >60 mL/min (ref >60)
Glucose, Bld: 272 mg/dL — ABNORMAL HIGH (ref 70–99)
Glucose, Bld: 160 mg/dL — ABNORMAL HIGH (ref 70–99)
Potassium: 5.7 mmol/L — ABNORMAL HIGH (ref 3.5–5.1)
Potassium: 4.2 mmol/L (ref 3.5–5.1)
Sodium: 132 mmol/L — ABNORMAL LOW (ref 135–145)
Sodium: 134 mmol/L — ABNORMAL LOW (ref 135–145)

## 2021-10-24 LAB — ETHANOL: Alcohol, Ethyl (B): <10 mg/dL (ref <10)

## 2021-10-24 LAB — LACTIC ACID, PLASMA
Lactic Acid, Venous: 3.9 mmol/L (ref 0.5–1.9)
Lactic Acid, Venous: 1.3 mmol/L (ref 0.5–1.9)

## 2021-10-24 LAB — D-DIMER, QUANTITATIVE: D-Dimer, Quant: 2.74 ug/mL-FEU — ABNORMAL HIGH (ref 0.00–0.50)

## 2021-10-24 LAB — PHOSPHORUS: Phosphorus: 5 mg/dL — ABNORMAL HIGH (ref 2.5–4.6)

## 2021-10-24 LAB — HEMOGLOBIN A1C
Hgb A1c MFr Bld: 5.6 % (ref 4.8–5.6)
Mean Plasma Glucose: 114.02 mg/dL

## 2021-10-24 LAB — BLOOD GAS, VENOUS
Acid-base deficit: 4.4 mmol/L — ABNORMAL HIGH (ref 0.0–2.0)
Bicarbonate: 24.6 mmol/L (ref 20.0–28.0)
O2 Saturation: 80.6 %
Patient temperature: 37
pCO2, Ven: 60 mmHg (ref 44–60)
pH, Ven: 7.22 — ABNORMAL LOW (ref 7.25–7.43)
pO2, Ven: 56 mmHg — ABNORMAL HIGH (ref 32–45)

## 2021-10-24 LAB — BRAIN NATRIURETIC PEPTIDE: B Natriuretic Peptide: 27.4 pg/mL (ref 0.0–100.0)

## 2021-10-24 LAB — APTT: aPTT: 29 seconds (ref 24–36)

## 2021-10-24 LAB — SALICYLATE LEVEL: Salicylate Lvl: <7 mg/dL — ABNORMAL LOW (ref 7.0–30.0)

## 2021-10-24 LAB — CBG MONITORING, ED: Glucose-Capillary: 267 mg/dL — ABNORMAL HIGH (ref 70–99)

## 2021-10-24 LAB — TROPONIN I (HIGH SENSITIVITY)
Troponin I (High Sensitivity): 7 ng/L (ref <18)
Troponin I (High Sensitivity): 21 ng/L — ABNORMAL HIGH (ref <18)

## 2021-10-24 LAB — CORTISOL: Cortisol, Plasma: 19.4 ug/dL

## 2021-10-24 LAB — ACETAMINOPHEN LEVEL: Acetaminophen (Tylenol), Serum: <10 ug/mL — ABNORMAL LOW (ref 10–30)

## 2021-10-24 LAB — MRSA NEXT GEN BY PCR, NASAL: MRSA by PCR Next Gen: NOT DETECTED

## 2021-10-24 SURGERY — LEFT HEART CATH AND CORONARY ANGIOGRAPHY
Anesthesia: Moderate Sedation

## 2021-10-24 MED ORDER — HEPARIN SODIUM (PORCINE) 5000 UNIT/ML IJ SOLN: 5000.0000 [IU] | Freq: Three times a day (TID) | INTRAMUSCULAR | Status: DC

## 2021-10-24 MED ORDER — SODIUM CHLORIDE 0.9% FLUSH: 3.0000 mL | INTRAVENOUS | Status: DC | PRN

## 2021-10-24 MED ORDER — FENTANYL CITRATE (PF) 100 MCG/2ML IJ SOLN
INTRAMUSCULAR | Status: AC
  Filled 2021-10-24: qty 2

## 2021-10-24 MED ORDER — EPINEPHRINE HCL 5 MG/250ML IV SOLN IN NS
0.5000 ug/min | INTRAVENOUS | Status: DC
  Administered 2021-10-24 (×2): 5 ug/min via INTRAVENOUS
  Filled 2021-10-24: qty 250

## 2021-10-24 MED ORDER — INSULIN ASPART 100 UNIT/ML IJ SOLN: 0.0000 [IU] | INTRAMUSCULAR | Status: DC

## 2021-10-24 MED ORDER — MIDAZOLAM HCL 2 MG/2ML IJ SOLN
INTRAMUSCULAR | Status: AC
  Administered 2021-10-24: 2 mg via INTRAVENOUS
  Filled 2021-10-24: qty 4

## 2021-10-24 MED ORDER — DOCUSATE SODIUM 50 MG/5ML PO LIQD
100.0000 mg | Freq: Two times a day (BID) | ORAL | Status: DC
  Administered 2021-10-24: 100 mg
  Filled 2021-10-24: qty 10

## 2021-10-24 MED ORDER — VENLAFAXINE HCL 37.5 MG PO TABS
75.0000 mg | ORAL_TABLET | Freq: Three times a day (TID) | ORAL | Status: DC
  Administered 2021-10-25: 75 mg
  Filled 2021-10-24 (×2): qty 2

## 2021-10-24 MED ORDER — MIDAZOLAM HCL 2 MG/2ML IJ SOLN
INTRAMUSCULAR | Status: AC
  Filled 2021-10-24: qty 2

## 2021-10-24 MED ORDER — ENOXAPARIN SODIUM 80 MG/0.8ML IJ SOSY
0.5000 mg/kg | PREFILLED_SYRINGE | INTRAMUSCULAR | Status: DC
  Administered 2021-10-24: 77.5 mg via SUBCUTANEOUS
  Filled 2021-10-24: qty 0.8

## 2021-10-24 MED ORDER — FUROSEMIDE 10 MG/ML IJ SOLN
INTRAMUSCULAR | Status: DC | PRN
  Administered 2021-10-24: 40 mg via INTRAVENOUS

## 2021-10-24 MED ORDER — VERAPAMIL HCL 2.5 MG/ML IV SOLN
INTRAVENOUS | Status: DC | PRN
  Administered 2021-10-24: 2.5 mg via INTRA_ARTERIAL

## 2021-10-24 MED ORDER — SODIUM ZIRCONIUM CYCLOSILICATE 5 G PO PACK
10.0000 g | PACK | Freq: Once | ORAL | Status: AC
  Administered 2021-10-24: 10 g
  Filled 2021-10-24: qty 2

## 2021-10-24 MED ORDER — FENTANYL CITRATE PF 50 MCG/ML IJ SOSY
PREFILLED_SYRINGE | INTRAMUSCULAR | Status: AC
  Administered 2021-10-24: 50 ug
  Filled 2021-10-24: qty 1

## 2021-10-24 MED ORDER — INSULIN ASPART 100 UNIT/ML IJ SOLN
0.0000 [IU] | INTRAMUSCULAR | Status: DC
  Administered 2021-10-25 (×2): 3 [IU] via SUBCUTANEOUS
  Filled 2021-10-24 (×2): qty 1

## 2021-10-24 MED ORDER — ALBUTEROL SULFATE (2.5 MG/3ML) 0.083% IN NEBU: 3.0000 mL | INHALATION_SOLUTION | Freq: Four times a day (QID) | RESPIRATORY_TRACT | Status: DC | PRN

## 2021-10-24 MED ORDER — FENTANYL CITRATE (PF) 100 MCG/2ML IJ SOLN
INTRAMUSCULAR | Status: AC
Start: 2021-10-24 — End: ?
  Filled 2021-10-24: qty 2

## 2021-10-24 MED ORDER — ONDANSETRON HCL 4 MG/2ML IJ SOLN
4.0000 mg | Freq: Four times a day (QID) | INTRAMUSCULAR | Status: DC | PRN
  Administered 2021-10-29: 4 mg via INTRAVENOUS

## 2021-10-24 MED ORDER — ASPIRIN 81 MG PO CHEW
81.0000 mg | CHEWABLE_TABLET | Freq: Every day | ORAL | Status: DC
  Administered 2021-10-25: 81 mg
  Filled 2021-10-24: qty 1

## 2021-10-24 MED ORDER — VERAPAMIL HCL 2.5 MG/ML IV SOLN
INTRAVENOUS | Status: AC
  Filled 2021-10-24: qty 2

## 2021-10-24 MED ORDER — HEPARIN SODIUM (PORCINE) 1000 UNIT/ML IJ SOLN
INTRAMUSCULAR | Status: DC | PRN
  Administered 2021-10-24: 6000 [IU] via INTRAVENOUS

## 2021-10-24 MED ORDER — HYDRALAZINE HCL 20 MG/ML IJ SOLN: 10.0000 mg | INTRAMUSCULAR | Status: AC | PRN

## 2021-10-24 MED ORDER — MIDAZOLAM HCL 2 MG/2ML IJ SOLN
INTRAMUSCULAR | Status: DC | PRN
  Administered 2021-10-24: 1 mg via INTRAVENOUS

## 2021-10-24 MED ORDER — IPRATROPIUM-ALBUTEROL 0.5-2.5 (3) MG/3ML IN SOLN
3.0000 mL | RESPIRATORY_TRACT | Status: DC
  Administered 2021-10-24 (×2): 3 mL via RESPIRATORY_TRACT
  Filled 2021-10-24 (×2): qty 3

## 2021-10-24 MED ORDER — SODIUM CHLORIDE 0.9 % IV SOLN: INTRAVENOUS | Status: AC

## 2021-10-24 MED ORDER — HEPARIN (PORCINE) IN NACL 1000-0.9 UT/500ML-% IV SOLN
INTRAVENOUS | Status: AC
  Filled 2021-10-24: qty 1000

## 2021-10-24 MED ORDER — FENTANYL CITRATE PF 50 MCG/ML IJ SOSY: 25.0000 ug | PREFILLED_SYRINGE | INTRAMUSCULAR | Status: DC | PRN

## 2021-10-24 MED ORDER — ACETAMINOPHEN 325 MG PO TABS: 650.0000 mg | ORAL_TABLET | ORAL | Status: DC | PRN

## 2021-10-24 MED ORDER — SODIUM CHLORIDE 0.9 % IV SOLN: 250.0000 mL | INTRAVENOUS | Status: DC | PRN

## 2021-10-24 MED ORDER — IOHEXOL 300 MG/ML  SOLN
INTRAMUSCULAR | Status: DC | PRN
Start: 2021-10-24 — End: 2021-10-24
  Administered 2021-10-24: 60 mL

## 2021-10-24 MED ORDER — CHLORHEXIDINE GLUCONATE CLOTH 2 % EX PADS
6.0000 | MEDICATED_PAD | Freq: Every day | CUTANEOUS | Status: DC
  Administered 2021-10-25 – 2021-11-02 (×5): 6 via TOPICAL

## 2021-10-24 MED ORDER — DOCUSATE SODIUM 50 MG/5ML PO LIQD: 100.0000 mg | Freq: Two times a day (BID) | ORAL | Status: DC | PRN

## 2021-10-24 MED ORDER — ORAL CARE MOUTH RINSE
15.0000 mL | OROMUCOSAL | Status: DC
  Administered 2021-10-24 – 2021-10-25 (×5): 15 mL via OROMUCOSAL

## 2021-10-24 MED ORDER — ROCURONIUM BROMIDE 10 MG/ML (PF) SYRINGE
PREFILLED_SYRINGE | INTRAVENOUS | Status: AC
  Administered 2021-10-24: 100 mg
  Filled 2021-10-24: qty 10

## 2021-10-24 MED ORDER — SODIUM CHLORIDE 0.9 % IV SOLN
0.5000 ug/min | INTRAVENOUS | Status: DC
  Administered 2021-10-25 (×2): 9 ug/min via INTRAVENOUS
  Filled 2021-10-24 (×2): qty 10

## 2021-10-24 MED ORDER — SODIUM CHLORIDE 0.9% FLUSH
3.0000 mL | Freq: Two times a day (BID) | INTRAVENOUS | Status: DC
  Administered 2021-10-24 – 2021-10-25 (×2): 3 mL via INTRAVENOUS

## 2021-10-24 MED ORDER — FAMOTIDINE 20 MG PO TABS
20.0000 mg | ORAL_TABLET | Freq: Two times a day (BID) | ORAL | Status: DC
  Administered 2021-10-24 – 2021-10-25 (×2): 20 mg
  Filled 2021-10-24 (×2): qty 1

## 2021-10-24 MED ORDER — POLYETHYLENE GLYCOL 3350 17 G PO PACK: 17.0000 g | PACK | Freq: Every day | ORAL | Status: DC

## 2021-10-24 MED ORDER — HEPARIN (PORCINE) IN NACL 1000-0.9 UT/500ML-% IV SOLN
INTRAVENOUS | Status: DC | PRN
  Administered 2021-10-24 (×3): 500 mL

## 2021-10-24 MED ORDER — PROPOFOL 1000 MG/100ML IV EMUL
0.0000 ug/kg/min | INTRAVENOUS | Status: DC
  Administered 2021-10-24: 35 ug/kg/min via INTRAVENOUS
  Administered 2021-10-24: 15 ug/kg/min via INTRAVENOUS
  Administered 2021-10-25 (×2): 50 ug/kg/min via INTRAVENOUS
  Administered 2021-10-25: 60 ug/kg/min via INTRAVENOUS
  Filled 2021-10-24 (×5): qty 100

## 2021-10-24 MED ORDER — MIDAZOLAM-SODIUM CHLORIDE 100-0.9 MG/100ML-% IV SOLN
0.5000 mg/h | INTRAVENOUS | Status: DC
  Administered 2021-10-24: 0.5 mg/h via INTRAVENOUS

## 2021-10-24 MED ORDER — LABETALOL HCL 5 MG/ML IV SOLN: 10.0000 mg | INTRAVENOUS | Status: AC | PRN

## 2021-10-24 MED ORDER — INSULIN ASPART 100 UNIT/ML IV SOLN
10.0000 [IU] | Freq: Once | INTRAVENOUS | Status: DC
  Filled 2021-10-24: qty 0.1

## 2021-10-24 MED ORDER — CHLORHEXIDINE GLUCONATE 0.12% ORAL RINSE (MEDLINE KIT)
15.0000 mL | Freq: Two times a day (BID) | OROMUCOSAL | Status: DC
  Administered 2021-10-24 – 2021-11-03 (×11): 15 mL via OROMUCOSAL

## 2021-10-24 MED ORDER — MIDAZOLAM-SODIUM CHLORIDE 100-0.9 MG/100ML-% IV SOLN
INTRAVENOUS | Status: AC
  Administered 2021-10-24: 0.5 mg/h via INTRAVENOUS
  Filled 2021-10-24: qty 100

## 2021-10-24 MED ORDER — FENTANYL CITRATE PF 50 MCG/ML IJ SOSY
100.0000 ug | PREFILLED_SYRINGE | Freq: Once | INTRAMUSCULAR | Status: AC
  Administered 2021-10-24: 100 ug via INTRAVENOUS
  Filled 2021-10-24: qty 2

## 2021-10-24 MED ORDER — SODIUM CHLORIDE 0.9 % IV SOLN
INTRAVENOUS | Status: AC | PRN
  Administered 2021-10-24: 10 mL/h via INTRAVENOUS

## 2021-10-24 MED ORDER — FENTANYL CITRATE (PF) 100 MCG/2ML IJ SOLN
INTRAMUSCULAR | Status: DC | PRN
  Administered 2021-10-24 (×2): 50 ug via INTRAVENOUS

## 2021-10-24 MED ORDER — HEPARIN SODIUM (PORCINE) 1000 UNIT/ML IJ SOLN
INTRAMUSCULAR | Status: AC
Start: 2021-10-24 — End: ?
  Filled 2021-10-24: qty 10

## 2021-10-24 MED ORDER — LIDOCAINE HCL (PF) 1 % IJ SOLN
INTRAMUSCULAR | Status: DC | PRN
  Administered 2021-10-24: 15 mL
  Administered 2021-10-24: 2 mL

## 2021-10-24 MED ORDER — FENTANYL CITRATE PF 50 MCG/ML IJ SOSY
25.0000 ug | PREFILLED_SYRINGE | INTRAMUSCULAR | Status: DC | PRN
  Administered 2021-10-24: 50 ug via INTRAVENOUS
  Administered 2021-10-24 – 2021-10-25 (×2): 100 ug via INTRAVENOUS
  Filled 2021-10-24 (×2): qty 2
  Filled 2021-10-24: qty 1

## 2021-10-24 MED ORDER — MIDAZOLAM HCL 2 MG/2ML IJ SOLN
INTRAMUSCULAR | Status: AC
  Administered 2021-10-24: 2 mg via INTRAVENOUS
  Filled 2021-10-24: qty 2

## 2021-10-24 MED ORDER — POLYETHYLENE GLYCOL 3350 17 G PO PACK: 17.0000 g | PACK | Freq: Every day | ORAL | Status: DC | PRN

## 2021-10-24 MED ORDER — HEPARIN SODIUM (PORCINE) 1000 UNIT/ML IJ SOLN
INTRAMUSCULAR | Status: AC
  Filled 2021-10-24: qty 10

## 2021-10-24 MED ORDER — CARBAMAZEPINE 200 MG PO TABS
200.0000 mg | ORAL_TABLET | Freq: Three times a day (TID) | ORAL | Status: DC
  Administered 2021-10-24 – 2021-10-25 (×2): 200 mg
  Filled 2021-10-24 (×3): qty 1

## 2021-10-24 MED ORDER — LIDOCAINE HCL 1 % IJ SOLN
INTRAMUSCULAR | Status: AC
  Filled 2021-10-24: qty 20

## 2021-10-24 MED ORDER — PROPOFOL 1000 MG/100ML IV EMUL
INTRAVENOUS | Status: AC
  Filled 2021-10-24: qty 100

## 2021-10-24 MED ORDER — MIDAZOLAM HCL 2 MG/2ML IJ SOLN: 2.0000 mg | Freq: Once | INTRAMUSCULAR | Status: AC

## 2021-10-24 MED ORDER — DOCUSATE SODIUM 50 MG/5ML PO LIQD: 100.0000 mg | Freq: Two times a day (BID) | ORAL | Status: DC

## 2021-10-24 MED ORDER — FUROSEMIDE 10 MG/ML IJ SOLN
INTRAMUSCULAR | Status: AC
  Filled 2021-10-24: qty 4

## 2021-10-24 SURGICAL SUPPLY — 21 items
BIOPATCH RED 1 DISK 7.0 (GAUZE/BANDAGES/DRESSINGS) ×2 IMPLANT
CABLE ADAPT CONN TEMP 6FT (ADAPTER) ×1 IMPLANT
CATH 5F 110X4 TIG (CATHETERS) ×1 IMPLANT
CATH 5FR JR4 DIAGNOSTIC (CATHETERS) ×1 IMPLANT
CATH S G BIP PACING (CATHETERS) ×1 IMPLANT
DEVICE RAD COMP TR BAND LRG (VASCULAR PRODUCTS) ×1 IMPLANT
DRAPE BRACHIAL (DRAPES) ×1 IMPLANT
GLIDESHEATH SLEND SS 6F .021 (SHEATH) ×1 IMPLANT
GUIDEWIRE INQWIRE 1.5J.035X260 (WIRE) IMPLANT
INQWIRE 1.5J .035X260CM (WIRE) ×2
NDL PERC 18GX7CM (NEEDLE) IMPLANT
NEEDLE PERC 18GX7CM (NEEDLE) ×2 IMPLANT
PACK CARDIAC CATH (CUSTOM PROCEDURE TRAY) ×2 IMPLANT
PANNUS RETENTION SYSTEM 2 PAD (MISCELLANEOUS) ×1 IMPLANT
PROTECTION STATION PRESSURIZED (MISCELLANEOUS) ×2
SET ATX SIMPLICITY (MISCELLANEOUS) ×1 IMPLANT
SHEATH BRITE TIP 6FR X 23 (SHEATH) ×1 IMPLANT
SLEEVE REPOSITIONING LENGTH 30 (MISCELLANEOUS) ×1 IMPLANT
STATION PROTECTION PRESSURIZED (MISCELLANEOUS) IMPLANT
TUBING CIL FLEX 10 FLL-RA (TUBING) ×1 IMPLANT
WIRE GUIDERIGHT .035X150 (WIRE) ×1 IMPLANT

## 2021-10-24 NOTE — Consult Note (Signed)
? ?[facility]  ?Patient ID: Jared MonteJames P Arenson, MRN: [id number], DOB/AGE: Nov 13, 1955 66 y.o. ?Admit date: 10/24/2021   Date of Consult: 10/24/2021 ?Primary Physician: Nada BoozerHenderson, Wendy, MD ?Primary Cardiologist: None ? ?Chief Complaint:  ?Chief Complaint  ?Patient presents with  ? Bradycardia  ? ?Reason for Consult:  Complete heart block ? ?HPI: 66 y.o. male with no apparent vascular history who has been found unresponsive in his home.  The EMS arrived and the patient was not responsive at the time but was breathing.  At that time the patient had an EMS EKG showing complete heart block with a ventricular rate of 25 bpm.  There was apparent ST depression but no evidence of ST elevation by that EKG.  The patient was immediately started on external pacing for which the patient did have response to external pacing at 70 bpm.  In addition to that the patient did become hypoxic and therefore was intubated and now is oxygenating well without any further issue.  The patient does have hemodynamic stability with no evidence of increased pressures at this time.  He is completely unconscious.  There is no current laboratory work available.  Chest x-ray is being performed at this time.  After long discussion with STEMI and other cardiovascular team it is felt that the patient is reasonably hemodynamically stable at this time and will need a temporary pacemaker wire.  It would be best to perform this temporary pacemaker wire at [facility] instead of further transport.  We are currently awaiting availability in the lab for temporary wire ? ?Past Medical History:  ?Diagnosis Date  ? ADD (attention deficit disorder)   ? Akathisia 01/18/2019  ? Allergy   ? Anemia   ? Anxiety   ? Barrett esophagus   ? Chest pain 08/07/2016  ? Colon polyps   ? Depression   ? Diverticulosis   ? Dysphagia   ? Edema   ? Gait abnormality 03/09/2021  ? GERD (gastroesophageal reflux disease)   ? Hypertension   ? Incontinence   ? urge  ?  Major depressive disorder   ? Obesity   ? Obesity   ? Occipital neuralgia 03/09/2021  ? Bilateral  ? Osteoarthritis of thumbs, bilateral   ? Overactive bladder   ? 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 Tardive akathisia   ? Tardive dyskinesia   ? Thoracic stomach hernia   ? Thyroid disease   ? Thyroid nodule   ? benign  ? Venous insufficiency   ?   ? ?Surgical History:  ?Past Surgical History:  ?Procedure Laterality Date  ? BIOPSY THYROID  2018  ? benign  ? COLONOSCOPY  09/2017  ? [facility]  ? ESOPHAGOGASTRODUODENOSCOPY  09/2017  ? [facility]  ? INGUINAL HERNIA REPAIR Right 1982  ? LAPAROSCOPIC CHOLECYSTECTOMY  2002  ? LUMBAR DISC SURGERY  1999  ? L5-S1 fusion  ? NERVE AND TENDON REPAIR Left 1998  ? left elbow  ? TONSILLECTOMY  1960  ?  ? ?Home Meds: ?Prior to Admission medications   ?Medication Sig Start Date End Date Taking? Authorizing Provider  ?albuterol (PROVENTIL HFA;VENTOLIN HFA) 108 (90 BASE) MCG/ACT inhaler Inhale 2 puffs into the lungs every 6 (six) hours as needed for wheezing or shortness of breath.    [provider]  ?amLODipine (NORVASC) 5 MG tablet Take 5 mg by mouth daily.    [provider]  ?aspirin 325 MG tablet Take 325 mg by mouth daily.     [provider]  ?  carbamazepine (TEGRETOL) 200 MG tablet Take 1 tablet (200 mg total) by mouth 3 (three) times daily. 09/08/21 12/02/22  Penumalli, Glenford Bayley, MD  ?clonazePAM (KLONOPIN) 0.5 MG tablet Take 1 tablet (0.5 mg total) by mouth in the morning, at noon, and at bedtime. 09/08/21 12/02/22  Penumalli, Glenford Bayley, MD  ?Coenzyme Q10 (CO Q 10) 100 MG CAPS Take 100 mg by mouth daily.    [provider]  ?fluticasone (FLONASE) 50 MCG/ACT nasal spray Place 1 spray into both nostrils daily.    [provider]  ?ibuprofen (ADVIL) 200 MG tablet Take 400 mg by mouth. Every 5 hours.    [provider]  ?LIDOCAINE EX Apply topically. 4% ointment.1 application topically 3 times daily as needed.    [provider]  ?loratadine  (CLARITIN) 10 MG tablet Take 10 mg by mouth daily as needed.    [provider]  ?Multiple Vitamin (MULTIVITAMIN) capsule Take 2 capsules by mouth daily.    [provider]  ?oxybutynin (DITROPAN-XL) 10 MG 24 hr tablet Take 20 mg by mouth 2 (two) times daily.    [provider]  ?propranolol (INDERAL) 40 MG tablet TAKE 1 TABLET(40 MG) BY MOUTH TWICE DAILY 09/18/19   York Spaniel, MD  ?traZODone (DESYREL) 100 MG tablet Take 100 mg by mouth at bedtime.    [provider]  ?venlafaxine XR (EFFEXOR-XR) 75 MG 24 hr capsule Take 225 mg by mouth daily with breakfast.    [provider]  ? ? ?Inpatient Medications:  ? midazolam      ? ? epinephrine 5 mcg/min (10/24/21 1314)  ? midazolam 0.5 mg/hr (10/24/21 1333)  ? ? ?Allergies:  ?Allergies  ?Allergen Reactions  ? Aripiprazole Other (See Comments)  ? Naproxen Other (See Comments)  ? Flexeril [Cyclobenzaprine]   ? Gabapentin   ?  Increased acathisia  ? Benadryl [Diphenhydramine Hcl (Sleep)]   ? Omeprazole Diarrhea  ? Robaxin [Methocarbamol] Rash  ? Simvastatin   ? ? ?Social History  ? ?Socioeconomic History  ? Marital status: Married  ?  Spouse name: Jared Carey  ? Number of children: 3  ? Years of education: 16  ? Highest education level: Not on file  ?Occupational History  ?  Comment: disabled  ?Tobacco Use  ? Smoking status: Former  ?  Years: 7.00  ?  Types: Cigarettes  ?  Quit date: 13  ?  Years since quitting: 46.3  ? Smokeless tobacco: Former  ?  Types: Chew  ?  Quit date: 49  ?Vaping Use  ? Vaping Use: Never used  ?Substance and Sexual Activity  ? Alcohol use: Not Currently  ?  Comment: Quit 1985  ? Drug use: Not Currently  ?  Types: Marijuana  ? Sexual activity: Not on file  ?Other Topics Concern  ? Not on file  ?Social History Narrative  ? Lives with Jared Carey  ? Right Handed  ? Drinks caffeinated tea AM 2 cups  ? ?Social Determinants of Health  ? ?Financial Resource Strain: Not on file  ?Food Insecurity: Not on file   ?Transportation Needs: Not on file  ?Physical Activity: Not on file  ?Stress: Not on file  ?Social Connections: Not on file  ?Intimate Partner Violence: Not on file  ?  ? ?Family History  ?Problem Relation Age of Onset  ? Pneumonia Mother   ? Mitral valve prolapse Mother   ? Parkinsonism Mother   ? Diabetes Mother   ? Depression Mother   ?  Hyperthyroidism Mother   ? Heart attack Father   ? Bladder Cancer Father   ? Hyperthyroidism Father   ? Heart disease Father   ? Breast cancer Sister   ? Colon cancer Brother   ?  ? ?Review of Systems ?  ?Labs: ?No results for input(s): CKTOTAL, CKMB, TROPONINI in the last 72 hours. ?Lab Results  ?Component Value Date  ? WBC 12.5 (H) 10/24/2021  ? HGB 15.1 10/24/2021  ? HCT 47.3 10/24/2021  ? MCV 92.4 10/24/2021  ? PLT 271 10/24/2021  ? No results for input(s): NA, K, CL, CO2, BUN, CREATININE, CALCIUM, PROT, BILITOT, ALKPHOS, ALT, AST, GLUCOSE in the last 168 hours. ? ?Invalid input(s): LABALBU ?No results found for: CHOL, HDL, LDLCALC, TRIG ?No results found for: DDIMER ? ?Radiology/Studies:  ?No results found. ? ?EKG: Normal sinus rhythm with complete heart block with a rate of 25 bpm ?Now paced externally ? ?Weights: ?Ceasar Mons Weights  ? 10/24/21 1305  ?Weight: (!) 154.2 kg  ? ? ? ?Physical Exam: ?Blood pressure (!) 163/86, pulse 70, resp. rate (!) 26, height 6' (1.829 m), weight (!) 154.2 kg, SpO2 94 %. Body mass index is 46.11 kg/m?. ?General: Large male greater than 400 pounds not conscious ?Head eyes ears nose throat: Normocephalic, atraumatic, sclera non-icteric, no xanthomas, nares are without discharge. No apparent thyromegaly and/or mass  ?Lungs: Patient is being ventilated no wheezes, no rales, no rhonchi.  ?Heart: RRR with normal S1 S2. no murmur gallop, no rub, PMI is normal size and placement, carotid upstroke normal without bruit, jugular venous pressure is normal ?Abdomen: Soft, non-tender, distended with normoactive bowel sounds. No hepatomegaly. No  rebound/guarding. No obvious abdominal masses. Abdominal aorta is normal size without bruit ?Extremities: 1-2+ lower extremity edema. no cyanosis, no clubbing, no ulcers  ?Peripheral : 2+ bilateral upper extremity pulses, 2+ bil

## 2021-10-24 NOTE — H&P (View-Only) (Signed)
? ?[facility] Consultation Note  ?Patient ID: Jared Carey, MRN: [id number], DOB/AGE: 67/15/1957 65 y.o. ?Admit date: 10/24/2021   Date of Consult: 10/24/2021 ?Primary Physician: Henderson, Wendy, MD ?Primary Cardiologist: None ? ?Chief Complaint:  ?Chief Complaint  ?Patient presents with  ? Bradycardia  ? ?Reason for Consult:  Complete heart block ? ?HPI: 65 y.o. male with no apparent vascular history who has been found unresponsive in his home.  The EMS arrived and the patient was not responsive at the time but was breathing.  At that time the patient had an EMS EKG showing complete heart block with a ventricular rate of 25 bpm.  There was apparent ST depression but no evidence of ST elevation by that EKG.  The patient was immediately started on external pacing for which the patient did have response to external pacing at 70 bpm.  In addition to that the patient did become hypoxic and therefore was intubated and now is oxygenating well without any further issue.  The patient does have hemodynamic stability with no evidence of increased pressures at this time.  He is completely unconscious.  There is no current laboratory work available.  Chest x-ray is being performed at this time.  After long discussion with STEMI and other cardiovascular team it is felt that the patient is reasonably hemodynamically stable at this time and will need a temporary pacemaker wire.  It would be best to perform this temporary pacemaker wire at  [facility] instead of further transport.  We are currently awaiting availability in the lab for temporary wire ? ?Past Medical History:  ?Diagnosis Date  ? ADD (attention deficit disorder)   ? Akathisia 01/18/2019  ? Allergy   ? Anemia   ? Anxiety   ? Barrett esophagus   ? Chest pain 08/07/2016  ? Colon polyps   ? Depression   ? Diverticulosis   ? Dysphagia   ? Edema   ? Gait abnormality 03/09/2021  ? GERD (gastroesophageal reflux disease)   ? Hypertension   ? Incontinence   ? urge  ?  Major depressive disorder   ? Obesity   ? Obesity   ? Occipital neuralgia 03/09/2021  ? Bilateral  ? Osteoarthritis of thumbs, bilateral   ? Overactive bladder   ? Renal infarction (HCC)   ? Tardive akathisia   ? Tardive dyskinesia   ? Thoracic stomach hernia   ? Thyroid disease   ? Thyroid nodule   ? benign  ? Venous insufficiency   ?   ? ?Surgical History:  ?Past Surgical History:  ?Procedure Laterality Date  ? BIOPSY THYROID  2018  ? benign  ? COLONOSCOPY  09/2017  ? [facility]  ? ESOPHAGOGASTRODUODENOSCOPY  09/2017  ? [facility]  ? INGUINAL HERNIA REPAIR Right 1982  ? LAPAROSCOPIC CHOLECYSTECTOMY  2002  ? LUMBAR DISC SURGERY  1999  ? L5-S1 fusion  ? NERVE AND TENDON REPAIR Left 1998  ? left elbow  ? TONSILLECTOMY  1960  ?  ? ?Home Meds: ?Prior to Admission medications   ?Medication Sig Start Date End Date Taking? Authorizing Provider  ?albuterol (PROVENTIL HFA;VENTOLIN HFA) 108 (90 BASE) MCG/ACT inhaler Inhale 2 puffs into the lungs every 6 (six) hours as needed for wheezing or shortness of breath.    [provider]  ?amLODipine (NORVASC) 5 MG tablet Take 5 mg by mouth daily.    [provider]  ?aspirin 325 MG tablet Take 325 mg by mouth daily.     [provider]  ?  carbamazepine (TEGRETOL) 200 MG tablet Take 1 tablet (200 mg total) by mouth 3 (three) times daily. 09/08/21 12/02/22  Penumalli, Glenford Bayley, MD  ?clonazePAM (KLONOPIN) 0.5 MG tablet Take 1 tablet (0.5 mg total) by mouth in the morning, at noon, and at bedtime. 09/08/21 12/02/22  Penumalli, Glenford Bayley, MD  ?Coenzyme Q10 (CO Q 10) 100 MG CAPS Take 100 mg by mouth daily.    [provider]  ?fluticasone (FLONASE) 50 MCG/ACT nasal spray Place 1 spray into both nostrils daily.    [provider]  ?ibuprofen (ADVIL) 200 MG tablet Take 400 mg by mouth. Every 5 hours.    [provider]  ?LIDOCAINE EX Apply topically. 4% ointment.1 application topically 3 times daily as needed.    [provider]  ?loratadine  (CLARITIN) 10 MG tablet Take 10 mg by mouth daily as needed.    [provider]  ?Multiple Vitamin (MULTIVITAMIN) capsule Take 2 capsules by mouth daily.    [provider]  ?oxybutynin (DITROPAN-XL) 10 MG 24 hr tablet Take 20 mg by mouth 2 (two) times daily.    [provider]  ?propranolol (INDERAL) 40 MG tablet TAKE 1 TABLET(40 MG) BY MOUTH TWICE DAILY 09/18/19   York Spaniel, MD  ?traZODone (DESYREL) 100 MG tablet Take 100 mg by mouth at bedtime.    [provider]  ?venlafaxine XR (EFFEXOR-XR) 75 MG 24 hr capsule Take 225 mg by mouth daily with breakfast.    [provider]  ? ? ?Inpatient Medications:  ? midazolam      ? ? epinephrine 5 mcg/min (10/24/21 1314)  ? midazolam 0.5 mg/hr (10/24/21 1333)  ? ? ?Allergies:  ?Allergies  ?Allergen Reactions  ? Aripiprazole Other (See Comments)  ? Naproxen Other (See Comments)  ? Flexeril [Cyclobenzaprine]   ? Gabapentin   ?  Increased acathisia  ? Benadryl [Diphenhydramine Hcl (Sleep)]   ? Omeprazole Diarrhea  ? Robaxin [Methocarbamol] Rash  ? Simvastatin   ? ? ?Social History  ? ?Socioeconomic History  ? Marital status: Married  ?  Spouse name: Boyd Kerbs  ? Number of children: 3  ? Years of education: 16  ? Highest education level: Not on file  ?Occupational History  ?  Comment: disabled  ?Tobacco Use  ? Smoking status: Former  ?  Years: 7.00  ?  Types: Cigarettes  ?  Quit date: 13  ?  Years since quitting: 46.3  ? Smokeless tobacco: Former  ?  Types: Chew  ?  Quit date: 48  ?Vaping Use  ? Vaping Use: Never used  ?Substance and Sexual Activity  ? Alcohol use: Not Currently  ?  Comment: Quit 1985  ? Drug use: Not Currently  ?  Types: Marijuana  ? Sexual activity: Not on file  ?Other Topics Concern  ? Not on file  ?Social History Narrative  ? Lives with Boyd Kerbs  ? Right Handed  ? Drinks caffeinated tea AM 2 cups  ? ?Social Determinants of Health  ? ?Financial Resource Strain: Not on file  ?Food Insecurity: Not on file   ?Transportation Needs: Not on file  ?Physical Activity: Not on file  ?Stress: Not on file  ?Social Connections: Not on file  ?Intimate Partner Violence: Not on file  ?  ? ?Family History  ?Problem Relation Age of Onset  ? Pneumonia Mother   ? Mitral valve prolapse Mother   ? Parkinsonism Mother   ? Diabetes Mother   ? Depression Mother   ?  Hyperthyroidism Mother   ? Heart attack Father   ? Bladder Cancer Father   ? Hyperthyroidism Father   ? Heart disease Father   ? Breast cancer Sister   ? Colon cancer Brother   ?  ? ?Review of Systems ?  ?Labs: ?No results for input(s): CKTOTAL, CKMB, TROPONINI in the last 72 hours. ?Lab Results  ?Component Value Date  ? WBC 12.5 (H) 10/24/2021  ? HGB 15.1 10/24/2021  ? HCT 47.3 10/24/2021  ? MCV 92.4 10/24/2021  ? PLT 271 10/24/2021  ? No results for input(s): NA, K, CL, CO2, BUN, CREATININE, CALCIUM, PROT, BILITOT, ALKPHOS, ALT, AST, GLUCOSE in the last 168 hours. ? ?Invalid input(s): LABALBU ?No results found for: CHOL, HDL, LDLCALC, TRIG ?No results found for: DDIMER ? ?Radiology/Studies:  ?No results found. ? ?EKG: Normal sinus rhythm with complete heart block with a rate of 25 bpm ?Now paced externally ? ?Weights: ?Ceasar Mons Weights  ? 10/24/21 1305  ?Weight: (!) 154.2 kg  ? ? ? ?Physical Exam: ?Blood pressure (!) 163/86, pulse 70, resp. rate (!) 26, height 6' (1.829 m), weight (!) 154.2 kg, SpO2 94 %. Body mass index is 46.11 kg/m?. ?General: Large male greater than 400 pounds not conscious ?Head eyes ears nose throat: Normocephalic, atraumatic, sclera non-icteric, no xanthomas, nares are without discharge. No apparent thyromegaly and/or mass  ?Lungs: Patient is being ventilated no wheezes, no rales, no rhonchi.  ?Heart: RRR with normal S1 S2. no murmur gallop, no rub, PMI is normal size and placement, carotid upstroke normal without bruit, jugular venous pressure is normal ?Abdomen: Soft, non-tender, distended with normoactive bowel sounds. No hepatomegaly. No  rebound/guarding. No obvious abdominal masses. Abdominal aorta is normal size without bruit ?Extremities: 1-2+ lower extremity edema. no cyanosis, no clubbing, no ulcers  ?Peripheral : 2+ bilateral upper extremity pulses, 2+ bil

## 2021-10-24 NOTE — ED Notes (Signed)
Pt is still jerking and chewing the tube. MD made aware. Orders for fentanyl to be placed.  ?

## 2021-10-24 NOTE — ED Triage Notes (Signed)
?  BIB [facility] from home. Found unreponsive with HR of 25. CP, SOB, clammy on scene.  ? ?92/76 ?70 bpm paced ?20G LH ?96% 2 liters ?2mg  versed. ? ?

## 2021-10-24 NOTE — Progress Notes (Addendum)
Chaplain checked on pt w chest pain. No family is present.  Pt now in Cath Lab.   ? ?Update:  checked in on pt in ICU, dau is present. Provided coffee and emotional support.  She will stay until wife arrives in several hours.   ? ?Please call as needed for family/pt support.  ? ? ?Belia Heman, Chaplain  ?Pager: [phone] ? ? ? 10/24/21 1305  ?Clinical Encounter Type  ?Visited With Patient not available  ?Visit Type Initial;Critical Care  ?Referral From Nurse  ?Stress Factors  ?Patient Stress Factors Health changes  ? ? ?

## 2021-10-24 NOTE — ED Notes (Addendum)
100mg  ROCC ?

## 2021-10-24 NOTE — Interval H&P Note (Signed)
History and Physical Interval Note: ? ?10/24/2021 ?3:11 PM ? ?Jared Carey  has presented today for surgery, with the diagnosis of COMPLETE HEART BLOCK.  The various methods of treatment have been discussed with the patient and family. After consideration of risks, benefits and other options for treatment, the patient has consented to  Procedure(s): ?Coronary/Graft Acute MI Revascularization (N/A) ?LEFT HEART CATH AND CORONARY ANGIOGRAPHY (N/A) ?TEMPORARY PACEMAKER (N/A) as a surgical intervention.   ? ?The patient's history has been reviewed, patient examined, no change in status, stable for surgery.  I have reviewed the patient's chart and labs.  Emergency consent implied ? ? ?Cath Lab Visit (complete for each Cath Lab visit) ? ?Clinical Evaluation Leading to the Procedure:  ? ?ACS: Yes.   ? ?Non-ACS:   ? ?Anginal Classification: No Symptoms symptoms unknown, presentation was with complete heart block ? ?Anti-ischemic medical therapy: No Therapy ? ?Non-Invasive Test Results: No non-invasive testing performed ? ?Prior CABG: No previous CABG ? ? ? ?Bryan Lemma ? ? ?

## 2021-10-24 NOTE — ED Notes (Signed)
Spoke to wife penny. [phone] ?

## 2021-10-24 NOTE — ED Notes (Addendum)
4 mg versed

## 2021-10-24 NOTE — ED Notes (Signed)
Intubation....7.5 tube  23 at lip ?

## 2021-10-24 NOTE — ED Provider Notes (Addendum)
? ?[facility] ?Provider Note ? ? ? Event Date/Time  ? First MD Initiated Contact with Patient 10/24/21 1334   ?  (approximate) ? ? ?History  ? ?Bradycardia ? ? ?HPI ? ?Jared Carey is a 66 y.o. male with past medical history of anxiety, anemia, ADD, akathisia, GERD, HTN, depression, obesity and venous insufficiency who presents via EMS from home after he was found unresponsive.  Per EMS he was noted to have heart rate in the 20s with evidence of complete heart block on ECG.  He was immediately started on transcutaneous pacing with improvement of his heart rate to the paced rate at 70.  Patient had improvement in his mental status and was able to state that he just felt dizzy.  It seems he has syncopized a couple times the last couple days.  No recent cough, fevers, chest pain, abdominal pain, nausea, vomiting, diarrhea or rash.  He denies any recent illicit drug use.  Further history is limited from the patient secondary to his pain from ongoing transcutaneous pacing. ? ?  ?Past Medical History:  ?Diagnosis Date  ? ADD (attention deficit disorder)   ? Akathisia 01/18/2019  ? Allergy   ? Anemia   ? Anxiety   ? Barrett esophagus   ? Chest pain 08/07/2016  ? Colon polyps   ? Depression   ? Diverticulosis   ? Dysphagia   ? Edema   ? Gait abnormality 03/09/2021  ? GERD (gastroesophageal reflux disease)   ? Hypertension   ? Incontinence   ? urge  ? Major depressive disorder   ? Obesity   ? Obesity   ? Occipital neuralgia 03/09/2021  ? Bilateral  ? Osteoarthritis of thumbs, bilateral   ? Overactive bladder   ? 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 Tardive akathisia   ? Tardive dyskinesia   ? Thoracic stomach hernia   ? Thyroid disease   ? Thyroid nodule   ? benign  ? Venous insufficiency   ? ? ? ?Physical Exam  ?Triage Vital Signs: ?ED Triage Vitals  ?Enc Vitals Group  ?   BP 10/24/21 1315 (!) 194/18  ?   Pulse Rate 10/24/21 1315 67  ?   Resp 10/24/21 1315 (!) 23  ?   Temp --   ?   Temp src --   ?   SpO2 10/24/21  1315 (!) 80 %  ?   Weight 10/24/21 1305 (!) 340 lb (154.2 kg)  ?   Height 10/24/21 1305 6' (1.829 m)  ?   Head Circumference --   ?   Peak Flow --   ?   Pain Score 10/24/21 1305 10  ?   Pain Loc --   ?   Pain Edu? --   ?   Excl. in [place]? --   ? ? ?Most recent vital signs: ?Vitals:  ? 10/24/21 1400 10/24/21 1415  ?BP: (!) 171/95 (!) 159/86  ?Pulse: 70 70  ?Resp: (!) 25 (!) 35  ?Temp: 98.7 ?F (37.1 ?C) 99 ?F (37.2 ?C)  ?SpO2: 96% 94%  ? ? ?General: Awake, toxic appearing with cool extremities and extremely diaphoretic. ?CV:  Good peripheral perfusion.  Paced rhythm with palpable bilateral radial pulses ?Resp:  Normal effort.  Clear bilaterally. ?Abd:  No distention.  Soft. ?Other:  There is some scattered ecchymosis over the lower extremities and abdomen.  Patient is oriented. ? ? ?ED Results / Procedures / Treatments  ?Labs ?(all labs ordered are listed, but  only abnormal results are displayed) ?Labs Reviewed  ?BASIC METABOLIC PANEL - Abnormal; Notable for the following components:  ?    Result Value  ? Sodium 132 (*)   ? Potassium 5.7 (*)   ? Chloride 95 (*)   ? CO2 17 (*)   ? Glucose, Bld 272 (*)   ? Creatinine, Ser 1.43 (*)   ? GFR, Estimated 54 (*)   ? Anion gap 20 (*)   ? All other components within normal limits  ?CBC - Abnormal; Notable for the following components:  ? WBC 12.5 (*)   ? All other components within normal limits  ?D-DIMER, QUANTITATIVE - Abnormal; Notable for the following components:  ? D-Dimer, Quant 2.74 (*)   ? All other components within normal limits  ?MAGNESIUM - Abnormal; Notable for the following components:  ? Magnesium 3.0 (*)   ? All other components within normal limits  ?LACTIC ACID, PLASMA - Abnormal; Notable for the following components:  ? Lactic Acid, Venous 3.9 (*)   ? All other components within normal limits  ?BLOOD GAS, VENOUS - Abnormal; Notable for the following components:  ? pH, Ven 7.22 (*)   ? pO2, Ven 56 (*)   ? Acid-base deficit 4.4 (*)   ? All other components within  normal limits  ?URINE DRUG SCREEN, QUALITATIVE ([facility] ONLY) - Abnormal; Notable for the following components:  ? Cannabinoid 50 Ng, Ur [place] POSITIVE (*)   ? Benzodiazepine, Ur Scrn POSITIVE (*)   ? All other components within normal limits  ?URINALYSIS, COMPLETE (UACMP) WITH MICROSCOPIC - Abnormal; Notable for the following components:  ? Color, Urine YELLOW (*)   ? APPearance HAZY (*)   ? Glucose, UA 50 (*)   ? Protein, ur 30 (*)   ? Bacteria, UA RARE (*)   ? All other components within normal limits  ?SALICYLATE LEVEL - Abnormal; Notable for the following components:  ? Salicylate Lvl [id number] (*)   ? All other components within normal limits  ?ACETAMINOPHEN LEVEL - Abnormal; Notable for the following components:  ? Acetaminophen (Tylenol), Serum <10 (*)   ? All other components within normal limits  ?CBG MONITORING, ED - Abnormal; Notable for the following components:  ? Glucose-Capillary 267 (*)   ? All other components within normal limits  ?RESP PANEL BY RT-PCR (FLU A&B, COVID) [id number]  ?CULTURE, BLOOD (ROUTINE X 2)  ?CULTURE, BLOOD (ROUTINE X 2)  ?TSH  ?APTT  ?PROTIME-INR  ?ETHANOL  ?BRAIN NATRIURETIC PEPTIDE  ?LACTIC ACID, PLASMA  ?HEMOGLOBIN A1C  ?BETA-HYDROXYBUTYRIC ACID  ?TROPONIN I (HIGH SENSITIVITY)  ?TROPONIN I (HIGH SENSITIVITY)  ? ? ? ?EKG ? ?EMS ECG is remarkable for rated cardia with a ventricular rate in the 20s with what appears to be a complete heart block with some scattered P waves. ? ? ? ?RADIOLOGY ?Postintubation chest x-ray on my interpretation shows ET tube 2 cm above the carina with bilateral pulmonary edema.  I do not see any pneumothorax.  There are some scattered opacities possibly related to low lung volumes versus pneumonia versus edema.  I also reviewed radiologist interpretation. ? ? ?PROCEDURES: ? ?Critical Care performed: No ? ?Procedure Name: Intubation ?Date/Time: 10/24/2021 2:06 PM ?Performed by: Lucrezia Starch, MD ?Pre-anesthesia Checklist: Patient identified, Timeout performed,  Emergency Drugs available, Suction available and Patient being monitored ?Oxygen Delivery Method: Non-rebreather mask ?Preoxygenation: Pre-oxygenation with 100% oxygen ?Induction Type: Rapid sequence ?Tube size: 7.5 mm ?Number of attempts: 1 ?Placement Confirmation: ETT inserted through vocal cords  under direct vision ?Secured at: 23 cm ?Tube secured with: ETT holder ? ? ? ?.Critical Care ?Performed by: Lucrezia Starch, MD ?Authorized by: Lucrezia Starch, MD  ? ?Critical care provider statement:  ?  Critical care time (minutes):  60 ?  Critical care was necessary to treat or prevent imminent or life-threatening deterioration of the following conditions:  Cardiac failure ?  Critical care was time spent personally by me on the following activities:  Development of treatment plan with patient or surrogate, discussions with consultants, evaluation of patient's response to treatment, examination of patient, ordering and review of laboratory studies, ordering and review of radiographic studies, ordering and performing treatments and interventions, pulse oximetry, re-evaluation of patient's condition and review of old charts ?.1-3 Lead EKG Interpretation ?Performed by: Lucrezia Starch, MD ?Authorized by: Lucrezia Starch, MD  ? ?  Interpretation: non-specific   ?  ECG rate assessment: normal   ?  Rhythm: paced   ?External pacer ? ?Date/Time: 10/24/2021 5:19 PM ?Performed by: Lucrezia Starch, MD ?Authorized by: Lucrezia Starch, MD  ?Consent: The procedure was performed in an emergent situation. ?Patient identity confirmed: arm band and provided demographic data ?Local anesthesia used: no ? ?Anesthesia: ?Local anesthesia used: no ? ?Sedation: ?Patient sedated: yes ?Sedatives: fentanyl and midazolam ?Analgesia: fentanyl ? ? ? ? ?The patient is on the cardiac monitor to evaluate for evidence of arrhythmia and/or significant heart rate changes. ? ? ?MEDICATIONS ORDERED IN ED: ?Medications  ?EPINEPHrine (ADRENALIN) 5 mg in  NS 250 mL (0.02 mg/mL) premix infusion ( Intravenous MAR Hold 10/24/21 1506)  ?midazolam (VERSED) 100 mg/100 mL (1 mg/mL) premix infusion (0 mg/hr Intravenous Stopped 10/24/21 1448)  ?insulin aspart (novoLO

## 2021-10-24 NOTE — ED Notes (Signed)
Bolus 4 mg of versed. Pt is twitching and chewing tube ? ?

## 2021-10-24 NOTE — ED Notes (Signed)
Pacer pads switched from EMS to our ZOLL. Paced at 70BPM.  ?

## 2021-10-24 NOTE — Progress Notes (Signed)
PHARMACY CONSULT NOTE - FOLLOW UP ? ?Pharmacy Consult for Electrolyte Monitoring and Replacement  ? ?Recent Labs: ?Potassium (mmol/L)  ?Date Value  ?10/24/2021 5.7 (H)  ? ?Magnesium (mg/dL)  ?Date Value  ?10/24/2021 3.0 (H)  ? ?Calcium (mg/dL)  ?Date Value  ?10/24/2021 8.9  ? ?Albumin (g/dL)  ?Date Value  ?09/13/2021 3.8  ? ?Sodium (mmol/L)  ?Date Value  ?10/24/2021 132 (L)  ? ? ? ?Assessment: ?66 y.o. male with past medical history of anxiety, anemia, ADD, akathisia, GERD, HTN, depression, obesity and venous insufficiency who presented via EMS from home after he was found unresponsive. Pharmacy consulted for electrolyte replacement.  ? ?Goal of Therapy:  ?Electrolytes WNL ? ?Plan:  ?Na 132 - defer to primary team ?K 5.7 - recommended IV insulin + dextrose; MD ordered along with Lokelma 10 g x 1 ?Re-check electrolytes with AM labs ? ? ?Sherilyn Banker ,PharmD ?Clinical Pharmacist ?10/24/2021 4:42 PM ? ?

## 2021-10-24 NOTE — ED Notes (Addendum)
Time out.... intubation ? ?Z. Smith at bedside.  ? ?Pt verbal consent given for intubation.  ?

## 2021-10-24 NOTE — H&P (Signed)
? ?NAME:  Jared Carey, MRN:  [id number], DOB:  1956/06/08, LOS: 0 ?ADMISSION DATE:  10/24/2021, CONSULTATION DATE:  10/24/21  ?REFERRING MD:  Katrinka Blazing MD, CHIEF COMPLAINT:  Bradycardia  ? ?History of Present Illness:  ?66 y.o. male found unresponsive at his home, bradycardic. External pacing initiated by EMS after EKG in the field showed complete heart block. Pacing was discontinued in ED after epinephrine gtt started, however, complete heart block recurred and he became hypoxic. Propofol, Versed initiated for sedation. Patient was subsequently intubated in the ED. Dr. Virl Son (Cardiology) was consulted and recommended cardiac catheterization for coronary angiography (which showed normal coronaries) and temp perm pacemaker placement. ? ?Notable labs include WBC 12.5, Na 132, K 5.7, CO2 17, creatinine 1.43 (baseline ~1.0), lactate 3.9. Trop not elevated, BNP 27. Lactate 3.9. TSH wnl. UDS positive for BZ and THC. Initial VBG showed pH 7.22, pCO2 60. Subsequent ABG showed pH 7.25, pCO2 60, pO2 158. ? ?CXR shows low lung volumes and pulmonary edema. Comment on mediastinal opacity which needs further evaluation on CT chest. ? ?Pertinent  Medical History  ?Hypertension ?GERD ?Depression/anxiety ?Tardive dyskinesia ?Morbid obesity ? ?Significant Hospital Events: ?Including procedures, antibiotic start and stop dates in addition to other pertinent events   ?5/13: intubated, temp perm pacer placed, coronary angiography showed patent coronaries ? ?Interim History / Subjective:  ?N/A ? ?Objective   ?Blood pressure (!) 159/86, pulse 70, temperature 99 ?F (37.2 ?C), resp. rate (!) 35, height 6' (1.829 m), weight (!) 154.2 kg, SpO2 98 %. ?   ?Vent Mode: AC ?FiO2 (%):  [100 %] 100 % ?Set Rate:  [16 bmp] 16 bmp ?Vt Set:  [500 mL-550 mL] 550 mL ?PEEP:  [5 cmH20] 5 cmH20  ?No intake or output data in the 24 hours ending 10/24/21 1610 ?Filed Weights  ? 10/24/21 1305  ?Weight: (!) 154.2 kg  ? ? ?Examination: ?General: obese Caucasian  male, intubated and sedated ?HENT: PERRL, endotracheally intubated ?Lungs: decreased throughout with prolonged expiratory phase, no W/C/R ?Cardiovascular: RRR, no M/R/G, paced rhythm ?Abdomen: obese, soft, non-distended ?Extremities: 1+ pitting edema of BLE, R radial arterial line in place ?Neuro: RASS -4 ?GU: Foley in place ? ?Resolved Hospital Problem list   ?N/A ? 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 ?- Temp perm placed; maintain at target HR ?- Cardiology following, appreciate input ?- Wean epinephrine drip as able to target MAP >65 ?- Hold beta blocker; there is no evidence at this time for beta blocker OD ?- Maintain on mechanical ventilation, target VT of 8 cc/kg IBW ?- Scheduled bronchodilators as he has obstructed flow-volume loops on vent ?- Sedation with propofol, will attempt to wean Versed gtt; PRN fentanyl ?- RASS goal 0/-1 ?- PRN bronchodilators per home ?- Anticipate lactic will improve with improved cardiac output ?- Trend lactate to close ?- Repeat BMP; reverse hyperkalemia ?- 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 ?Best Practice (right click and "Reselect all SmartList Selections" daily)  ? ?Diet/type: tubefeeds ?DVT prophylaxis: LMWH ?GI prophylaxis: H2B ?Lines: Arterial Line and yes and it is still needed ?Foley:  Yes, and it is still needed ?Code Status:  full code ?Last date of multidisciplinary goals of care discussion [5/13] ? ?I Assessed the need for Labs ?I Assessed  the need for Foley ?I Assessed the need for Central Venous Line ?Family Discussion when available ?I Assessed the need for Mobilization ?I made an Assessment of medications to be adjusted  accordingly ?Safety Risk assessment completed ? ?Labs   ?CBC: ?Recent Labs  ?Lab 10/24/21 ?1320  ?WBC 12.5*  ?HGB 15.1  ?HCT 47.3  ?MCV 92.4  ?PLT 271  ? ? ?Basic Metabolic Panel: ?Recent Labs  ?Lab 10/24/21 ?1320 10/24/21 ?1336  ?NA 132*  --   ?K 5.7*  --   ?CL 95*  --   ?CO2 17*  --   ?GLUCOSE 272*  --   ?BUN 15  --   ?CREATININE 1.43*  --   ?CALCIUM 8.9  --   ?MG  --  3.0*  ? ?GFR: ?Estimated Creatinine Clearance: 77.8 mL/min (A) (by C-G formula based on SCr of 1.43 mg/dL (H)). ?Recent Labs  ?Lab 10/24/21 ?1303 10/24/21 ?1320  ?WBC  --  12.5*  ?LATICACIDVEN 3.9*  --   ? ? ?Liver Function Tests: ?No results for input(s): AST, ALT, ALKPHOS, BILITOT, PROT, ALBUMIN in the last 168 hours. ?No results for input(s): LIPASE, AMYLASE in the last 168 hours. ?No results for input(s): AMMONIA in the last 168 hours. ? ?ABG ?   ?Component Value Date/Time  ? PHART 7.25 (L) 10/24/2021 1522  ? PCO2ART 60 (H) 10/24/2021 1522  ? PO2ART 158 (H) 10/24/2021 1522  ? HCO3 26.3 10/24/2021 1522  ? ACIDBASEDEF 2.3 (H) 10/24/2021 1522  ? O2SAT 99.3 10/24/2021 1522  ?  ? ?Coagulation Profile: ?Recent Labs  ?Lab 10/24/21 ?1336  ?INR 1.1  ? ? ?Cardiac Enzymes: ?No results for input(s): CKTOTAL, CKMB, CKMBINDEX, TROPONINI in the last 168 hours. ? ?HbA1C: ?No results found for: HGBA1C ? ?CBG: ?Recent Labs  ?Lab 10/24/21 ?1307  ?GLUCAP 267*  ? ? ?Review of Systems:   ?Unable to assess. ? ?Past Medical History:  ?He,  has a past medical history of ADD (attention deficit disorder), Akathisia (01/18/2019), Allergy, Anemia, Anxiety, Barrett esophagus, Chest pain (08/07/2016), Colon polyps, Depression, Diverticulosis, Dysphagia, Edema, Gait abnormality (03/09/2021), GERD (gastroesophageal reflux disease), Hypertension, Incontinence, Major depressive disorder, Obesity, Obesity, Occipital neuralgia (03/09/2021), Osteoarthritis of thumbs, bilateral, Overactive bladder, Renal infarction (HCC), Tardive akathisia, Tardive dyskinesia, Thoracic stomach hernia,  Thyroid disease, Thyroid nodule, and Venous insufficiency.  ? ?Surgical History:  ? ?Past Surgical History:  ?Procedure Laterality Date  ? BIOPSY THYROID  2018  ? benign  ? COLONOSCOPY  09/2017  ? [facility]  ? ESOPHAGOGASTRODUODENOSCOPY  09/2017  ? [facility]  ? INGUINAL HERNIA REPAIR Right 1982  ? LAPAROSCOPIC CHOLECYSTECTOMY  2002  ? LUMBAR DISC SURGERY  1999  ? L5-S1 fusion  ? NERVE AND TENDON REPAIR Left 1998  ? left elbow  ? TONSILLECTOMY  1960  ?  ? ?Social History:  ? reports that he quit smoking about 46 years ago. His smoking use included cigarettes. He quit smokeless tobacco use about 46 years ago.  His smokeless tobacco use included chew. He reports that he does not currently use alcohol. He reports that he does not currently use drugs after having used the following drugs: Marijuana.  ? ?Family History:  ?His family history includes Bladder Cancer in his father; Breast cancer in his sister; Colon cancer in his brother; Depression in his mother; Diabetes in his mother; Heart attack in his father; Heart disease in his father; Hyperthyroidism in his father and mother; Mitral valve prolapse in his mother; Parkinsonism in his mother; Pneumonia in his mother.  ? ?  Allergies ?Allergies  ?Allergen Reactions  ? Aripiprazole Other (See Comments)  ? Naproxen Other (See Comments)  ? Flexeril [Cyclobenzaprine]   ? Gabapentin   ?  Increased acathisia  ? Benadryl [Diphenhydramine Hcl (Sleep)]   ? Omeprazole Diarrhea  ? Robaxin [Methocarbamol] Rash  ? Simvastatin   ?  ? ?Home Medications  ?Prior to Admission medications   ?Medication Sig Start Date End Date Taking? Authorizing Provider  ?albuterol (PROVENTIL HFA;VENTOLIN HFA) 108 (90 BASE) MCG/ACT inhaler Inhale 2 puffs into the lungs every 6 (six) hours as needed for wheezing or shortness of breath.    [provider]  ?amLODipine (NORVASC) 5 MG tablet Take 5 mg by mouth daily.    [provider]  ?aspirin 325 MG tablet Take 325 mg by mouth daily.     [provider]  ?carbamazepine (TEGRETOL) 200 MG tablet Take 1 tablet (200 mg total) by mouth 3 (three) times daily. 09/08/21 12/02/22  Penumalli, Glenford Bayley, MD  ?clonazePAM (KLONOPIN) 0.5 MG tablet Take 1 tablet (0.5 mg total)

## 2021-10-25 ENCOUNTER — Inpatient Hospital Stay: Payer: Medicare Other

## 2021-10-25 DIAGNOSIS — I442 Atrioventricular block, complete: Principal | ICD-10-CM

## 2021-10-25 LAB — GLUCOSE, CAPILLARY
Glucose-Capillary: 141 mg/dL — ABNORMAL HIGH (ref 70–99)
Glucose-Capillary: 136 mg/dL — ABNORMAL HIGH (ref 70–99)
Glucose-Capillary: 85 mg/dL (ref 70–99)
Glucose-Capillary: 96 mg/dL (ref 70–99)
Glucose-Capillary: 97 mg/dL (ref 70–99)

## 2021-10-25 LAB — RESPIRATORY PANEL BY PCR

## 2021-10-25 LAB — CBC
HCT: 39.1 % (ref 39.0–52.0)
Hemoglobin: 13.2 g/dL (ref 13.0–17.0)
MCH: 29.7 pg (ref 26.0–34.0)
MCHC: 33.8 g/dL (ref 30.0–36.0)
MCV: 87.9 fL (ref 80.0–100.0)
Platelets: 251 10*3/uL (ref 150–400)
RBC: 4.45 MIL/uL (ref 4.22–5.81)
RDW: 12 % (ref 11.5–15.5)
WBC: 11.7 10*3/uL — ABNORMAL HIGH (ref 4.0–10.5)
nRBC: 0 % (ref 0.0–0.2)

## 2021-10-25 LAB — BASIC METABOLIC PANEL
Anion gap: 12 (ref 5–15)
BUN: 19 mg/dL (ref 8–23)
CO2: 23 mmol/L (ref 22–32)
Calcium: 8 mg/dL — ABNORMAL LOW (ref 8.9–10.3)
Chloride: 99 mmol/L (ref 98–111)
Creatinine, Ser: 1.11 mg/dL (ref 0.61–1.24)
GFR, Estimated: >60 mL/min (ref >60)
Glucose, Bld: 155 mg/dL — ABNORMAL HIGH (ref 70–99)
Potassium: 3.3 mmol/L — ABNORMAL LOW (ref 3.5–5.1)
Sodium: 134 mmol/L — ABNORMAL LOW (ref 135–145)

## 2021-10-25 LAB — PHOSPHORUS: Phosphorus: 4.2 mg/dL (ref 2.5–4.6)

## 2021-10-25 LAB — RESP PANEL BY RT-PCR (FLU A&B, COVID) ARPGX2
Influenza A by PCR: NEGATIVE
Influenza B by PCR: NEGATIVE
SARS Coronavirus 2 by RT PCR: NEGATIVE

## 2021-10-25 LAB — CORTISOL: Cortisol, Plasma: 15.2 ug/dL

## 2021-10-25 LAB — HIV ANTIBODY (ROUTINE TESTING W REFLEX): HIV Screen 4th Generation wRfx: NONREACTIVE

## 2021-10-25 LAB — TRIGLYCERIDES: Triglycerides: 304 mg/dL — ABNORMAL HIGH (ref <150)

## 2021-10-25 LAB — POTASSIUM: Potassium: 3.6 mmol/L (ref 3.5–5.1)

## 2021-10-25 LAB — MAGNESIUM: Magnesium: 2.2 mg/dL (ref 1.7–2.4)

## 2021-10-25 MED ORDER — ASPIRIN 81 MG PO CHEW
81.0000 mg | CHEWABLE_TABLET | Freq: Every day | ORAL | Status: DC
  Administered 2021-10-26 – 2021-11-03 (×9): 81 mg via ORAL
  Filled 2021-10-25 (×10): qty 1

## 2021-10-25 MED ORDER — VENLAFAXINE HCL ER 75 MG PO CP24: 225.0000 mg | ORAL_CAPSULE | Freq: Every day | ORAL | Status: DC

## 2021-10-25 MED ORDER — CLONAZEPAM 0.25 MG PO TBDP
0.2500 mg | ORAL_TABLET | Freq: Two times a day (BID) | ORAL | Status: DC
  Administered 2021-10-25 – 2021-11-03 (×19): 0.25 mg via ORAL
  Filled 2021-10-25 (×3): qty 1
  Filled 2021-10-25: qty 2
  Filled 2021-10-25 (×4): qty 1
  Filled 2021-10-25: qty 2
  Filled 2021-10-25 (×2): qty 1
  Filled 2021-10-25: qty 2
  Filled 2021-10-25 (×7): qty 1

## 2021-10-25 MED ORDER — VENLAFAXINE HCL ER 75 MG PO CP24
150.0000 mg | ORAL_CAPSULE | Freq: Every day | ORAL | Status: DC
  Administered 2021-10-26 – 2021-11-03 (×9): 150 mg via ORAL
  Filled 2021-10-25 (×9): qty 2

## 2021-10-25 MED ORDER — VANCOMYCIN HCL 1250 MG/250ML IV SOLN
1250.0000 mg | Freq: Two times a day (BID) | INTRAVENOUS | Status: DC
Start: 2021-10-25 — End: 2021-10-26
  Administered 2021-10-25 – 2021-10-26 (×2): 1250 mg via INTRAVENOUS
  Filled 2021-10-25 (×2): qty 250

## 2021-10-25 MED ORDER — ENOXAPARIN SODIUM 80 MG/0.8ML IJ SOSY
0.5000 mg/kg | PREFILLED_SYRINGE | INTRAMUSCULAR | Status: DC
  Administered 2021-10-25 – 2021-10-27 (×3): 77.5 mg via SUBCUTANEOUS
  Filled 2021-10-25 (×3): qty 0.8

## 2021-10-25 MED ORDER — VENLAFAXINE HCL 37.5 MG PO TABS
75.0000 mg | ORAL_TABLET | Freq: Three times a day (TID) | ORAL | Status: DC
  Filled 2021-10-25 (×2): qty 2

## 2021-10-25 MED ORDER — POTASSIUM CHLORIDE 20 MEQ PO PACK
20.0000 meq | PACK | Freq: Once | ORAL | Status: AC
  Administered 2021-10-25: 20 meq
  Filled 2021-10-25: qty 1

## 2021-10-25 MED ORDER — VANCOMYCIN HCL 2000 MG/400ML IV SOLN
2000.0000 mg | Freq: Once | INTRAVENOUS | Status: AC
  Administered 2021-10-25: 2000 mg via INTRAVENOUS
  Filled 2021-10-25: qty 400

## 2021-10-25 MED ORDER — AMLODIPINE BESYLATE 5 MG PO TABS
5.0000 mg | ORAL_TABLET | Freq: Every day | ORAL | Status: DC
Start: 2021-10-25 — End: 2021-11-03
  Administered 2021-10-25 – 2021-11-03 (×10): 5 mg via ORAL
  Filled 2021-10-25 (×10): qty 1

## 2021-10-25 MED ORDER — POLYETHYLENE GLYCOL 3350 17 G PO PACK: 17.0000 g | PACK | Freq: Every day | ORAL | Status: DC

## 2021-10-25 MED ORDER — INSULIN ASPART 100 UNIT/ML IJ SOLN
0.0000 [IU] | Freq: Three times a day (TID) | INTRAMUSCULAR | Status: DC
  Administered 2021-10-26: 3 [IU] via SUBCUTANEOUS
  Administered 2021-10-26 – 2021-10-27 (×2): 5 [IU] via SUBCUTANEOUS
  Administered 2021-10-28: 1 [IU] via SUBCUTANEOUS
  Administered 2021-10-29: 3 [IU] via SUBCUTANEOUS
  Administered 2021-10-30: 2 [IU] via SUBCUTANEOUS
  Administered 2021-10-30: 1 [IU] via SUBCUTANEOUS
  Administered 2021-10-31 – 2021-11-01 (×2): 2 [IU] via SUBCUTANEOUS
  Administered 2021-11-02: 3 [IU] via SUBCUTANEOUS
  Administered 2021-11-02 – 2021-11-03 (×4): 2 [IU] via SUBCUTANEOUS
  Filled 2021-10-25 (×12): qty 1

## 2021-10-25 MED ORDER — DOCUSATE SODIUM 50 MG/5ML PO LIQD: 100.0000 mg | Freq: Two times a day (BID) | ORAL | Status: DC

## 2021-10-25 MED ORDER — TRAZODONE HCL 100 MG PO TABS
100.0000 mg | ORAL_TABLET | Freq: Every day | ORAL | Status: DC
  Administered 2021-10-25 – 2021-11-02 (×9): 100 mg via ORAL
  Filled 2021-10-25 (×9): qty 1

## 2021-10-25 MED ORDER — CARBAMAZEPINE 200 MG PO TABS
200.0000 mg | ORAL_TABLET | Freq: Three times a day (TID) | ORAL | Status: DC
Start: 2021-10-25 — End: 2021-10-26
  Administered 2021-10-25 – 2021-10-26 (×3): 200 mg via ORAL
  Filled 2021-10-25 (×4): qty 1

## 2021-10-25 MED ORDER — HYDRALAZINE HCL 20 MG/ML IJ SOLN
10.0000 mg | INTRAMUSCULAR | Status: DC | PRN
Start: 2021-10-25 — End: 2021-11-03
  Administered 2021-10-25: 10 mg via INTRAVENOUS
  Filled 2021-10-25: qty 1

## 2021-10-25 MED ORDER — OXYCODONE-ACETAMINOPHEN 5-325 MG PO TABS
1.0000 | ORAL_TABLET | Freq: Four times a day (QID) | ORAL | Status: DC | PRN
  Administered 2021-10-25: 1 via ORAL
  Administered 2021-10-25 – 2021-10-30 (×9): 2 via ORAL
  Administered 2021-10-30 (×2): 1 via ORAL
  Administered 2021-10-30 – 2021-11-02 (×10): 2 via ORAL
  Administered 2021-11-02 – 2021-11-03 (×2): 1 via ORAL
  Filled 2021-10-25 (×16): qty 2
  Filled 2021-10-25 (×4): qty 1
  Filled 2021-10-25 (×2): qty 2
  Filled 2021-10-25: qty 1
  Filled 2021-10-25 (×2): qty 2

## 2021-10-25 MED ORDER — DOCUSATE SODIUM 50 MG/5ML PO LIQD
100.0000 mg | Freq: Two times a day (BID) | ORAL | Status: DC | PRN
Start: 2021-10-25 — End: 2021-10-31

## 2021-10-25 MED ORDER — IPRATROPIUM-ALBUTEROL 0.5-2.5 (3) MG/3ML IN SOLN
3.0000 mL | Freq: Three times a day (TID) | RESPIRATORY_TRACT | Status: DC
  Administered 2021-10-25 – 2021-10-27 (×7): 3 mL via RESPIRATORY_TRACT
  Filled 2021-10-25 (×8): qty 3

## 2021-10-25 MED ORDER — POLYETHYLENE GLYCOL 3350 17 G PO PACK
17.0000 g | PACK | Freq: Every day | ORAL | Status: DC | PRN
Start: 2021-10-25 — End: 2021-11-01

## 2021-10-25 NOTE — Progress Notes (Signed)
? ? ?  Right Radial Sheath Removal with TR Band placement: ? ?Patient was extubated earlier today.  No longer on pressors.  No longer requiring arterial line management and arterial blood draws. ? ?Dressing removed, arterial line set up discontinued.  Sutures removed.   ?Line was aspirated ensuring no clot. ? ?TR band applied and sheath removed.  Hemostasis obtained at initially 13 mL air, 1 minute later 3 mL air were removed with bleed back at equivalent of 11 mL.  We inflated back to 12 mL with hemostasis.  Minimal arterial waveform noted on Pleth with ulnar artery occlusion-improved with the 1 mL air less. ? ?Would continue TR band removal per protocol based on orders. ?Armboard placed to assist with stabilization. ? ?Bryan Lemma, MD ? ?

## 2021-10-25 NOTE — Progress Notes (Signed)
? ?NAME:  Jared Carey, MRN:  [id number], DOB:  08-19-1955, LOS: 1 ?ADMISSION DATE:  10/24/2021, CONSULTATION DATE:  10/25/21  ?REFERRING MD:  Katrinka Blazing MD, CHIEF COMPLAINT:  Bradycardia  ? ?History of Present Illness:  ?66 y.o. male found unresponsive at his home, bradycardic. External pacing initiated by EMS after EKG in the field showed complete heart block. Pacing was discontinued in ED after epinephrine gtt started, however, complete heart block recurred and he became hypoxic. Propofol, Versed initiated for sedation. Patient was subsequently intubated in the ED. Dr. Virl Son (Cardiology) was consulted and recommended cardiac catheterization for coronary angiography (which showed normal coronaries) and temp perm pacemaker placement. ? ?Notable labs include WBC 12.5, Na 132, K 5.7, CO2 17, creatinine 1.43 (baseline ~1.0), lactate 3.9. Trop not elevated, BNP 27. Lactate 3.9. TSH wnl. UDS positive for BZ and THC. Initial VBG showed pH 7.22, pCO2 60. Subsequent ABG showed pH 7.25, pCO2 60, pO2 158. ? ?CXR shows low lung volumes and pulmonary edema. Comment on mediastinal opacity which needs further evaluation on CT chest. Subsequent CT chest showed no mediastinal abnormality. ? ?Pertinent  Medical History  ?Hypertension ?GERD ?Depression/anxiety ?Tardive dyskinesia ?Morbid obesity ? ?Significant Hospital Events: ?Including procedures, antibiotic start and stop dates in addition to other pertinent events   ?5/13: intubated, temp perm pacer placed, coronary angiography showed patent coronaries ? ?Interim History / Subjective:  ?Awake this AM. Ready for SBT and probable extubation. Intrinsic rhythm is above paced rhythm. A-line and temp perm remain in place. Renal function and electrolytes wnl. Good urine output last 24 hours. ? ?Objective   ?Blood pressure 118/71, pulse 89, temperature 99.7 ?F (37.6 ?C), resp. rate 17, height 6' (1.829 m), weight (!) 157.2 kg, SpO2 94 %. ?   ?Vent Mode: PSV ?FiO2 (%):  [40 %-100 %] 40  % ?Set Rate:  [16 bmp-20 bmp] 20 bmp ?Vt Set:  [500 mL-600 mL] 600 mL ?PEEP:  [5 cmH20] 5 cmH20 ?Pressure Support:  [0 cmH20] 0 cmH20 ?Plateau Pressure:  [14 cmH20-21 cmH20] 18 cmH20  ? ?Intake/Output Summary (Last 24 hours) at 10/25/2021 1301 ?Last data filed at 10/25/2021 [phone] ?Gross per 24 hour  ?Intake 1296.17 ml  ?Output 2225 ml  ?Net -928.83 ml  ? ?Filed Weights  ? 10/24/21 1305 10/24/21 1639  ?Weight: (!) 154.2 kg (!) 157.2 kg  ? ? ?Examination: ?General: obese Caucasian male, intubated and sedated ?HENT: PERRL, endotracheally intubated ?Lungs: CTA b/l, no W/C/R ?Cardiovascular: RRR, no M/R/G, paced rhythm ?Abdomen: obese, soft, non-distended ?Extremities: 1+ pitting edema of BLE, R radial arterial line in place ?Neuro: awake, following commands ?GU: Foley in place ? ?Resolved Hospital Problem list   ?Cardiogenic shock ?Acute hypoxic and hypercarbic respiratory failure ?Lactic acidosis ?Hyperkalemia ? 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 ?- Temp perm remains in place ?- Cardiology following, appreciate input ?- Cardiology to remove A-line with sheath and place TR band ?- Off epinephrine drip ?- Holding propranolol ?- SAT/SBT this AM, extubate if able ?- Continue scheduled and PRN bronchodilators ?- Continue home carbamazepine, venlafaxine ? ?Best Practice (right click and "Reselect all SmartList Selections" daily)  ? ?Diet/type: tubefeeds ?DVT prophylaxis: LMWH ?GI prophylaxis: H2B ?Lines: Arterial Line and yes and it is still needed ?Foley:  Yes, and it is still needed ?Code Status:  full code ?Last date of multidisciplinary goals of care discussion [5/13] ? ?I Assessed the  need for Labs ?I Assessed the need for Foley ?I Assessed the need for Central Venous Line ?Family Discussion when available ?I Assessed the need for Mobilization ?I made  an Assessment of medications to be adjusted accordingly ?Safety Risk assessment completed ? ?Labs   ?CBC: ?Recent Labs  ?Lab 10/24/21 ?1320 10/24/21 ?1749 10/25/21 ?0412  ?WBC 12.5* 16.4* 11.7*  ?HGB 15.1 13.7 13.2  ?HCT 47.3 40.8 39.1  ?MCV 92.4 87.4 87.9  ?PLT 271 276 251  ? ? ?Basic Metabolic Panel: ?Recent Labs  ?Lab 10/24/21 ?1320 10/24/21 ?1336 10/24/21 ?1749 10/25/21 ?0412  ?NA 132*  --  134* 134*  ?K 5.7*  --  4.2 3.3*  ?CL 95*  --  98 99  ?CO2 17*  --  23 23  ?GLUCOSE 272*  --  160* 155*  ?BUN 15  --  19 19  ?CREATININE 1.43*  --  1.12 1.11  ?CALCIUM 8.9  --  8.2* 8.0*  ?MG  --  3.0*  --  2.2  ?PHOS  --   --  5.0* 4.2  ? ?GFR: ?Estimated Creatinine Clearance: 101.3 mL/min (by C-G formula based on SCr of 1.11 mg/dL). ?Recent Labs  ?Lab 10/24/21 ?1303 10/24/21 ?1320 10/24/21 ?1749 10/25/21 ?0412  ?WBC  --  12.5* 16.4* 11.7*  ?LATICACIDVEN 3.9*  --  1.3  --   ? ? ?Liver Function Tests: ?No results for input(s): AST, ALT, ALKPHOS, BILITOT, PROT, ALBUMIN in the last 168 hours. ?No results for input(s): LIPASE, AMYLASE in the last 168 hours. ?No results for input(s): AMMONIA in the last 168 hours. ? ?ABG ?   ?Component Value Date/Time  ? PHART 7.38 10/24/2021 2211  ? PCO2ART 40 10/24/2021 2211  ? PO2ART 96 10/24/2021 2211  ? HCO3 23.7 10/24/2021 2211  ? ACIDBASEDEF 1.3 10/24/2021 2211  ? O2SAT 98.8 10/24/2021 2211  ?  ? ?Coagulation Profile: ?Recent Labs  ?Lab 10/24/21 ?1336  ?INR 1.1  ? ? ?Cardiac Enzymes: ?No results for input(s): CKTOTAL, CKMB, CKMBINDEX, TROPONINI in the last 168 hours. ? ?HbA1C: ?Hgb A1c MFr Bld  ?Date/Time Value Ref Range Status  ?10/24/2021 05:49 PM 5.6 4.8 - 5.6 % Final  ?  Comment:  ?  (NOTE) ?Pre diabetes:          5.7%-6.4% ? ?Diabetes:              >6.4% ? ?Glycemic control for   <7.0% ?adults with diabetes ?  ? ? ?CBG: ?Recent Labs  ?Lab 10/24/21 ?1925 10/24/21 ?2342 10/25/21 ?0303 10/25/21 ?6948 10/25/21 ?1108  ?GLUCAP 120* 105* 141* 136* 85  ? ? ?Review of Systems:   ?Unable to  assess. ? ?Past Medical History:  ?He,  has a past medical history of ADD (attention deficit disorder), Akathisia (01/18/2019), Allergy, Anemia, Anxiety, Barrett esophagus, Chest pain (08/07/2016), Colon polyps, Depression, Diverticulosis, Dysphagia, Edema, Gait abnormality (03/09/2021), GERD (gastroesophageal reflux disease), Hypertension, Incontinence, Major depressive disorder, Obesity, Obesity, Occipital neuralgia (03/09/2021), Osteoarthritis of thumbs, bilateral, Overactive bladder, Renal infarction (HCC), Tardive akathisia, Tardive dyskinesia, Thoracic stomach hernia, Thyroid disease, Thyroid nodule, and Venous insufficiency.  ? ?Surgical History:  ? ?Past Surgical History:  ?Procedure Laterality Date  ? BIOPSY THYROID  2018  ? benign  ? COLONOSCOPY  09/2017  ? [facility]  ? ESOPHAGOGASTRODUODENOSCOPY  09/2017  ? [facility]  ? INGUINAL HERNIA REPAIR Right 1982  ? LAPAROSCOPIC CHOLECYSTECTOMY  2002  ? LUMBAR DISC SURGERY  1999  ? L5-S1 fusion  ? NERVE AND TENDON REPAIR Left 1998  ?  left elbow  ? TONSILLECTOMY  1960  ?  ? ?Social History:  ? reports that he quit smoking about 46 years ago. His smoking use included cigarettes. He quit smokeless tobacco use about 46 years ago.  His smokeless tobacco use included chew. He reports that he does not currently use alcohol. He reports that he does not currently use drugs after having used the following drugs: Marijuana.  ? ?Family History:  ?His family history includes Bladder Cancer in his father; Breast cancer in his sister; Colon cancer in his brother; Depression in his mother; Diabetes in his mother; Heart attack in his father; Heart disease in his father; Hyperthyroidism in his father and mother; Mitral valve prolapse in his mother; Parkinsonism in his mother; Pneumonia in his mother.  ? ?Allergies ?Allergies  ?Allergen Reactions  ? Aripiprazole Other (See Comments)  ? Naproxen Other (See Comments)  ? Flexeril [Cyclobenzaprine]   ? Gabapentin   ?  Increased acathisia  ? Benadryl  [Diphenhydramine Hcl (Sleep)]   ? Omeprazole Diarrhea  ? Robaxin [Methocarbamol] Rash  ? Simvastatin   ?  ? ?Home Medications  ?Prior to Admission medications   ?Medication Sig Start Date End Date Taking? Duard BradyAutho

## 2021-10-25 NOTE — Progress Notes (Signed)
PHARMACY CONSULT NOTE - FOLLOW UP ? ?Pharmacy Consult for Electrolyte Monitoring and Replacement  ? ?Recent Labs: ?Potassium (mmol/L)  ?Date Value  ?10/25/2021 3.3 (L)  ? ?Magnesium (mg/dL)  ?Date Value  ?10/25/2021 2.2  ? ?Calcium (mg/dL)  ?Date Value  ?10/25/2021 8.0 (L)  ? ?Albumin (g/dL)  ?Date Value  ?09/13/2021 3.8  ? ?Phosphorus (mg/dL)  ?Date Value  ?10/25/2021 4.2  ? ?Sodium (mmol/L)  ?Date Value  ?10/25/2021 134 (L)  ? ? ? ?Assessment: ?66 y.o. male with past medical history of anxiety, anemia, ADD, akathisia, GERD, HTN, depression, obesity and venous insufficiency who presented via EMS from home after he was found unresponsive. Pharmacy consulted for electrolyte replacement.  ? ?Goal of Therapy:  ?Electrolytes WNL ? ?Plan:  ?K 3.3   NP ordered KCL 20 meq packet Per Tube x1 dose ?F/u electrolytes with AM labs ? ? ?Noralee Space ,PharmD ?Clinical Pharmacist ?10/25/2021 9:37 AM ? ?

## 2021-10-25 NOTE — Progress Notes (Signed)
[facility] Encounter Note ? ?Patient: Jared Carey / Admit Date: 10/24/2021 / Date of Encounter: 10/25/2021, 5:43 PM ? ? ?Subjective: ?The patient is completely recovered from his events yesterday.  After further interview the patient appears to have had some episodes of syncopal concerns over the last 4 to 5 days.  When arriving to the emergency room the patient complete heart block with ventricular rate of 25.  The patient then had a temporary pacemaker wire for which she has done well.  At that time he did have some hyperkalemia and renal dysfunction with continued use of propranolol.  It is unclear whether this is the primary cause of his heart block.  There has been no evidence of coronary artery disease by cardiac catheterization and normal LV systolic function by echocardiogram with no evidence of valvular heart disease.  There is no other primary apparent cause of his heart block.  We have discussed at length with he and the family about the possibility of continued observation with his current evaluation until suggesting that he may be able to avoid pacemaker placement if the patient does not have any further event or heart block.  This would suggest that the propranolol was the primary cause. ? ?Review of Systems: ?Positive for: None ?Negative for: Vision change, hearing change, syncope, dizziness, nausea, vomiting,diarrhea, bloody stool, stomach pain, cough, congestion, diaphoresis, urinary frequency, urinary pain,skin lesions, skin rashes ?Others previously listed ? ?Objective: ?Telemetry: Normal sinus rhythm ?Physical Exam: Blood pressure 118/71, pulse 89, temperature 99.7 ?F (37.6 ?C), resp. rate 17, height 6' (1.829 m), weight (!) 157.2 kg, SpO2 94 %. Body mass index is 47 kg/m?. ?General: Well developed, well nourished, in no acute distress. ?Head: Normocephalic, atraumatic, sclera non-icteric, no xanthomas, nares are without discharge. ?Neck: No apparent masses ?Lungs: Normal  respirations with no wheezes, no rhonchi, no rales , no crackles  ? Heart: Regular rate and rhythm, normal S1 S2, no murmur, no rub, no gallop, PMI is normal size and placement, carotid upstroke normal without bruit, jugular venous pressure normal ?Abdomen: Soft, non-tender, non-distended with normoactive bowel sounds. No hepatosplenomegaly. Abdominal aorta is normal size without bruit ?Extremities: No edema, no clubbing, no cyanosis, no ulcers,  ?Peripheral: 2+ radial, 2+ femoral, 2+ dorsal pedal pulses ?Neuro: Alert and oriented. Moves all extremities spontaneously. ?Psych:  Responds to questions appropriately with a normal affect. ? ? ?Intake/Output Summary (Last 24 hours) at 10/25/2021 1743 ?Last data filed at 10/25/2021 [phone] ?Gross per 24 hour  ?Intake 1296.17 ml  ?Output 2225 ml  ?Net -928.83 ml  ? ? ?Inpatient Medications:  ? amLODipine  5 mg Oral Daily  ? [START ON 10/26/2021] aspirin  81 mg Oral Daily  ? carbamazepine  200 mg Oral TID  ? chlorhexidine gluconate (MEDLINE KIT)  15 mL Mouth Rinse BID  ? Chlorhexidine Gluconate Cloth  6 each Topical Q0600  ? clonazepam  0.25 mg Oral BID  ? docusate  100 mg Oral BID  ? enoxaparin (LOVENOX) injection  0.5 mg/kg Subcutaneous Q24H  ? insulin aspart  0-20 Units Subcutaneous Q4H  ? ipratropium-albuterol  3 mL Nebulization TID  ? [START ON 10/26/2021] polyethylene glycol  17 g Oral Daily  ? [START ON 10/26/2021] venlafaxine XR  150 mg Oral Q breakfast  ? ?Infusions:  ? sodium chloride    ? vancomycin    ? ? ?Labs: ?Recent Labs  ?  10/24/21 ?1336 10/24/21 ?1749 10/25/21 ?0412 10/25/21 ?1343  ?NA  --  134* 134*  --   ?  K  --  4.2 3.3* 3.6  ?CL  --  98 99  --   ?CO2  --  23 23  --   ?GLUCOSE  --  160* 155*  --   ?BUN  --  19 19  --   ?CREATININE  --  1.12 1.11  --   ?CALCIUM  --  8.2* 8.0*  --   ?MG 3.0*  --  2.2  --   ?PHOS  --  5.0* 4.2  --   ? ?No results for input(s): AST, ALT, ALKPHOS, BILITOT, PROT, ALBUMIN in the last 72 hours. ?Recent Labs  ?  10/24/21 ?1749  10/25/21 ?0412  ?WBC 16.4* 11.7*  ?HGB 13.7 13.2  ?HCT 40.8 39.1  ?MCV 87.4 87.9  ?PLT 276 251  ? ?No results for input(s): CKTOTAL, CKMB, TROPONINI in the last 72 hours. ?Invalid input(s): POCBNP ?Recent Labs  ?  10/24/21 ?2355  ?HGBA1C 5.6  ?  ? ?Weights: ?Filed Weights  ? 10/24/21 1305 10/24/21 1639  ?Weight: (!) 154.2 kg (!) 157.2 kg  ? ? ? ?Radiology/Studies:  ?DG Knee 1-2 Views Right ? ?Result Date: 10/25/2021 ?CLINICAL DATA:  Right knee pain EXAM: RIGHT KNEE - 1-2 VIEW COMPARISON:  None Available. FINDINGS: No evidence of fracture, dislocation, or joint effusion. No evidence of significant arthropathy or other focal bone abnormality. Soft tissues are unremarkable. IMPRESSION: No acute osseous abnormality identified. Electronically Signed   By: Ofilia Neas M.D.   On: 10/25/2021 16:30  ? ?CT CHEST WO CONTRAST ? ?Result Date: 10/24/2021 ?CLINICAL DATA:  Bradycardia, abnormal chest x-ray EXAM: CT CHEST WITHOUT CONTRAST TECHNIQUE: Multidetector CT imaging of the chest was performed following the standard protocol without IV contrast. RADIATION DOSE REDUCTION: This exam was performed according to the departmental dose-optimization program which includes automated exposure control, adjustment of the mA and/or kV according to patient size and/or use of iterative reconstruction technique. COMPARISON:  10/24/2021, 08/07/2016 FINDINGS: Cardiovascular: Unenhanced imaging of the heart and great vessels demonstrates no pericardial effusion. 4 cm ascending thoracic aortic aneurysm unchanged. Evaluation of the vascular lumen is limited without IV contrast. Cardiac pacer from inferior approach, lead terminates within the right ventricle. Mediastinum/Nodes: Patient is intubated, endotracheal tube terminating well above carina. Enteric catheter extends into the gastric lumen. 3.2 cm left lobe thyroid nodules unchanged since 2018. No pathologic adenopathy within the mediastinum or hila. Lungs/Pleura: Dependent atelectasis  seen bilaterally. No airspace disease, effusion, or pneumothorax. Minimal mucoid material within the mainstem bronchi. Upper Abdomen: No acute abnormality. Musculoskeletal: No acute or destructive bony lesions. Reconstructed images demonstrate no additional findings. IMPRESSION: 1. Support devices as above. 2. Dependent hypoventilatory changes bilaterally. No acute airspace disease. 3. 4 cm ascending thoracic aortic aneurysm, unchanged. Recommend annual imaging followup by CTA or MRA. This recommendation follows 2010 ACCF/AHA/AATS/ACR/ASA/SCA/SCAI/SIR/STS/SVM Guidelines for the Diagnosis and Management of Patients with Thoracic Aortic Disease. Circulation. 2010; 121: D322-G254. Aortic aneurysm NOS (ICD10-I71.9) Electronically Signed   By: Randa Ngo M.D.   On: 10/24/2021 19:27  ? ?CARDIAC CATHETERIZATION ? ?Result Date: 10/24/2021 ?  LV end diastolic pressure is severely elevated. Successful placement of Temporary Transvenous Pacemaker via 6 French RFV 23 cm sheath -> with loop and RA, of the catheter was advanced to 78-79 cm. => Rate set at 60 bpm, 5 MA with threshold of 1 MA.  Initially set at 70 bpm, reduced to 60. (Patient actually had a intrinsic rhythm upon completion of procedure) Angiographically Normal Coronary Arteries Relatively preserved LVEF (estimated 45 to  50%, with global hypokinesis) EDP of 28-30 mmHg (40 mg IV Lasix administered) RECOMMENDATIONS Continue evaluation for nonischemic etiology for complete heart block EP consultation Continue to Manage Diastolic Heart Failure  ? ?DG Chest Port 1 View ? ?Result Date: 10/24/2021 ?CLINICAL DATA:  Bradycardia. EXAM: PORTABLE CHEST 1 VIEW COMPARISON:  Oct 24, 2021 (1:51 p.m.) FINDINGS: Multiple overlying cardiac lead wires are seen. There is stable endotracheal tube and nasogastric tube positioning. The heart size and mediastinal contours are within normal limits. Low lung volumes are seen with mild areas of bibasilar scarring and/or atelectasis. Interval  improvement of the previously noted pulmonary vascular congestion is seen. There is no evidence of a pleural effusion or pneumothorax. The visualized skeletal structures are unremarkable. IMPRESSION: 1. Low lun

## 2021-10-25 NOTE — Progress Notes (Addendum)
PT Cancellation Note ? ?Patient Details ?Name: Jared Carey ?MRN: [id number] ?DOB: April 18, 1956 ? ? ?Cancelled Treatment:    Reason Eval/Treat Not Completed: Medical issues which prohibited therapy (Consult received and chart reviewed. Per chart review, patient extubated this AM, currently with temp pacer in place. Will hold therapy eval to ensure respiratory stability post exbutation and patient stabilized from cardiac standpoint.  Will continue to follow (noting ortho orders for NWB R LE) and will initiate as medically appropriate.) ? ? ?Jackelyne Sayer H. Manson Passey, PT, DPT, NCS ?10/25/21, 10:56 AM ?[phone] ? ?

## 2021-10-25 NOTE — Progress Notes (Signed)
As night has progressed heart rate has improved, his rate is now above 70 with the internal pacer set at 60. Reliance on epinephrine has improved as well,difficult to sedate at times. Biting on ET tube and raising arms to mouth.  ?

## 2021-10-25 NOTE — Consult Note (Signed)
Reason for Consult: Right ankle fracture ?Referring Physician: Dr. Arta Silence ? ?Jared Carey is an 66 y.o. male.  ?HPI: Patient is a 66 year old who was brought into the hospital secondary to passing out.  This apparently is secondary to cardiac condition and heart block.  He reports that he has had repeated falls recently and it was actually on Thursday 510 that he injured the ankle.  He reports using a walker normally and is not very ambulatory. ? ?Past Medical History:  ?Diagnosis Date  ? ADD (attention deficit disorder)   ? Akathisia 01/18/2019  ? Allergy   ? Anemia   ? Anxiety   ? Barrett esophagus   ? Chest pain 08/07/2016  ? Colon polyps   ? Depression   ? Diverticulosis   ? Dysphagia   ? Edema   ? Gait abnormality 03/09/2021  ? GERD (gastroesophageal reflux disease)   ? Hypertension   ? Incontinence   ? urge  ? Major depressive disorder   ? Obesity   ? Obesity   ? Occipital neuralgia 03/09/2021  ? Bilateral  ? Osteoarthritis of thumbs, bilateral   ? Overactive bladder   ? 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 Tardive akathisia   ? Tardive dyskinesia   ? Thoracic stomach hernia   ? Thyroid disease   ? Thyroid nodule   ? benign  ? Venous insufficiency   ? ? ?Past Surgical History:  ?Procedure Laterality Date  ? BIOPSY THYROID  2018  ? benign  ? COLONOSCOPY  09/2017  ? [facility]  ? ESOPHAGOGASTRODUODENOSCOPY  09/2017  ? [facility]  ? INGUINAL HERNIA REPAIR Right 1982  ? LAPAROSCOPIC CHOLECYSTECTOMY  2002  ? LUMBAR DISC SURGERY  1999  ? L5-S1 fusion  ? NERVE AND TENDON REPAIR Left 1998  ? left elbow  ? TONSILLECTOMY  1960  ? ? ?Family History  ?Problem Relation Age of Onset  ? Pneumonia Mother   ? Mitral valve prolapse Mother   ? Parkinsonism Mother   ? Diabetes Mother   ? Depression Mother   ? Hyperthyroidism Mother   ? Heart attack Father   ? Bladder Cancer Father   ? Hyperthyroidism Father   ? Heart disease Father   ? Breast cancer Sister   ? Colon cancer Brother   ? ? ?Social History:  reports that he quit smoking about 46 years ago.  His smoking use included cigarettes. He quit smokeless tobacco use about 46 years ago.  His smokeless tobacco use included chew. He reports that he does not currently use alcohol. He reports that he does not currently use drugs after having used the following drugs: Marijuana. ? ?Allergies:  ?Allergies  ?Allergen Reactions  ? Aripiprazole Other (See Comments)  ? Naproxen Other (See Comments)  ? Flexeril [Cyclobenzaprine]   ? Gabapentin   ?  Increased acathisia  ? Benadryl [Diphenhydramine Hcl (Sleep)]   ? Omeprazole Diarrhea  ? Robaxin [Methocarbamol] Rash  ? Simvastatin   ? ? ?Medications: I have reviewed the patient's current medications. ? ?Results for orders placed or performed during the hospital encounter of 10/24/21 (from the past 48 hour(s))  ?Lactic acid, plasma     Status: Abnormal  ? Collection Time: 10/24/21  1:03 PM  ?Result Value Ref Range  ? Lactic Acid, Venous 3.9 (HH) 0.5 - 1.9 mmol/L  ?  Comment: CRITICAL RESULT CALLED TO, READ BACK BY AND VERIFIED WITH ?ANGELA ROBBINS ON 10/24/21 AT 1429 QSD ?Performed at [facility], [address]  [place], [place] [address] ?  ?Urine Drug Screen, Qualitative ([facility] only)     Status: Abnormal  ? Collection Time: 10/24/21  1:03 PM  ?Result Value Ref Range  ? Tricyclic, Ur Screen NONE DETECTED NONE DETECTED  ? Amphetamines, Ur Screen NONE DETECTED NONE DETECTED  ? MDMA (Ecstasy)Ur Screen NONE DETECTED NONE DETECTED  ? Cocaine Metabolite,Ur [place] NONE DETECTED NONE DETECTED  ? Opiate, Ur Screen NONE DETECTED NONE DETECTED  ? Phencyclidine (PCP) Ur S NONE DETECTED NONE DETECTED  ? Cannabinoid 50 Ng, Ur [place] POSITIVE (A) NONE DETECTED  ? Barbiturates, Ur Screen NONE DETECTED NONE DETECTED  ? Benzodiazepine, Ur Scrn POSITIVE (A) NONE DETECTED  ? Methadone Scn, Ur NONE DETECTED NONE DETECTED  ?  Comment: (NOTE) ?Tricyclics + metabolites, urine    Cutoff 1000 ng/mL ?Amphetamines + metabolites, urine  Cutoff 1000 ng/mL ?MDMA (Ecstasy), urine              Cutoff 500  ng/mL ?Cocaine Metabolite, urine          Cutoff 300 ng/mL ?Opiate + metabolites, urine        Cutoff 300 ng/mL ?Phencyclidine (PCP), urine         Cutoff 25 ng/mL ?Cannabinoid, urine                 Cutoff 50 ng/mL ?Barbiturates + metabolites, urine  Cutoff 200 ng/mL ?Benzodiazepine, urine              Cutoff 200 ng/mL ?Methadone, urine                   Cutoff 300 ng/mL ? ?The urine drug screen provides only a preliminary, unconfirmed ?analytical test result and should not be used for non-medical ?purposes. Clinical consideration and professional judgment should ?be applied to any positive drug screen result due to possible ?interfering substances. A more specific alternate chemical method ?must be used in order to obtain a confirmed analytical result. ?Gas chromatography / mass spectrometry (GC/MS) is the preferred ?confirm atory method. ?Performed at [facility], [address] [place], ?[place] [address] ?  ?Urinalysis, Complete w Microscopic     Status: Abnormal  ? Collection Time: 10/24/21  1:03 PM  ?Result Value Ref Range  ? Color, Urine YELLOW (A) YELLOW  ? APPearance HAZY (A) CLEAR  ? Specific Gravity, Urine 1.012 1.005 - 1.030  ? pH 6.0 5.0 - 8.0  ? Glucose, UA 50 (A) NEGATIVE mg/dL  ? Hgb urine dipstick NEGATIVE NEGATIVE  ? Bilirubin Urine NEGATIVE NEGATIVE  ? Ketones, ur NEGATIVE NEGATIVE mg/dL  ? Protein, ur 30 (A) NEGATIVE mg/dL  ? Nitrite NEGATIVE NEGATIVE  ? Leukocytes,Ua NEGATIVE NEGATIVE  ? RBC / HPF 0-5 0 - 5 RBC/hpf  ? WBC, UA 0-5 0 - 5 WBC/hpf  ? Bacteria, UA RARE (A) NONE SEEN  ? Squamous Epithelial / LPF NONE SEEN 0 - 5  ? Mucus PRESENT   ? Granular Casts, UA PRESENT   ?  Comment: Performed at [facility], [address], [place], [place] [address]  ?CBG monitoring, ED     Status: Abnormal  ? Collection Time: 10/24/21  1:07 PM  ?Result Value Ref Range  ? Glucose-Capillary 267 (H) 70 - 99 mg/dL  ?  Comment: Glucose reference range applies only to samples taken after  fasting for at least 8 hours.  ?Basic metabolic panel     Status: Abnormal  ? Collection Time: 10/24/21  1:20 PM  ?Result Value Ref  Range  ? Sodium 132 (L) 135 - 145 mmol/L  ? Potassium 5.7 (H) 3.5 - 5.1 mmol/L  ? Chloride 95 (L) 98 - 111 mmol/L  ? CO2 17 (L) 22 - 32 mmol/L  ? Glucose, Bld 272 (H) 70 - 99 mg/dL  ?  Comment: Glucose reference range applies only to samples taken after fasting for at least 8 hours.  ? BUN 15 8 - 23 mg/dL  ? Creatinine, Ser 1.43 (H) 0.61 - 1.24 mg/dL  ? Calcium 8.9 8.9 - 10.3 mg/dL  ? GFR, Estimated 54 (L) >60 mL/min  ?  Comment: (NOTE) ?Calculated using the CKD-EPI Creatinine Equation (2021) ?  ? Anion gap 20 (H) 5 - 15  ?  Comment: Performed at [facility], [address] [place], [place] [address]  ?CBC     Status: Abnormal  ? Collection Time: 10/24/21  1:20 PM  ?Result Value Ref Range  ? WBC 12.5 (H) 4.0 - 10.5 K/uL  ? RBC 5.12 4.22 - 5.81 MIL/uL  ? Hemoglobin 15.1 13.0 - 17.0 g/dL  ? HCT 47.3 39.0 - 52.0 %  ? MCV 92.4 80.0 - 100.0 fL  ? MCH 29.5 26.0 - 34.0 pg  ? MCHC 31.9 30.0 - 36.0 g/dL  ? RDW 12.3 11.5 - 15.5 %  ? Platelets 271 150 - 400 K/uL  ? nRBC 0.0 0.0 - 0.2 %  ?  Comment: Performed at [facility], [address] [place], [place] [address]  ?Troponin I (High Sensitivity)     Status: None  ? Collection Time: 10/24/21  1:20 PM  ?Result Value Ref Range  ? Troponin I (High Sensitivity) 7 <18 ng/L  ?  Comment: (NOTE) ?Elevated high sensitivity troponin I (hsTnI) values and significant  ?changes across serial measurements may suggest ACS but many other  ?chronic and acute conditions are known to elevate hsTnI results.  ?Refer to the "Links" section for chest pain algorithms and additional  ?guidance. ?Performed at [facility], [address] [place], ?[place] [address] ?  ?D-dimer, quantitative     Status: Abnormal  ? Collection Time: 10/24/21  1:20 PM  ?Result Value Ref Range  ? D-Dimer, Quant 2.74 (H) 0.00 - 0.50 ug/mL-FEU  ?  Comment:  (NOTE) ?At the manufacturer cut-off value of 0.5 ?g/mL FEU, this assay has a ?negative predictive value of 95-100%.This assay is intended for use ?in conjunction with a clinical pretest probability (PTP) assessm

## 2021-10-25 NOTE — Progress Notes (Signed)
Pt was suctioned for a moderate amount of thick tan secretions. Per Dr's order, he was extubated. He is voicing and is without stridor. He was placed on a 2 L nasal cannula. ?

## 2021-10-25 NOTE — Consult Note (Signed)
Pharmacy Antibiotic Note ? ?Jared Carey is a 66 y.o. male admitted on 10/24/2021 after being found unresponsive at his home and bradycardic.  Pharmacy has been consulted for Vancomycin dosing for cellulits. ? ?Plan: ?Vancomycin 2000 mg IV x 1 as loading dose followed by 1250 mg 12 hrs. Goal AUC 400-550. ?Expected AUC: 496.7 ?Expected Css min: 15.1 ?SCr used: 1.11 ? ? ?Height: 6' (182.9 cm) ?Weight: (!) 157.2 kg (346 lb 9 oz) ?IBW/kg (Calculated) : 77.6 ? ?Temp (24hrs), Avg:98.8 ?F (37.1 ?C), Min:97.5 ?F (36.4 ?C), Max:99.9 ?F (37.7 ?C) ? ?Recent Labs  ?Lab 10/24/21 ?1303 10/24/21 ?1320 10/24/21 ?1749 10/25/21 ?0412  ?WBC  --  12.5* 16.4* 11.7*  ?CREATININE  --  1.43* 1.12 1.11  ?LATICACIDVEN 3.9*  --  1.3  --   ?  ?Estimated Creatinine Clearance: 101.3 mL/min (by C-G formula based on SCr of 1.11 mg/dL).   ? ?Allergies  ?Allergen Reactions  ? Aripiprazole Other (See Comments)  ? Naproxen Other (See Comments)  ? Flexeril [Cyclobenzaprine]   ? Gabapentin   ?  Increased acathisia  ? Benadryl [Diphenhydramine Hcl (Sleep)]   ? Omeprazole Diarrhea  ? Robaxin [Methocarbamol] Rash  ? Simvastatin   ? ? ?Antimicrobials this admission: ?Vanc 5/14 >>  ? ?Dose adjustments this admission: ?N/a ? ?Microbiology results: ?5/13 BCx: NGTD ?5/13 Resp cx: sent  ?5/13 MRSA PCR: negative ? ?Thank you for allowing pharmacy to be a part of this patient?s care. ? ?Pearla Dubonnet ?10/25/2021 9:22 AM ? ?

## 2021-10-26 ENCOUNTER — Encounter: Payer: Self-pay | Admitting: Cardiology

## 2021-10-26 LAB — GLUCOSE, CAPILLARY
Glucose-Capillary: 83 mg/dL (ref 70–99)
Glucose-Capillary: 106 mg/dL — ABNORMAL HIGH (ref 70–99)
Glucose-Capillary: 215 mg/dL — ABNORMAL HIGH (ref 70–99)
Glucose-Capillary: 154 mg/dL — ABNORMAL HIGH (ref 70–99)
Glucose-Capillary: 125 mg/dL — ABNORMAL HIGH (ref 70–99)

## 2021-10-26 LAB — CBC
HCT: 40.2 % (ref 39.0–52.0)
Hemoglobin: 13.3 g/dL (ref 13.0–17.0)
MCH: 29.3 pg (ref 26.0–34.0)
MCHC: 33.1 g/dL (ref 30.0–36.0)
MCV: 88.5 fL (ref 80.0–100.0)
Platelets: 215 10*3/uL (ref 150–400)
RBC: 4.54 MIL/uL (ref 4.22–5.81)
RDW: 12.2 % (ref 11.5–15.5)
WBC: 9.1 10*3/uL (ref 4.0–10.5)
nRBC: 0 % (ref 0.0–0.2)

## 2021-10-26 LAB — BASIC METABOLIC PANEL
Anion gap: 14 (ref 5–15)
BUN: 11 mg/dL (ref 8–23)
CO2: 19 mmol/L — ABNORMAL LOW (ref 22–32)
Calcium: 8.4 mg/dL — ABNORMAL LOW (ref 8.9–10.3)
Chloride: 105 mmol/L (ref 98–111)
Creatinine, Ser: 0.93 mg/dL (ref 0.61–1.24)
GFR, Estimated: >60 mL/min (ref >60)
Glucose, Bld: 95 mg/dL (ref 70–99)
Potassium: 3.5 mmol/L (ref 3.5–5.1)
Sodium: 138 mmol/L (ref 135–145)

## 2021-10-26 LAB — PHOSPHORUS: Phosphorus: 3 mg/dL (ref 2.5–4.6)

## 2021-10-26 LAB — MAGNESIUM: Magnesium: 2.3 mg/dL (ref 1.7–2.4)

## 2021-10-26 MED ORDER — POTASSIUM CHLORIDE CRYS ER 20 MEQ PO TBCR
40.0000 meq | EXTENDED_RELEASE_TABLET | Freq: Once | ORAL | Status: AC
  Administered 2021-10-26: 40 meq via ORAL
  Filled 2021-10-26: qty 2

## 2021-10-26 MED ORDER — GUAIFENESIN 100 MG/5ML PO LIQD
5.0000 mL | ORAL | Status: DC | PRN
  Administered 2021-11-02: 5 mL via ORAL
  Filled 2021-10-26: qty 10

## 2021-10-26 MED ORDER — FUROSEMIDE 10 MG/ML IJ SOLN
20.0000 mg | Freq: Once | INTRAMUSCULAR | Status: DC
  Filled 2021-10-26: qty 2

## 2021-10-26 MED ORDER — ALUM & MAG HYDROXIDE-SIMETH 200-200-20 MG/5ML PO SUSP: 30.0000 mL | Freq: Four times a day (QID) | ORAL | Status: DC | PRN

## 2021-10-26 MED ORDER — DM-GUAIFENESIN ER 30-600 MG PO TB12: 1.0000 | ORAL_TABLET | Freq: Two times a day (BID) | ORAL | Status: DC

## 2021-10-26 MED ORDER — CARBAMAZEPINE 200 MG PO TABS
200.0000 mg | ORAL_TABLET | Freq: Two times a day (BID) | ORAL | Status: DC
  Administered 2021-10-26 – 2021-11-03 (×16): 200 mg via ORAL
  Filled 2021-10-26 (×16): qty 1

## 2021-10-26 MED ORDER — TAMSULOSIN HCL 0.4 MG PO CAPS
0.4000 mg | ORAL_CAPSULE | Freq: Every day | ORAL | Status: AC
  Administered 2021-10-27 – 2021-10-29 (×3): 0.4 mg via ORAL
  Filled 2021-10-26 (×3): qty 1

## 2021-10-26 MED ORDER — FAMOTIDINE 20 MG PO TABS
20.0000 mg | ORAL_TABLET | Freq: Every day | ORAL | Status: DC
  Administered 2021-10-26 – 2021-11-03 (×9): 20 mg via ORAL
  Filled 2021-10-26 (×9): qty 1

## 2021-10-26 MED ORDER — SODIUM CHLORIDE 0.9 % IV SOLN
3.0000 g | Freq: Four times a day (QID) | INTRAVENOUS | Status: DC
  Administered 2021-10-26 – 2021-10-29 (×12): 3 g via INTRAVENOUS
  Administered 2021-10-29: 100 mL via INTRAVENOUS
  Administered 2021-10-30 (×3): 3 g via INTRAVENOUS
  Filled 2021-10-26: qty 8
  Filled 2021-10-26 (×4): qty 3
  Filled 2021-10-26: qty 8
  Filled 2021-10-26: qty 3
  Filled 2021-10-26 (×2): qty 8
  Filled 2021-10-26: qty 3
  Filled 2021-10-26: qty 8
  Filled 2021-10-26: qty 3
  Filled 2021-10-26: qty 8
  Filled 2021-10-26: qty 3
  Filled 2021-10-26 (×4): qty 8
  Filled 2021-10-26: qty 3

## 2021-10-26 NOTE — Clinical Note (Incomplete)
Decent night for patient, has dry non productive cough at this time, states he usually takes flonase on a daily basis at home, notified NP orders placed. Also stated that he has dizziness most of the time and that he has been told that he has crystals in his ears, order place for vestibular pt ?

## 2021-10-26 NOTE — Progress Notes (Signed)
PHARMACY CONSULT NOTE ? ?Pharmacy Consult for Electrolyte Monitoring and Replacement  ? ?Recent Labs: ?Potassium (mmol/L)  ?Date Value  ?10/26/2021 3.5  ? ?Magnesium (mg/dL)  ?Date Value  ?10/26/2021 2.3  ? ?Calcium (mg/dL)  ?Date Value  ?10/26/2021 8.4 (L)  ? ?Albumin (g/dL)  ?Date Value  ?09/13/2021 3.8  ? ?Phosphorus (mg/dL)  ?Date Value  ?10/26/2021 3.0  ? ?Sodium (mmol/L)  ?Date Value  ?10/26/2021 138  ? ? ?Assessment: ?66 y.o. male with past medical history of anxiety, anemia, ADD, akathisia, GERD, HTN, depression, obesity and venous insufficiency who presented via EMS from home after he was found unresponsive. Pharmacy consulted for electrolyte replacement.  ? ?Goal of Therapy:  ?Electrolytes WNL ? ?Plan:  ?No electrolyte replacement warranted for today ?F/u electrolytes with AM labs ? ? ?Lowella Bandy ,PharmD ?Clinical Pharmacist ?10/26/2021 7:08 AM ? ?

## 2021-10-26 NOTE — Progress Notes (Signed)
? ?NAME:  Jared Carey, MRN:  [id number], DOB:  06-28-1955, LOS: 2 ?ADMISSION DATE:  10/24/2021, CONSULTATION DATE:  10/26/21  ?REFERRING MD:  Tamala Julian MD, CHIEF COMPLAINT:  Bradycardia  ? ?History of Present Illness:  ?66 y.o. male found unresponsive at his home, bradycardic. External pacing initiated by EMS after EKG in the field showed complete heart block. Pacing was discontinued in ED after epinephrine gtt started, however, complete heart block recurred and he became hypoxic. Propofol, Versed initiated for sedation. Patient was subsequently intubated in the ED. Dr. Carie Caddy (Cardiology) was consulted and recommended cardiac catheterization for coronary angiography (which showed normal coronaries) and temp perm pacemaker placement. ? ?Notable labs include WBC 12.5, Na 132, K 5.7, CO2 17, creatinine 1.43 (baseline ~1.0), lactate 3.9. Trop not elevated, BNP 27. Lactate 3.9. TSH wnl. UDS positive for BZ and THC. Initial VBG showed pH 7.22, pCO2 60. Subsequent ABG showed pH 7.25, pCO2 60, pO2 158. ? ?CXR shows low lung volumes and pulmonary edema. Comment on mediastinal opacity which needs further evaluation on CT chest. Subsequent CT chest showed no mediastinal abnormality. ? ?Pertinent  Medical History  ?Hypertension ?GERD ?Depression/anxiety ?Tardive dyskinesia ?Morbid obesity ? ?Significant Hospital Events: ?Including procedures, antibiotic start and stop dates in addition to other pertinent events   ?5/13: intubated, temp perm pacer placed, coronary angiography showed patent coronaries ?5/14: EXTUBATED. Ankle fx evaluated by Ortho, plan to treat nonoperatively for now.  Pressors weaned off. Right radial sheath removed with TR band placed. ?5/15: Temporary pacer wire removed. ABX deescalated from Vancomycin to Unasyn (cellulitis).  Consider Transfer to progressive cardiac unit. ? ?Interim History / Subjective:  ?-Extubated yesterday, respiratory status stable, on room air ?-No acute events noted  overnight ?-Temporary pacing wire removed this morning, currently sinus brady (HR 50's), BP stable  ?-Currently denies dizziness ar rest, did experience some with getting up to bathroom this morning ?-Also reports abdominal discomfort and gas ? ?Objective   ?Blood pressure (!) 127/56, pulse (!) 53, temperature 99.9 ?F (37.7 ?C), resp. rate 13, height 6' (1.829 m), weight (!) 157.2 kg, SpO2 (!) 88 %. ?   ?   ? ?Intake/Output Summary (Last 24 hours) at 10/26/2021 1049 ?Last data filed at 10/25/2021 1900 ?Gross per 24 hour  ?Intake 10.32 ml  ?Output 1250 ml  ?Net -1239.68 ml  ? ? ?Filed Weights  ? 10/24/21 1305 10/24/21 1639  ?Weight: (!) 154.2 kg (!) 157.2 kg  ? ? ?Examination: ?General: Acutely ill appearing obese Caucasian male, sitting in bed, on RA, in NAD ?HENT: Atraumatic, normocephalic, neck supple, no JVD, PERRL,  ?Lungs: CTA b/l, no W/C/R, even, nonlabored ?Cardiovascular: Bradycardia, regular rhythm, no M/R/G ?Abdomen: obese, soft, non-distended, nontender ?Extremities: 1+ pitting edema of BLE ?Neuro: awake and alert, oriented x4, no focal deficits ?GU: Foley in place ? ?Resolved Hospital Problem list   ?Cardiogenic shock ?Acute hypoxic and hypercarbic respiratory failure ?Lactic acidosis ?Hyperkalemia ? ?Assessment & Plan:  ? ?Cardiogenic shock 2/2 Complete Heart Block, suspect due to Propanol use in setting of renal dysfunction ~ RESOLVED ?Symptomatic Bradycardia ?Mildly elevated Troponin, suspect demand ischemia ?-Continuous cardiac monitoring ?-Maintain MAP >65 ?-Vasopressors as needed to maintain MAP goal ~ weaned off ?-Lactic acid has normalized ?-Trend HS Troponin until peaked (7 ~ 21 ) ?-Cardiology following, appreciate input ?-Cardiac Cath 10/24/21: normal coronary arteries, LVEF 45-50% with global hypokinesis ?-Temporary pacer removed 5/15 ?-Holding propranolol ? 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 ?Mood Disorder ?-Provide supportive care ?-Continue home carbamazepine, venlafaxine ? ?Bimalleolar ankle fracture with minimal displacement ?-Ortho following, appreciate input ?-Plan for non operative management at this time, follow up Ortho recs ? ?Suspected Cellulitis of RLE ?-Monitor fever curve ?-Trend WBC's  ?-Follow cultures as above ?-ABX deescalated to Unasyn from Vancomycin 5/15  ?-X-ray right knee 5/14 with no acute osseous abnormalities, soft tissues are unremarkable ? ? ? ? ? ?Best Practice (right click and "Reselect all SmartList Selections" daily)  ? ?Diet/type: regular diet ?DVT prophylaxis: LMWH ?GI prophylaxis: H2B ?Lines: N/A ?Foley: yes, consider removal 5/15 ?Code Status:  full code ?Last date of multidisciplinary goals of care discussion [5/15] ? ? ? ?Labs   ?CBC: ?Recent Labs  ?Lab 10/24/21 ?1320 10/24/21 ?1749 10/25/21 ?[id number] 10/26/21 ?0416  ?WBC 12.5* 16.4* 11.7* 9.1  ?HGB 15.1 13.7 13.2 13.3  ?HCT 47.3 40.8 39.1 40.2  ?MCV 92.4 87.4 87.9 88.5  ?PLT 271 276 251 215  ? ? ? ?Basic Metabolic Panel: ?Recent Labs  ?Lab 10/24/21 ?1320 10/24/21 ?1336 10/24/21 ?1749 10/25/21 ?0412 10/25/21 ?1343 10/26/21 ?0416  ?NA 132*  --  134* 134*  --  138  ?K 5.7*  --  4.2 3.3* 3.6 3.5  ?CL 95*  --  98 99  --  105  ?CO2 17*  --  23 23  --  19*  ?GLUCOSE 272*  --  160* 155*  --  95  ?BUN 15  --  19 19  --  11  ?CREATININE 1.43*  --  1.12 1.11  --  0.93  ?CALCIUM 8.9  --  8.2* 8.0*  --  8.4*  ?MG  --  3.0*  --  2.2  --  2.3  ?PHOS  --   --  5.0* 4.2  --  3.0  ? ? ?GFR: ?Estimated Creatinine Clearance: 120.9 mL/min (by C-G formula based on SCr of 0.93 mg/dL). ?Recent Labs  ?Lab 10/24/21 ?1303 10/24/21 ?1320 10/24/21 ?1749 10/25/21 ?[id number] 10/26/21 ?0416  ?WBC  --  12.5* 16.4* 11.7* 9.1  ?LATICACIDVEN 3.9*  --  1.3  --   --    ? ? ? ?Liver Function Tests: ?No results for input(s): AST, ALT, ALKPHOS, BILITOT, PROT, ALBUMIN in the last 168 hours. ?No results for input(s): LIPASE, AMYLASE in the last 168 hours. ?No results for input(s): AMMONIA in the last 168 hours. ? ?ABG ?   ?Component Value Date/Time  ? PHART 7.38 10/24/2021 2211  ? PCO2ART 40 10/24/2021 2211  ? PO2ART 96 10/24/2021 2211  ? HCO3 23.7 10/24/2021 2211  ? ACIDBASEDEF 1.3 10/24/2021 2211  ? O2SAT 98.8 10/24/2021 2211  ? ?  ? ?Coagulation Profile: ?Recent Labs  ?Lab 10/24/21 ?1336  ?INR 1.1  ? ? ? ?Cardiac Enzymes: ?No results for input(s): CKTOTAL, CKMB, CKMBINDEX, TROPONINI in the last 168 hours. ? ?HbA1C: ?Hgb A1c MFr Bld  ?Date/Time Value Ref Range Status  ?10/24/2021 05:49 PM 5.6 4.8 - 5.6 % Final  ?  Comment:  ?  (NOTE) ?Pre diabetes:          5.7%-6.4% ? ?Diabetes:              >6.4% ? ?Glycemic control for   <7.0% ?adults with diabetes ?  ? ? ?CBG: ?Recent Labs  ?Lab 10/25/21 ?1108 10/25/21 ?1556 10/25/21 ?1926 10/26/21 ?0218 10/26/21 ?  [place] 85 96 97 83 106*  ? ? ? ?Review of Systems:   ?Positives in BOLD: ?Gen: Denies fever, chills, weight change, fatigue, night sweats ?HEENT: Denies blurred vision, double vision, hearing loss, tinnitus, sinus congestion, rhinorrhea, sore throat, neck stiffness, dysphagia ?PULM: Denies shortness of breath, cough, sputum production, hemoptysis, wheezing ?CV: Denies chest pain, edema, orthopnea, paroxysmal nocturnal dyspnea, palpitations ?GI: Denies abdominal discomfort/gas, nausea, vomiting, diarrhea, hematochezia, melena, constipation, change in bowel habits ?GU: Denies dysuria, hematuria, polyuria, oliguria, urethral discharge ?Endocrine: Denies hot or cold intolerance, polyuria, polyphagia or appetite change ?Derm: Denies rash, dry skin, scaling or peeling skin change ?Heme: Denies easy bruising, bleeding, bleeding gums ?Neuro: Denies headache, numbness, weakness, slurred speech, loss of memory or consciousness ? ? ?Past  Medical History:  ?He,  has a past medical history of ADD (attention deficit disorder), Akathisia (01/18/2019), Allergy, Anemia, Anxiety, Barrett esophagus, Chest pain (08/07/2016), Colon polyps, Depression, Diverticulosis, Dysphagia, E

## 2021-10-26 NOTE — Evaluation (Signed)
Physical Therapy Evaluation ?Patient Details ?Name: Jared Carey ?MRN: [id number] ?DOB: 1956/06/03 ?Today's Date: 10/26/2021 ? ?History of Present Illness ? Pt is a 66 y.o. male with PMH that includes akathisia, anemia, anxiety, depression, dsyphagia, HTN, MDD, and tardive dyskinesia found unresponsive at his home, bradycardic.  External pacing initiated by EMS after EKG in the field showed complete heart block. Pacing was discontinued in ED after epinephrine gtt started, however, complete heart block recurred and he became hypoxic. Patient was subsequently intubated. Cardiac catheterization performed for coronary angiography (which showed normal coronaries) and temp perm pacemaker was placed and later removed on 10/26/21.  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 ?  ?Clinical Impression ? Pt was pleasant and motivated to participate during the session and put forth good effort throughout. Pt reported no vertigo, dizziness, or lightheadedness during the session and reported other adverse symptoms including pain. Pt required extra time, effort, and cuing for proper sequencing with functional tasks but did not require physical assistance.  Pt demonstrated good compliance with his weight bearing restrictions to his RLE during transfers and gait but was only able to move around 1-2 feet with a RW before becoming fatigued and needing to return to sitting. Pt is at a very high risk for falls and for RLE WB non-compliance if he were to return to his prior living situation at this time.  Pt will benefit from PT services in a SNF setting upon  discharge to safely address deficits listed in patient problem list for decreased caregiver assistance and eventual return to PLOF. ?   ?   ?   ? ?Recommendations for follow up therapy are one component of a multi-disciplinary discharge planning process, led by the attending physician.  Recommendations may be updated based on patient status, additional functional criteria and insurance authorization. ? ?Follow Up Recommendations Skilled nursing-short term rehab (<3 hours/day) ? ?  ?Assistance Recommended at Discharge Frequent or constant Supervision/Assistance  ?Patient can return home with the following ? Two people to help with walking and/or transfers;Two people to help with bathing/dressing/bathroom;A lot of help with bathing/dressing/bathroom;Assistance with cooking/housework;Help with stairs or ramp for entrance;Assist for transportation ? ?  ?Equipment Recommendations Other (comment) (TBD at next venue of care)  ?Recommendations for Other Services ?    ?  ?Functional Status Assessment Patient has had a recent decline in their functional status and demonstrates the ability to make significant improvements in function in a reasonable and predictable amount of time.  ? ?  ?Precautions / Restrictions Precautions ?Precautions: Fall ?Restrictions ?Weight Bearing Restrictions: Yes ?RLE Weight Bearing: Non weight bearing  ? ?  ? ?Mobility ? Bed Mobility ?Overal bed mobility: Modified Independent ?  ?  ?  ?  ?  ?  ?General bed mobility comments: Extra time and effort only ?  ? ?Transfers ?Overall transfer level: Needs assistance ?Equipment used: Rolling walker (2 wheels) ?Transfers: Sit to/from Stand ?Sit to Stand: From elevated surface, Min guard ?  ?  ?  ?  ?  ?General transfer comment: Mod verbal and visual cues for sequencing for RLE NWB compliance ?  ? ?Ambulation/Gait ?Ambulation/Gait assistance: Min guard ?Gait Distance (Feet): 1-2 Feet ?Assistive device: Rolling walker (2  wheels) ?  ?Gait velocity: decreased ?   ?  ?General Gait Details: Pt able to take several small hop-to steps/shuffles from bed to chair with mod verbal and visual cues for proper sequencing for WB compliance ? ?Stairs ?  ?  ?  ?  ?  ? ?Wheelchair Mobility ?  ? ?Modified Rankin (Stroke Patients Only) ?  ? ?  ? ?Balance Overall balance assessment: Needs assistance, History of Falls ?Sitting-balance support: Bilateral upper extremity supported ?Sitting balance-Leahy Scale: Good ?  ?  ?Standing balance support: During functional activity, Bilateral upper extremity supported, Reliant on assistive device for balance ?Standing balance-Leahy Scale: Fair ?  ?  ?  ?  ?  ?  ?  ?  ?  ?  ?  ?  ?   ? ? ? ?Pertinent Vitals/Pain Pain Assessment ?Pain Assessment: No/denies pain  ? ? ?Home Living Family/patient expects to be discharged to:: Private residence ?Living Arrangements: Spouse/significant other ?Available Help at Discharge: Family;Available PRN/intermittently ?Type of Home: Mobile home ?Home Access: Stairs to enter ?Entrance Stairs-Rails: Right;Left (can not reach both) ?Entrance Stairs-Number of Steps: 4 ?  ?Home Layout: One level ?Home Equipment: Shower seat;Cane - single point;Rollator (4 wheels) ?   ?  ?Prior Function Prior Level of Function : Independent/Modified Independent ?  ?  ?  ?  ?  ?  ?Mobility Comments: Mod Ind amb in the home with a SPC and limited community distances with a rollator; 4 falls in the last week related to syncopal episodes, extensive prior fall histroy seconary to dyskinesia per patient ?  ?  ? ? ?Hand Dominance  ?   ? ?  ?Extremity/Trunk Assessment  ? Upper Extremity Assessment ?Upper Extremity Assessment: Generalized weakness ?  ? ?Lower Extremity Assessment ?Lower Extremity Assessment: Generalized weakness ?  ? ?   ?Communication  ? Communication: No difficulties  ?Cognition Arousal/Alertness: Awake/alert ?Behavior During Therapy: [facility] for tasks assessed/performed ?Overall Cognitive Status: Within Functional Limits for tasks  assessed ?  ?  ?  ?  ?  ?  ?  ?  ?  ?  ?  ?  ?  ?  ?  ?  ?  ?  ?  ? ?  ?General Comments   ? ?  ?Exercises Other Exercises ?Other Exercises: Log roll review for bed mobility ?Other Exercises: Transfer training to/from multiple surfaces  ? ?Assessment/Plan  ?  ?PT Assessment Patient needs continued PT services  ?PT Problem List Decreased strength;Decreased activity tolerance;Decreased balance;Decreased mobility;Decreased knowledge of use of DME;Decreased knowledge of precautions ? ?   ?  ?PT Treatment Interventions DME instruction;Gait training;Stair training;Functional mobility training;Therapeutic activities;Therapeutic exercise;Balance training;Patient/family education   ? ?PT Goals (Current goals can be found in the Care Plan section)  ?Acute Rehab PT Goals ?Patient Stated Goal: To get up and move better ?PT Goal Formulation: With patient ?Time For Goal Achievement: 11/08/21 ?Potential to Achieve Goals: Good ? ?  ?Frequency 7X/week ?  ? ? ?Co-evaluation   ?  ?  ?  ?  ? ? ?  ?AM-PAC PT "6 Clicks" Mobility  ?Outcome Measure Help needed turning from your back to your side while in a flat bed without using bedrails?: A Little ?Help needed moving from lying on your back to sitting on the side of a flat bed without using bedrails?: A Little ?Help needed moving to and from a bed to a chair (including a wheelchair)?: A Little ?Help needed standing up from a chair using your  arms (e.g., wheelchair or bedside chair)?: A Little ?Help needed to walk in hospital room?: Total ?Help needed climbing 3-5 steps with a railing? : Total ?6 Click Score: 14 ? ?  ?End of Session Equipment Utilized During Treatment: Gait belt ?Activity Tolerance: Patient tolerated treatment well ?Patient left: in chair;with nursing/sitter in room;Other (comment) (Pt left with nursing for transport out of ICU) ?Nurse Communication: Mobility status ?PT Visit Diagnosis: History of falling (Z91.81);Unsteadiness on feet (R26.81);Difficulty in walking, not  elsewhere classified (R26.2);Muscle weakness (generalized) (M62.81) ?  ? ?Time: 7829-5621 ?PT Time Calculation (min) (ACUTE ONLY): 36 min ? ? ?Charges:   PT Evaluation ?$PT Eval Moderate Complexity: 1 Mod ?

## 2021-10-26 NOTE — Progress Notes (Signed)
[facility] Cardiology [facility] Encounter Note ? ?Patient: Jared Carey / Admit Date: 10/24/2021 / Date of Encounter: 10/26/2021, 9:00 AM ? ? ?Subjective: ?The patient is completely recovered from his events yesterday.  After further interview the patient appears to have had some episodes of syncopal concerns over the last 4 to 5 days.  When arriving to the emergency room the patient complete heart block with ventricular rate of 25.  The patient then had a temporary pacemaker wire for which she has done well.  At that time he did have some hyperkalemia and renal dysfunction with continued use of propranolol.  It is unclear whether this is the primary cause of his heart block.  There has been no evidence of coronary artery disease by cardiac catheterization and normal LV systolic function by echocardiogram with no evidence of valvular heart disease.  There is no other primary apparent cause of his heart block.  We have discussed at length with he and the family about the possibility of continued observation with his current evaluation until suggesting that he may be able to avoid pacemaker placement if the patient does not have any further event or heart block.  This would suggest that the propranolol was the primary cause. ? ?5/15 overall still doing well without new heart block since admission. Temp wire removed and patient still doing well . No new cardiac concerns ? ?Review of Systems: ?Positive for: None ?Negative for: Vision change, hearing change, syncope, dizziness, nausea, vomiting,diarrhea, bloody stool, stomach pain, cough, congestion, diaphoresis, urinary frequency, urinary pain,skin lesions, skin rashes ?Others previously listed ? ?Objective: ?Telemetry: Normal sinus rhythm ?Physical Exam: Blood pressure (!) 143/72, pulse 62, temperature 100 ?F (37.8 ?C), temperature source Bladder, resp. rate 18, height 6' (1.829 m), weight (!) 157.2 kg, SpO2 95 %. Body mass index is 47 kg/m?. ?General: Well  developed, well nourished, in no acute distress. ?Head: Normocephalic, atraumatic, sclera non-icteric, no xanthomas, nares are without discharge. ?Neck: No apparent masses ?Lungs: Normal respirations with no wheezes, no rhonchi, no rales , no crackles  ? Heart: Regular rate and rhythm, normal S1 S2, no murmur, no rub, no gallop, PMI is normal size and placement, carotid upstroke normal without bruit, jugular venous pressure normal ?Abdomen: Soft, non-tender, non-distended with normoactive bowel sounds. No hepatosplenomegaly. Abdominal aorta is normal size without bruit ?Extremities: No edema, no clubbing, no cyanosis, no ulcers,  ?Peripheral: 2+ radial, 2+ femoral, 2+ dorsal pedal pulses ?Neuro: Alert and oriented. Moves all extremities spontaneously. ?Psych:  Responds to questions appropriately with a normal affect. ? ? ?Intake/Output Summary (Last 24 hours) at 10/26/2021 0900 ?Last data filed at 10/25/2021 1900 ?Gross per 24 hour  ?Intake 10.32 ml  ?Output 1250 ml  ?Net -1239.68 ml  ? ? ? ?Inpatient Medications:  ? amLODipine  5 mg Oral Daily  ? aspirin  81 mg Oral Daily  ? carbamazepine  200 mg Oral TID  ? chlorhexidine gluconate (MEDLINE KIT)  15 mL Mouth Rinse BID  ? Chlorhexidine Gluconate Cloth  6 each Topical Q0600  ? clonazepam  0.25 mg Oral BID  ? enoxaparin (LOVENOX) injection  0.5 mg/kg Subcutaneous Q24H  ? insulin aspart  0-15 Units Subcutaneous TID WC  ? ipratropium-albuterol  3 mL Nebulization TID  ? potassium chloride  40 mEq Oral Once  ? traZODone  100 mg Oral QHS  ? venlafaxine XR  150 mg Oral Q breakfast  ? ?Infusions:  ? sodium chloride    ? vancomycin 1,250 mg (10/25/21 2108)  ? ? ?  Labs: ?Recent Labs  ?  10/25/21 ?0412 10/25/21 ?1343 10/26/21 ?0416  ?NA 134*  --  138  ?K 3.3* 3.6 3.5  ?CL 99  --  105  ?CO2 23  --  19*  ?GLUCOSE 155*  --  95  ?BUN 19  --  11  ?CREATININE 1.11  --  0.93  ?CALCIUM 8.0*  --  8.4*  ?MG 2.2  --  2.3  ?PHOS 4.2  --  3.0  ? ? ?No results for input(s): AST, ALT, ALKPHOS,  BILITOT, PROT, ALBUMIN in the last 72 hours. ?Recent Labs  ?  10/25/21 ?0412 10/26/21 ?0416  ?WBC 11.7* 9.1  ?HGB 13.2 13.3  ?HCT 39.1 40.2  ?MCV 87.9 88.5  ?PLT 251 215  ? ? ?No results for input(s): CKTOTAL, CKMB, TROPONINI in the last 72 hours. ?Invalid input(s): POCBNP ?Recent Labs  ?  10/24/21 ?3009  ?HGBA1C 5.6  ? ?  ? ?Weights: ?Filed Weights  ? 10/24/21 1305 10/24/21 1639  ?Weight: (!) 154.2 kg (!) 157.2 kg  ? ? ? ?Radiology/Studies:  ?DG Knee 1-2 Views Right ? ?Result Date: 10/25/2021 ?CLINICAL DATA:  Right knee pain EXAM: RIGHT KNEE - 1-2 VIEW COMPARISON:  None Available. FINDINGS: No evidence of fracture, dislocation, or joint effusion. No evidence of significant arthropathy or other focal bone abnormality. Soft tissues are unremarkable. IMPRESSION: No acute osseous abnormality identified. Electronically Signed   By: Ofilia Neas M.D.   On: 10/25/2021 16:30  ? ?CT CHEST WO CONTRAST ? ?Result Date: 10/24/2021 ?CLINICAL DATA:  Bradycardia, abnormal chest x-ray EXAM: CT CHEST WITHOUT CONTRAST TECHNIQUE: Multidetector CT imaging of the chest was performed following the standard protocol without IV contrast. RADIATION DOSE REDUCTION: This exam was performed according to the departmental dose-optimization program which includes automated exposure control, adjustment of the mA and/or kV according to patient size and/or use of iterative reconstruction technique. COMPARISON:  10/24/2021, 08/07/2016 FINDINGS: Cardiovascular: Unenhanced imaging of the heart and great vessels demonstrates no pericardial effusion. 4 cm ascending thoracic aortic aneurysm unchanged. Evaluation of the vascular lumen is limited without IV contrast. Cardiac pacer from inferior approach, lead terminates within the right ventricle. Mediastinum/Nodes: Patient is intubated, endotracheal tube terminating well above carina. Enteric catheter extends into the gastric lumen. 3.2 cm left lobe thyroid nodules unchanged since 2018. No pathologic  adenopathy within the mediastinum or hila. Lungs/Pleura: Dependent atelectasis seen bilaterally. No airspace disease, effusion, or pneumothorax. Minimal mucoid material within the mainstem bronchi. Upper Abdomen: No acute abnormality. Musculoskeletal: No acute or destructive bony lesions. Reconstructed images demonstrate no additional findings. IMPRESSION: 1. Support devices as above. 2. Dependent hypoventilatory changes bilaterally. No acute airspace disease. 3. 4 cm ascending thoracic aortic aneurysm, unchanged. Recommend annual imaging followup by CTA or MRA. This recommendation follows 2010 ACCF/AHA/AATS/ACR/ASA/SCA/SCAI/SIR/STS/SVM Guidelines for the Diagnosis and Management of Patients with Thoracic Aortic Disease. Circulation. 2010; 121: Q330-Q762. Aortic aneurysm NOS (ICD10-I71.9) Electronically Signed   By: Randa Ngo M.D.   On: 10/24/2021 19:27  ? ?CARDIAC CATHETERIZATION ? ?Result Date: 10/24/2021 ?  LV end diastolic pressure is severely elevated. Successful placement of Temporary Transvenous Pacemaker via 6 French RFV 23 cm sheath -> with loop and RA, of the catheter was advanced to 78-79 cm. => Rate set at 60 bpm, 5 MA with threshold of 1 MA.  Initially set at 70 bpm, reduced to 60. (Patient actually had a intrinsic rhythm upon completion of procedure) Angiographically Normal Coronary Arteries Relatively preserved LVEF (estimated 45 to 50%, with global hypokinesis)  EDP of 28-30 mmHg (40 mg IV Lasix administered) RECOMMENDATIONS Continue evaluation for nonischemic etiology for complete heart block EP consultation Continue to Manage Diastolic Heart Failure  ? ?DG Chest Port 1 View ? ?Result Date: 10/24/2021 ?CLINICAL DATA:  Bradycardia. EXAM: PORTABLE CHEST 1 VIEW COMPARISON:  Oct 24, 2021 (1:51 p.m.) FINDINGS: Multiple overlying cardiac lead wires are seen. There is stable endotracheal tube and nasogastric tube positioning. The heart size and mediastinal contours are within normal limits. Low lung  volumes are seen with mild areas of bibasilar scarring and/or atelectasis. Interval improvement of the previously noted pulmonary vascular congestion is seen. There is no evidence of a pleural effusion or pneumothor

## 2021-10-26 NOTE — Progress Notes (Signed)
?   10/26/21 1438  ?Assess: MEWS Score  ?Temp 100.1 ?F (37.8 ?C)  ?BP 92/61  ?Pulse Rate (!) 50  ?ECG Heart Rate (!) 50  ?Resp 20  ?Level of Consciousness Alert  ?SpO2 97 %  ?O2 Device Room Air  ?Assess: MEWS Score  ?MEWS Temp 0  ?MEWS Systolic 1  ?MEWS Pulse 1  ?MEWS RR 0  ?MEWS LOC 0  ?MEWS Score 2  ?MEWS Score Color Yellow  ?Assess: if the MEWS score is Yellow or Red  ?Were vital signs taken at a resting state? Yes  ?Focused Assessment No change from prior assessment  ?Does the patient meet 2 or more of the SIRS criteria? No  ?MEWS guidelines implemented *See Row Information* Yes  ?Treat  ?MEWS Interventions Other (Comment) ?(Metoprolol d/c - weaning med out of system)  ?Pain Scale 0-10  ?Pain Score 0  ?Take Vital Signs  ?Increase Vital Sign Frequency  Yellow: Q 2hr X 2 then Q 4hr X 2, if remains yellow, continue Q 4hrs  ?Escalate  ?MEWS: Escalate Yellow: discuss with charge nurse/RN and consider discussing with provider and RRT  ?Notify: Charge Nurse/RN  ?Name of Charge Nurse/RN Notified Shanda Bumps Christmas  ?Date Charge Nurse/RN Notified 10/26/21  ?Time Charge Nurse/RN Notified 1500  ?Document  ?Progress note created (see row info) Yes  ?Assess: SIRS CRITERIA  ?SIRS Temperature  0  ?SIRS Pulse 0  ?SIRS Respirations  0  ?SIRS WBC 0  ?SIRS Score Sum  0  ? ? ?

## 2021-10-26 NOTE — TOC Initial Note (Signed)
Transition of Care (TOC) - Initial/Assessment Note  ? ? ?Patient Details  ?Name: Jared Carey ?MRN: [id number] ?Date of Birth: 1955-12-31 ? ?Transition of Care (TOC) CM/SW Contact:    ?Allayne Butcher, RN ?Phone Number: ?10/26/2021, 1:00 PM ? ?Clinical Narrative:                 ?Patient is from home with wife, admitted with complete heart block now resolved.  Patient has an ankle fracture will await PT evaluation.   ? ?  ?  ? ? ?Patient Goals and CMS Choice ?  ?  ?  ? ?Expected Discharge Plan and Services ?  ?  ?  ?  ?  ?                ?  ?  ?  ?  ?  ?  ?  ?  ?  ?  ? ?Prior Living Arrangements/Services ?  ?  ?  ?       ?  ?  ?  ?  ? ?Activities of Daily Living ?  ?  ? ?Permission Sought/Granted ?  ?  ?   ?   ?   ?   ? ?Emotional Assessment ?  ?  ?  ?  ?  ?  ? ?Admission diagnosis:  Cardiogenic shock (HCC) [R57.0] ?Heart block [I45.9] ?Complete heart block (HCC) [I44.2] ?Symptomatic bradycardia [R00.1] ?Patient Active Problem List  ? Diagnosis Date Noted  ? Complete heart block (HCC) 10/24/2021  ? Symptomatic bradycardia 10/24/2021  ? AKI (acute kidney injury) (HCC)   ? Cardiogenic shock (HCC)   ? Gait abnormality 03/09/2021  ? Occipital neuralgia 03/09/2021  ? Akathisia 01/18/2019  ? Major depressive disorder with single episode, in partial remission (HCC) 12/12/2018  ? BMI 45.0-49.9, adult (HCC) 12/12/2018  ? Barrett's esophagus determined by endoscopy 02/28/2018  ? Multiple thyroid nodules 02/28/2018  ? OAB (overactive bladder) 02/28/2018  ? Tardive dyskinesia 02/28/2018  ? History of colon polyps 02/28/2018  ? Attention deficit disorder (ADD) 02/28/2018  ? Environmental and seasonal allergies 02/28/2018  ? Osteoarthritis of both thumbs 02/28/2018  ? Not currently working due to disabled status 02/28/2018  ? Degeneration, intervertebral disc, lumbar 02/28/2018  ? Constipation due to slow transit 02/28/2018  ? Hyperlipidemia 02/28/2018  ? GERD with esophagitis 08/07/2016  ? HTN (hypertension) 08/07/2016  ?  Depression 08/07/2016  ? ?PCP:  Nada Boozer, MD ?Pharmacy:   ?[id number] - [place], [place] - [address] ?[address] ?[place] [address] ?Phone: [phone] Fax: [phone] ? ? ? ? ?Social Determinants of Health (SDOH) Interventions ?  ? ?Readmission Risk Interventions ?   ? View : No data to display.  ?  ?  ?  ? ? ? ?

## 2021-10-27 ENCOUNTER — Inpatient Hospital Stay: Payer: Medicare Other

## 2021-10-27 DIAGNOSIS — I442 Atrioventricular block, complete: Secondary | ICD-10-CM

## 2021-10-27 LAB — CBC
HCT: 37.1 % — ABNORMAL LOW (ref 39.0–52.0)
Hemoglobin: 12.6 g/dL — ABNORMAL LOW (ref 13.0–17.0)
MCH: 30 pg (ref 26.0–34.0)
MCHC: 34 g/dL (ref 30.0–36.0)
MCV: 88.3 fL (ref 80.0–100.0)
Platelets: 207 10*3/uL (ref 150–400)
RBC: 4.2 MIL/uL — ABNORMAL LOW (ref 4.22–5.81)
RDW: 12.1 % (ref 11.5–15.5)
WBC: 6.6 10*3/uL (ref 4.0–10.5)
nRBC: 0 % (ref 0.0–0.2)

## 2021-10-27 LAB — PHOSPHORUS: Phosphorus: 3 mg/dL (ref 2.5–4.6)

## 2021-10-27 LAB — GLUCOSE, CAPILLARY
Glucose-Capillary: 118 mg/dL — ABNORMAL HIGH (ref 70–99)
Glucose-Capillary: 206 mg/dL — ABNORMAL HIGH (ref 70–99)
Glucose-Capillary: 92 mg/dL (ref 70–99)
Glucose-Capillary: 150 mg/dL — ABNORMAL HIGH (ref 70–99)

## 2021-10-27 LAB — LIPOPROTEIN A (LPA): Lipoprotein (a): 239.2 nmol/L — ABNORMAL HIGH (ref <75.0)

## 2021-10-27 LAB — BASIC METABOLIC PANEL
Anion gap: 8 (ref 5–15)
BUN: 12 mg/dL (ref 8–23)
CO2: 24 mmol/L (ref 22–32)
Calcium: 8.2 mg/dL — ABNORMAL LOW (ref 8.9–10.3)
Chloride: 105 mmol/L (ref 98–111)
Creatinine, Ser: 0.84 mg/dL (ref 0.61–1.24)
GFR, Estimated: >60 mL/min (ref >60)
Glucose, Bld: 120 mg/dL — ABNORMAL HIGH (ref 70–99)
Potassium: 3.2 mmol/L — ABNORMAL LOW (ref 3.5–5.1)
Sodium: 137 mmol/L (ref 135–145)

## 2021-10-27 LAB — MAGNESIUM: Magnesium: 2.1 mg/dL (ref 1.7–2.4)

## 2021-10-27 MED ORDER — POTASSIUM CHLORIDE CRYS ER 20 MEQ PO TBCR
40.0000 meq | EXTENDED_RELEASE_TABLET | Freq: Once | ORAL | Status: AC
  Administered 2021-10-27: 40 meq via ORAL
  Filled 2021-10-27: qty 2

## 2021-10-27 MED ORDER — POTASSIUM CHLORIDE 10 MEQ/100ML IV SOLN
10.0000 meq | INTRAVENOUS | Status: AC
  Administered 2021-10-27 (×4): 10 meq via INTRAVENOUS
  Filled 2021-10-27 (×4): qty 100

## 2021-10-27 MED ORDER — IPRATROPIUM-ALBUTEROL 0.5-2.5 (3) MG/3ML IN SOLN
3.0000 mL | Freq: Two times a day (BID) | RESPIRATORY_TRACT | Status: DC
  Administered 2021-10-27 – 2021-10-31 (×8): 3 mL via RESPIRATORY_TRACT
  Filled 2021-10-27 (×8): qty 3

## 2021-10-27 NOTE — Progress Notes (Signed)
Physical Therapy Treatment ?Patient Details ?Name: Jared Carey ?MRN: [id number] ?DOB: 1955/09/29 ?Today's Date: 10/27/2021 ? ? ?History of Present Illness Pt is a 66 y.o. male with PMH that includes akathisia, anemia, anxiety, depression, dsyphagia, HTN, MDD, and tardive dyskinesia found unresponsive at his home, bradycardic.  External pacing initiated by EMS after EKG in the field showed complete heart block. Pacing was discontinued in ED after epinephrine gtt started, however, complete heart block recurred and he became hypoxic. Patient was subsequently intubated. Cardiac catheterization performed for coronary angiography (which showed normal coronaries) and temp perm pacemaker was placed and later removed on 10/26/21.  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 ? ?  ?PT Comments  ? ? Pt was pleasant and motivated to participate during the session and put forth good effort throughout. Pt required extra time and effort with functional tasks but no physical assist.  Pt was steady with transfers and limited hop-to/shuffling steps with cues for sequencing for WB compliance.  Pt's SpO2 and HR were both WNL on room air during the session.  Pt will benefit from PT services in a SNF setting upon discharge to safely address deficits listed in patient problem list for decreased caregiver assistance and eventual return to PLOF. ? ?   ?Recommendations for follow up therapy are one component of a multi-disciplinary discharge planning process, led by the attending physician.  Recommendations may be updated based on patient status, additional  functional criteria and insurance authorization. ? ?Follow Up Recommendations ? Skilled nursing-short term rehab (<3 hours/day) ?  ?  ?Assistance Recommended at Discharge Frequent or constant Supervision/Assistance  ?Patient can return home with the following Two people to help with walking and/or transfers;Two people to help with bathing/dressing/bathroom;A lot of help with bathing/dressing/bathroom;Assistance with cooking/housework;Help with stairs or ramp for entrance;Assist for transportation ?  ?Equipment Recommendations ? Other (comment) (TBD at next venue of care)  ?  ?Recommendations for Other Services   ? ? ?  ?Precautions / Restrictions Precautions ?Precautions: Fall ?Restrictions ?Weight Bearing Restrictions: Yes ?RLE Weight Bearing: Non weight bearing  ?  ? ?Mobility ? Bed Mobility ?Overal bed mobility: Modified Independent ?  ?  ?  ?  ?  ?  ?General bed mobility comments: Extra time and effort only ?  ? ?Transfers ?Overall transfer level: Needs assistance ?Equipment used: Rolling walker (2 wheels) ?Transfers: Sit to/from Stand ?Sit to Stand: From elevated surface, Min guard ?  ?  ?  ?  ?  ?General transfer comment: Mod verbal and visual cues for sequencing for RLE NWB compliance ?  ? ?Ambulation/Gait ?Ambulation/Gait assistance: Min guard ?Gait Distance (Feet): 2 Feet ?Assistive device: Rolling walker (2 wheels) ?Gait Pattern/deviations: Shuffle ?Gait velocity: decreased ?  ?  ?General Gait Details: Pt able to take several small hop-to steps/shuffles from bed to chair with mod verbal and visual cues for proper sequencing for WB compliance ? ? ?Stairs ?  ?  ?  ?  ?  ? ? ?Wheelchair Mobility ?  ? ?Modified Rankin (Stroke Patients Only) ?  ? ? ?  ?Balance Overall balance assessment: Needs assistance, History of Falls ?Sitting-balance support: Bilateral upper extremity supported ?Sitting balance-Leahy Scale: Good ?  ?  ?Standing  balance support: During functional activity, Bilateral upper extremity  supported, Reliant on assistive device for balance ?Standing balance-Leahy Scale: Fair ?  ?  ?  ?  ?  ?  ?  ?  ?  ?  ?  ?  ?  ? ?  ?Cognition Arousal/Alertness: Awake/alert ?Behavior During Therapy: [facility] for tasks assessed/performed ?Overall Cognitive Status: Within Functional Limits for tasks assessed ?  ?  ?  ?  ?  ?  ?  ?  ?  ?  ?  ?  ?  ?  ?  ?  ?  ?  ?  ? ?  ?Exercises Total Joint Exercises ?Ankle Circles/Pumps: Strengthening, Left, 10 reps ?Quad Sets: Strengthening, Both, 10 reps ?Gluteal Sets: Strengthening, Both, 10 reps ?Hip ABduction/ADduction: Strengthening, AAROM, Both, 10 reps ?Straight Leg Raises: AAROM, Strengthening, Both, 10 reps ?Other Exercises ?Other Exercises: HEP education for BLE APs, QS, GS, and LAQs ? ?  ?General Comments   ?  ?  ? ?Pertinent Vitals/Pain Pain Assessment ?Pain Assessment: No/denies pain  ? ? ?Home Living   ?  ?  ?  ?  ?  ?  ?  ?  ?  ?   ?  ?Prior Function    ?  ?  ?   ? ?PT Goals (current goals can now be found in the care plan section) Progress towards PT goals: Progressing toward goals ? ?  ?Frequency ? ? ? 7X/week ? ? ? ?  ?PT Plan Current plan remains appropriate  ? ? ?Co-evaluation   ?  ?  ?  ?  ? ?  ?AM-PAC PT "6 Clicks" Mobility   ?Outcome Measure ? Help needed turning from your back to your side while in a flat bed without using bedrails?: A Little ?Help needed moving from lying on your back to sitting on the side of a flat bed without using bedrails?: A Little ?Help needed moving to and from a bed to a chair (including a wheelchair)?: A Little ?Help needed standing up from a chair using your arms (e.g., wheelchair or bedside chair)?: A Little ?Help needed to walk in hospital room?: Total ?Help needed climbing 3-5 steps with a railing? : Total ?6 Click Score: 14 ? ?  ?End of Session Equipment Utilized During Treatment: Gait belt ?Activity Tolerance: Patient tolerated treatment well ?Patient left: in chair;with chair alarm set;with call bell/phone within reach ?Nurse  Communication: Mobility status ?PT Visit Diagnosis: History of falling (Z91.81);Unsteadiness on feet (R26.81);Difficulty in walking, not elsewhere classified (R26.2);Muscle weakness (generalized) (M62.81) ?  ? ? ?Time: 9381-0175 ?PT Time Calculation (min) (ACUTE ONLY): 32 min ? ?Charges:  $Therapeutic Exercise: 8-22 mins ?$Therapeutic Activity: 8-22 mins          ?          ? ?D. Elly Modena PT, DPT ?10/27/21, 10:53 AM ? ? ?

## 2021-10-27 NOTE — Progress Notes (Signed)
?PROGRESS NOTE ? ? ? ?CY BRESEE  PTW:[id number] DOB: January 14, 1956 DOA: 10/24/2021 ?PCP: Nada Boozer, MD  ? ? ?Brief Narrative:  ?66 y.o. male found unresponsive at his home, bradycardic. External pacing initiated by EMS after EKG in the field showed complete heart block. Pacing was discontinued in ED after epinephrine gtt started, however, complete heart block recurred and he became hypoxic. Propofol, Versed initiated for sedation. Patient was subsequently intubated in the ED. Dr. Virl Son (Cardiology) was consulted and recommended cardiac catheterization for coronary angiography (which showed normal coronaries) and temp perm pacemaker placement. ?  ?Notable labs include WBC 12.5, Na 132, K 5.7, CO2 17, creatinine 1.43 (baseline ~1.0), lactate 3.9. Trop not elevated, BNP 27. Lactate 3.9. TSH wnl. UDS positive for BZ and THC. Initial VBG showed pH 7.22, pCO2 60. Subsequent ABG showed pH 7.25, pCO2 60, pO2 158. ?  ?CXR shows low lung volumes and pulmonary edema. Comment on mediastinal opacity which needs further evaluation on CT chest. Subsequent CT chest showed no mediastinal abnormality ? ?5/13: intubated, temp perm pacer placed, coronary angiography showed patent coronaries ?5/14: EXTUBATED. Ankle fx evaluated by Ortho, plan to treat nonoperatively for now.  Pressors weaned off. Right radial sheath removed with TR band placed. ?5/15: Temporary pacer wire removed. ABX deescalated from Vancomycin to Unasyn (cellulitis).  Consider Transfer to progressive cardiac unit. ?  ?5/16 TRH pickup ? ?Consultants:  ?Pccm, cardiology ? ?Procedures: temporary pacer ? ?Antimicrobials:  ?  ? ? ?Subjective: ?Has no sob, dizziness or cp. Working with PT ? ?Objective: ?Vitals:  ? 10/26/21 1942 10/26/21 2346 10/27/21 0416 10/27/21 0826  ?BP: (!) 165/63 (!) 141/65 (!) 136/58 (!) 142/58  ?Pulse: (!) 46 (!) 51 (!) 50 (!) 53  ?Resp: 20 18 20 18   ?Temp: 98 ?F (36.7 ?C) 98.3 ?F (36.8 ?C) 97.8 ?F (36.6 ?C) 98.1 ?F (36.7 ?C)  ?TempSrc:   Oral Oral   ?SpO2: 99% 97% 99% 100%  ?Weight:   (!) 153.3 kg   ?Height:      ? ? ?Intake/Output Summary (Last 24 hours) at 10/27/2021 0846 ?Last data filed at 10/27/2021 0750 ?Gross per 24 hour  ?Intake 1009.28 ml  ?Output 2375 ml  ?Net -1365.72 ml  ? ?Filed Weights  ? 10/24/21 1305 10/24/21 1639 10/27/21 0416  ?Weight: (!) 154.2 kg (!) 157.2 kg (!) 153.3 kg  ? ? ?Examination: ?Calm, NAD ?Cta no w/r ?Reg s1/s2 no gallop ?Soft benign +bs, mildly tender umbilical hernia ?+edema ?Aaoxox3  ?Mood and affect appropriate in current setting  ? ? ? ?Data Reviewed: I have personally reviewed following labs and imaging studies ? ?CBC: ?Recent Labs  ?Lab 10/24/21 ?1320 10/24/21 ?1749 10/25/21 ?10/27/21 10/26/21 ?10/28/21 10/27/21 ?0328  ?WBC 12.5* 16.4* 11.7* 9.1 6.6  ?HGB 15.1 13.7 13.2 13.3 12.6*  ?HCT 47.3 40.8 39.1 40.2 37.1*  ?MCV 92.4 87.4 87.9 88.5 88.3  ?PLT 271 276 251 215 207  ? ?Basic Metabolic Panel: ?Recent Labs  ?Lab 10/24/21 ?1320 10/24/21 ?1336 10/24/21 ?1749 10/25/21 ?0412 10/25/21 ?1343 10/26/21 ?0416 10/27/21 ?0328  ?NA 132*  --  134* 134*  --  138 137  ?K 5.7*  --  4.2 3.3* 3.6 3.5 3.2*  ?CL 95*  --  98 99  --  105 105  ?CO2 17*  --  23 23  --  19* 24  ?GLUCOSE 272*  --  160* 155*  --  95 120*  ?BUN 15  --  19 19  --  11 12  ?CREATININE  1.43*  --  1.12 1.11  --  0.93 0.84  ?CALCIUM 8.9  --  8.2* 8.0*  --  8.4* 8.2*  ?MG  --  3.0*  --  2.2  --  2.3 2.1  ?PHOS  --   --  5.0* 4.2  --  3.0 3.0  ? ?GFR: ?Estimated Creatinine Clearance: 132 mL/min (by C-G formula based on SCr of 0.84 mg/dL). ?Liver Function Tests: ?No results for input(s): AST, ALT, ALKPHOS, BILITOT, PROT, ALBUMIN in the last 168 hours. ?No results for input(s): LIPASE, AMYLASE in the last 168 hours. ?No results for input(s): AMMONIA in the last 168 hours. ?Coagulation Profile: ?Recent Labs  ?Lab 10/24/21 ?1336  ?INR 1.1  ? ?Cardiac Enzymes: ?No results for input(s): CKTOTAL, CKMB, CKMBINDEX, TROPONINI in the last 168 hours. ?BNP (last 3 results) ?No  results for input(s): PROBNP in the last 8760 hours. ?HbA1C: ?Recent Labs  ?  10/24/21 ?1749  ?HGBA1C 5.6  ? ?CBG: ?Recent Labs  ?Lab 10/26/21 ?0734 10/26/21 ?1124 10/26/21 ?1634 10/26/21 ?2033 10/27/21 ?[id number]  ?GLUCAP 106* 215* 154* 125* 118*  ? ?Lipid Profile: ?Recent Labs  ?  10/25/21 ?0412  ?TRIG 304*  ? ?Thyroid Function Tests: ?Recent Labs  ?  10/24/21 ?1336  ?TSH 3.066  ? ?Anemia Panel: ?No results for input(s): VITAMINB12, FOLATE, FERRITIN, TIBC, IRON, RETICCTPCT in the last 72 hours. ?Sepsis Labs: ?Recent Labs  ?Lab 10/24/21 ?1303 10/24/21 ?1749  ?LATICACIDVEN 3.9* 1.3  ? ? ?Recent Results (from the past 240 hour(s))  ?Resp Panel by RT-PCR (Flu A&B, Covid) Nasopharyngeal Swab     Status: None  ? Collection Time: 10/24/21  1:35 PM  ? Specimen: Nasopharyngeal Swab; Nasopharyngeal(NP) swabs in vial transport medium  ?Result Value Ref Range Status  ? SARS Coronavirus 2 by RT PCR NEGATIVE NEGATIVE Final  ?  Comment: (NOTE) ?SARS-CoV-2 target nucleic acids are NOT DETECTED. ? ?The SARS-CoV-2 RNA is generally detectable in upper respiratory ?specimens during the acute phase of infection. The lowest ?concentration of SARS-CoV-2 viral copies this assay can detect is ?138 copies/mL. A negative result does not preclude SARS-Cov-2 ?infection and should not be used as the sole basis for treatment or ?other patient management decisions. A negative result may occur with  ?improper specimen collection/handling, submission of specimen other ?than nasopharyngeal swab, presence of viral mutation(s) within the ?areas targeted by this assay, and inadequate number of viral ?copies(<138 copies/mL). A negative result must be combined with ?clinical observations, patient history, and epidemiological ?information. The expected result is Negative. ? ?Fact Sheet for Patients:  ?BloggerCourse.comhttps://www.fda.gov/media/152166/download ? ?Fact Sheet for Healthcare Providers:  ?SeriousBroker.ithttps://www.fda.gov/media/152162/download ? ?This test is no t yet approved  or cleared by the [place] FDA and  ?has been authorized for detection and/or diagnosis of SARS-CoV-2 by ?FDA under an Emergency Use Authorization (EUA). This EUA will remain  ?in effect (meaning this test can be used) for the duration of the ?COVID-19 declaration under Section 564(b)(1) of the Act, 21 ?U.S.C.section 360bbb-3(b)(1), unless the authorization is terminated  ?or revoked sooner.  ? ? ?  ? Influenza A by PCR NEGATIVE NEGATIVE Final  ? Influenza B by PCR NEGATIVE NEGATIVE Final  ?  Comment: (NOTE) ?The Xpert Xpress SARS-CoV-2/FLU/RSV plus assay is intended as an aid ?in the diagnosis of influenza from Nasopharyngeal swab specimens and ?should not be used as a sole basis for treatment. Nasal washings and ?aspirates are unacceptable for Xpert Xpress SARS-CoV-2/FLU/RSV ?testing. ? ?Fact Sheet for Patients: ?BloggerCourse.comhttps://www.fda.gov/media/152166/download ? ?Fact Sheet for  Healthcare Providers: ?SeriousBroker.it ? ?This test is not yet approved or cleared by the [place] FDA and ?has been authorized for detection and/or diagnosis of SARS-CoV-2 by ?FDA under an Emergency Use Authorization (EUA). This EUA will remain ?in effect (meaning this test can be used) for the duration of the ?COVID-19 declaration under Section 564(b)(1) of the Act, 21 U.S.C. ?section 360bbb-3(b)(1), unless the authorization is terminated or ?revoked. ? ?Performed at [facility], [address] [place], ?[place] [address] ?  ?MRSA Next Gen by PCR, Nasal     Status: None  ? Collection Time: 10/24/21  4:45 PM  ? Specimen: Nasal Mucosa; Nasal Swab  ?Result Value Ref Range Status  ? MRSA by PCR Next Gen NOT DETECTED NOT DETECTED Final  ?  Comment: (NOTE) ?The GeneXpert MRSA Assay (FDA approved for NASAL specimens only), ?is one component of a comprehensive MRSA colonization surveillance ?program. It is not intended to diagnose MRSA infection nor to guide ?or monitor treatment for MRSA  infections. ?Test performance is not FDA approved in patients less than 2 years ?old. ?Performed at [facility], [address] [place], ?[place] [address] ?  ?Blood culture (routine x 2)     Status: None (Prelim

## 2021-10-27 NOTE — Progress Notes (Signed)
Assumed care of pt at 1900. A&O x4. RA. C/o pain in right ankle overnight. Medication administration per MAR. Pt due to void at approx 2200. Bladderscan obtained at this time reveal >368 mL. Provider made aware overnight, see new orders. At approx 0330, pt voiding large amount C/Y/U in urinal. Full assessment per flowsheets. Call bell within reach, making needs known.  ?

## 2021-10-27 NOTE — Progress Notes (Signed)
PHARMACY CONSULT NOTE ? ?Pharmacy Consult for Electrolyte Monitoring and Replacement  ? ?Recent Labs: ?Potassium (mmol/L)  ?Date Value  ?10/27/2021 3.2 (L)  ? ?Magnesium (mg/dL)  ?Date Value  ?10/27/2021 2.1  ? ?Calcium (mg/dL)  ?Date Value  ?10/27/2021 8.2 (L)  ? ?Albumin (g/dL)  ?Date Value  ?09/13/2021 3.8  ? ?Phosphorus (mg/dL)  ?Date Value  ?10/27/2021 3.0  ? ?Sodium (mmol/L)  ?Date Value  ?10/27/2021 137  ? ? ?Assessment: ?66 y.o. male with past medical history of anxiety, anemia, ADD, akathisia, GERD, HTN, depression, obesity and venous insufficiency who presented via EMS from home after he was found unresponsive. Pharmacy consulted for electrolyte replacement.  ? ?Goal of Therapy:  ?Electrolytes WNL ? ?Plan:  ?K below goal today. Will replace with Kcl (already ordered by third shift provider) ? ?Jovan Colligan Rodriguez-Guzman PharmD, BCPS ?10/27/2021 10:28 AM ? ? ?

## 2021-10-27 NOTE — Progress Notes (Signed)
[facility] Cardiology [facility] Encounter Note ? ?Patient: Jared Carey / Admit Date: 10/24/2021 / Date of Encounter: 10/27/2021, 8:40 AM ? ? ?Subjective: ?The patient is completely recovered from his events yesterday.  After further interview the patient appears to have had some episodes of syncopal concerns over the last 4 to 5 days.  When arriving to the emergency room the patient complete heart block with ventricular rate of 25.  The patient then had a temporary pacemaker wire for which she has done well.  At that time he did have some hyperkalemia and renal dysfunction with continued use of propranolol.  It is unclear whether this is the primary cause of his heart block.  There has been no evidence of coronary artery disease by cardiac catheterization and normal LV systolic function by echocardiogram with no evidence of valvular heart disease.  There is no other primary apparent cause of his heart block.  We have discussed at length with he and the family about the possibility of continued observation with his current evaluation until suggesting that he may be able to avoid pacemaker placement if the patient does not have any further event or heart block.  This would suggest that the propranolol was the primary cause. ? ?5/15 overall still doing well without new heart block since admission. Temp wire removed and patient still doing well . No new cardiac concerns ? ?5/16.  Overall patient continues to improve with no evidence of concerns of heart block.  Heart rate shows sinus bradycardia at rest at 58 bpm but no evidence of advanced heart block since admission.  Patient had discontinuation of propranolol for that reason.  The patient does have significant fracture of his ankle which may need further intervention.  Currently he has had normal coronary arteries by cardiac catheterization normal LV systolic function and no evidence of further rhythm disturbances and therefore is at lowest risk possible for  orthopedic surgery. ? ?Review of Systems: ?Positive for: None ?Negative for: Vision change, hearing change, syncope, dizziness, nausea, vomiting,diarrhea, bloody stool, stomach pain, cough, congestion, diaphoresis, urinary frequency, urinary pain,skin lesions, skin rashes ?Others previously listed ? ?Objective: ?Telemetry: Normal sinus rhythm ?Physical Exam: Blood pressure (!) 142/58, pulse (!) 53, temperature 98.1 ?F (36.7 ?C), resp. rate 18, height 6' (1.829 m), weight (!) 153.3 kg, SpO2 100 %. Body mass index is 45.84 kg/m?. ?General: Well developed, well nourished, in no acute distress. ?Head: Normocephalic, atraumatic, sclera non-icteric, no xanthomas, nares are without discharge. ?Neck: No apparent masses ?Lungs: Normal respirations with no wheezes, no rhonchi, no rales , no crackles  ? Heart: Regular rate and rhythm, normal S1 S2, no murmur, no rub, no gallop, PMI is normal size and placement, carotid upstroke normal without bruit, jugular venous pressure normal ?Abdomen: Soft, non-tender, non-distended with normoactive bowel sounds. No hepatosplenomegaly. Abdominal aorta is normal size without bruit ?Extremities: No edema, no clubbing, no cyanosis, no ulcers,  ?Peripheral: 2+ radial, 2+ femoral, 2+ dorsal pedal pulses ?Neuro: Alert and oriented. Moves all extremities spontaneously. ?Psych:  Responds to questions appropriately with a normal affect. ? ? ?Intake/Output Summary (Last 24 hours) at 10/27/2021 0840 ?Last data filed at 10/27/2021 0750 ?Gross per 24 hour  ?Intake 1009.28 ml  ?Output 2375 ml  ?Net -1365.72 ml  ? ? ? ?Inpatient Medications:  ? amLODipine  5 mg Oral Daily  ? aspirin  81 mg Oral Daily  ? carbamazepine  200 mg Oral BID  ? chlorhexidine gluconate (MEDLINE KIT)  15 mL Mouth Rinse  BID  ? Chlorhexidine Gluconate Cloth  6 each Topical Q0600  ? clonazePAM  0.25 mg Oral BID  ? enoxaparin (LOVENOX) injection  0.5 mg/kg Subcutaneous Q24H  ? famotidine  20 mg Oral Daily  ? insulin aspart  0-15 Units  Subcutaneous TID WC  ? ipratropium-albuterol  3 mL Nebulization TID  ? tamsulosin  0.4 mg Oral Daily  ? traZODone  100 mg Oral QHS  ? venlafaxine XR  150 mg Oral Q breakfast  ? ?Infusions:  ? sodium chloride Stopped (10/26/21 1430)  ? ampicillin-sulbactam (UNASYN) IV 3 g (10/27/21 0552)  ? potassium chloride 10 mEq (10/27/21 0753)  ? ? ?Labs: ?Recent Labs  ?  10/26/21 ?0416 10/27/21 ?0328  ?NA 138 137  ?K 3.5 3.2*  ?CL 105 105  ?CO2 19* 24  ?GLUCOSE 95 120*  ?BUN 11 12  ?CREATININE 0.93 0.84  ?CALCIUM 8.4* 8.2*  ?MG 2.3 2.1  ?PHOS 3.0 3.0  ? ? ?No results for input(s): AST, ALT, ALKPHOS, BILITOT, PROT, ALBUMIN in the last 72 hours. ?Recent Labs  ?  10/26/21 ?0416 10/27/21 ?0328  ?WBC 9.1 6.6  ?HGB 13.3 12.6*  ?HCT 40.2 37.1*  ?MCV 88.5 88.3  ?PLT 215 207  ? ? ?No results for input(s): CKTOTAL, CKMB, TROPONINI in the last 72 hours. ?Invalid input(s): POCBNP ?Recent Labs  ?  10/24/21 ?6387  ?HGBA1C 5.6  ? ?  ? ?Weights: ?Filed Weights  ? 10/24/21 1305 10/24/21 1639 10/27/21 0416  ?Weight: (!) 154.2 kg (!) 157.2 kg (!) 153.3 kg  ? ? ? ?Radiology/Studies:  ?DG Knee 1-2 Views Right ? ?Result Date: 10/25/2021 ?CLINICAL DATA:  Right knee pain EXAM: RIGHT KNEE - 1-2 VIEW COMPARISON:  None Available. FINDINGS: No evidence of fracture, dislocation, or joint effusion. No evidence of significant arthropathy or other focal bone abnormality. Soft tissues are unremarkable. IMPRESSION: No acute osseous abnormality identified. Electronically Signed   By: Ofilia Neas M.D.   On: 10/25/2021 16:30  ? ?DG Ankle Complete Right ? ?Result Date: 10/27/2021 ?CLINICAL DATA:  Bimalleolar fracture. EXAM: RIGHT ANKLE - COMPLETE 3+ VIEW COMPARISON:  Oct 24, 2021. FINDINGS: The right ankle has been casted and immobilized. There is continued presence of mildly displaced medial malleolar and distal fibular fractures. IMPRESSION: Continued presence of mildly displaced medial malleolar and distal fibular fractures. Electronically Signed   By: Marijo Conception M.D.   On: 10/27/2021 08:35  ? ?CT CHEST WO CONTRAST ? ?Result Date: 10/24/2021 ?CLINICAL DATA:  Bradycardia, abnormal chest x-ray EXAM: CT CHEST WITHOUT CONTRAST TECHNIQUE: Multidetector CT imaging of the chest was performed following the standard protocol without IV contrast. RADIATION DOSE REDUCTION: This exam was performed according to the departmental dose-optimization program which includes automated exposure control, adjustment of the mA and/or kV according to patient size and/or use of iterative reconstruction technique. COMPARISON:  10/24/2021, 08/07/2016 FINDINGS: Cardiovascular: Unenhanced imaging of the heart and great vessels demonstrates no pericardial effusion. 4 cm ascending thoracic aortic aneurysm unchanged. Evaluation of the vascular lumen is limited without IV contrast. Cardiac pacer from inferior approach, lead terminates within the right ventricle. Mediastinum/Nodes: Patient is intubated, endotracheal tube terminating well above carina. Enteric catheter extends into the gastric lumen. 3.2 cm left lobe thyroid nodules unchanged since 2018. No pathologic adenopathy within the mediastinum or hila. Lungs/Pleura: Dependent atelectasis seen bilaterally. No airspace disease, effusion, or pneumothorax. Minimal mucoid material within the mainstem bronchi. Upper Abdomen: No acute abnormality. Musculoskeletal: No acute or destructive bony lesions. Reconstructed images demonstrate  no additional findings. IMPRESSION: 1. Support devices as above. 2. Dependent hypoventilatory changes bilaterally. No acute airspace disease. 3. 4 cm ascending thoracic aortic aneurysm, unchanged. Recommend annual imaging followup by CTA or MRA. This recommendation follows 2010 ACCF/AHA/AATS/ACR/ASA/SCA/SCAI/SIR/STS/SVM Guidelines for the Diagnosis and Management of Patients with Thoracic Aortic Disease. Circulation. 2010; 121: X588-T254. Aortic aneurysm NOS (ICD10-I71.9) Electronically Signed   By: Randa Ngo M.D.    On: 10/24/2021 19:27  ? ?CARDIAC CATHETERIZATION ? ?Result Date: 10/24/2021 ?  LV end diastolic pressure is severely elevated. Successful placement of Temporary Transvenous Pacemaker via 6 French RFV 23

## 2021-10-27 NOTE — Evaluation (Signed)
Occupational Therapy Evaluation ?Patient Details ?Name: Jared Carey ?MRN: [id number] ?DOB: 1955-12-10 ?Today's Date: 10/27/2021 ? ? ?History of Present Illness Pt is a 66 y.o. male with PMH that includes akathisia, anemia, anxiety, depression, dsyphagia, HTN, MDD, and tardive dyskinesia found unresponsive at his home, bradycardic.  External pacing initiated by EMS after EKG in the field showed complete heart block. Pacing was discontinued in ED after epinephrine gtt started, however, complete heart block recurred and he became hypoxic. Patient was subsequently intubated. Cardiac catheterization performed for coronary angiography (which showed normal coronaries) and temp perm pacemaker was placed and later removed on 10/26/21.  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  ? ?Clinical Impression ?  ?Patient presenting with decreased Ind in self care, balance, functional mobility/transfers, endurance, and safety awareness. Patient reports living with wife and is mod I with use of rollator or SPC as needed.  Patient currently functioning at min A for sit <>stand with RW. He does maintain precautions. OT anticipates UB self care with set up A to obtain needed items but mod - max A for LB self  care. Pt does endorse fatigue from just finishing working with PT to get into chair and do exercise. OT focused on chair push ups and education on anterior weight shift to increase strength for greater ease with functional transfers.  Patient will benefit from acute OT to increase overall independence in the areas of ADLs, functional mobility, and  safety awareness in order to safely discharge to next venue of care.  ?   ? ?Recommendations for follow up therapy are one component of a multi-disciplinary discharge planning process, led by the attending physician.  Recommendations may be updated based on patient status, additional functional criteria and insurance authorization.  ? ?Follow Up Recommendations ? Skilled nursing-short term rehab (<3 hours/day)  ?  ?Assistance Recommended at Discharge Intermittent Supervision/Assistance  ?Patient can return home with the following A lot of help with walking and/or transfers;A lot of help with bathing/dressing/bathroom;Help with stairs or ramp for entrance;Assist for transportation ? ?  ?Functional Status Assessment ? Patient has had a recent decline in their functional status and demonstrates the ability to make significant improvements in function in a reasonable and predictable amount of time.  ?Equipment Recommendations ? Other (comment) (defer to next venue of care)  ?  ?   ?Precautions / Restrictions Precautions ?Precautions: Fall ?Restrictions ?Weight Bearing Restrictions: Yes ?RLE Weight Bearing: Non weight bearing  ? ?  ? ?Mobility Bed Mobility ?  ?  ?  ?  ?  ?  ?  ?General bed mobility comments: seated in recliner chair ?  ? ? ? ?  ?Balance Overall balance assessment: Needs assistance, History of Falls ?Sitting-balance support: Bilateral upper extremity supported ?Sitting balance-Leahy Scale: Good ?  ?  ?  ?  ?  ?  ?  ?  ?  ?  ?  ?  ?  ?  ?  ?  ?   ? ?ADL either performed or assessed with clinical judgement  ? ?ADL Overall ADL's : Needs assistance/impaired ?  ?  ?Grooming: Wash/dry hands;Wash/dry face;Sitting;Set up ?  ?  ?  ?  ?  ?  ?  ?  ?  ?  ?  ?  ?  ?  ?  ?  ?   ? ? ? ?  Vision Patient Visual Report: No change from baseline ?   ?   ?   ?   ? ?Pertinent Vitals/Pain Pain Assessment ?Pain Assessment: 0-10 ?Pain Score: 5  ?Pain Location: R ankle ?Pain Descriptors / Indicators: Aching, Discomfort ?Pain  Intervention(s): Limited activity within patient's tolerance, Monitored during session, Repositioned, RN gave pain meds during session  ? ? ? ?Hand Dominance   ?  ?Extremity/Trunk Assessment Upper Extremity Assessment ?Upper Extremity Assessment: Generalized weakness ?  ?Lower Extremity Assessment ?Lower Extremity Assessment: Generalized weakness ?  ?  ?  ?Communication Communication ?Communication: No difficulties ?  ?Cognition Arousal/Alertness: Awake/alert ?Behavior During Therapy: [facility] for tasks assessed/performed ?Overall Cognitive Status: Within Functional Limits for tasks assessed ?  ?  ?  ?  ?  ?  ?  ?  ?  ?  ?  ?  ?  ?  ?  ?  ?  ?  ?  ?   ?   ?   ? ? ?Home Living Family/patient expects to be discharged to:: Private residence ?Living Arrangements: Spouse/significant other ?Available Help at Discharge: Family;Available PRN/intermittently ?Type of Home: Mobile home ?Home Access: Stairs to enter ?Entrance Stairs-Number of Steps: 4 ?Entrance Stairs-Rails: Right;Left ?Home Layout: One level ?  ?  ?Bathroom Shower/Tub: Walk-in shower ?  ?  ?  ?  ?Home Equipment: Shower seat;Cane - single point;Rollator (4 wheels) ?  ?  ?  ? ?  ?Prior Functioning/Environment Prior Level of Function : Independent/Modified Independent ?  ?  ?  ?  ?  ?  ?Mobility Comments: Mod Ind amb in the home with a SPC and limited community distances with a rollator; 4 falls in the last week related to syncopal episodes, extensive prior fall histroy seconary to dyskinesia per patient ?ADLs Comments: Pt reports mod I with ADLs and IADLs but is not very active outside of home. ?  ? ?  ?  ?OT Problem List: Decreased strength;Pain;Decreased activity tolerance;Decreased safety awareness;Impaired balance (sitting and/or standing);Decreased knowledge of precautions;Decreased knowledge of use of DME or AE ?  ?   ?OT Treatment/Interventions: Self-care/ADL training;Balance training;Therapeutic exercise;Energy conservation;Therapeutic activities;DME and/or  AE instruction;Visual/perceptual remediation/compensation;Patient/family education  ?  ?OT Goals(Current goals can be found in the care plan section) Acute Rehab OT Goals ?Patient Stated Goal: to be strong enough to go home ?OT Goal Formulation: With patient ?Time For Goal Achievement: 11/10/21 ?Potential to Achieve Goals: Good ?ADL Goals ?Pt Will Perform Grooming: with supervision;standing ?Pt Will Perform Lower Body Dressing: with min assist;sit to/from stand ?Pt Will Transfer to Toilet: with min guard assist;ambulating ?Pt Will Perform Toileting - Clothing Manipulation and hygiene: with min guard assist;sit to/from stand ?Pt/caregiver will Perform Home Exercise Program: Increased strength;Both right and left upper extremity;With theraband;With Supervision;With written HEP provided  ?OT Frequency: Min 2X/week ?  ? ?   ?AM-PAC OT "6 Clicks" Daily Activity     ?Outcome Measure Help from another person eating meals?: None ?Help from another person taking care of personal grooming?: None ?Help from another person toileting, which includes using toliet, bedpan, or urinal?: A Lot ?Help from another person bathing (including washing, rinsing, drying)?: A Lot ?Help from another person to put on and taking off regular upper body clothing?: None ?Help from another person to put on and taking off regular lower body clothing?: A Lot ?6 Click Score: 18 ?  ?End of Session Equipment Utilized During Treatment: Rolling walker (2 wheels) ?Nurse Communication: Mobility status;Patient requests pain meds ? ?Activity Tolerance: Patient  tolerated treatment well ?Patient left: in chair;with call bell/phone within reach;with chair alarm set ? ?OT Visit Diagnosis: Unsteadiness on feet (R26.81);Muscle weakness (generalized) (M62.81);Pain ?Pain - Right/Left: Right ?Pain - part of body: Ankle and joints of foot  ?              ?Time: [id number] ?OT Time Calculation (min): 22 min ?Charges:  OT General Charges ?$OT Visit: 1 Visit ?OT  Evaluation ?$OT Eval Moderate Complexity: 1 Mod ?OT Treatments ?$Therapeutic Exercise: 8-22 mins ? ?Darleen Crocker, MS, OTR/L , CBIS ?ascom [phone]  ?10/27/21, 11:15 AM  ?

## 2021-10-27 NOTE — Progress Notes (Signed)
Patient examined he still has quite a bit of swelling to the right ankle we will try to keep that elevated.  His ankle x-ray shows medial and lateral malleolus fracture with slight lateral displacement that will necessitate ORIF.  Right now his swelling is down and he is tentatively scheduled for this Thursday. ?

## 2021-10-28 DIAGNOSIS — I442 Atrioventricular block, complete: Secondary | ICD-10-CM

## 2021-10-28 DIAGNOSIS — J9622 Acute and chronic respiratory failure with hypercapnia: Secondary | ICD-10-CM

## 2021-10-28 DIAGNOSIS — J9621 Acute and chronic respiratory failure with hypoxia: Secondary | ICD-10-CM | POA: Diagnosis not present

## 2021-10-28 DIAGNOSIS — S82841A Displaced bimalleolar fracture of right lower leg, initial encounter for closed fracture: Secondary | ICD-10-CM

## 2021-10-28 LAB — CBC
HCT: 38.7 % — ABNORMAL LOW (ref 39.0–52.0)
Hemoglobin: 13 g/dL (ref 13.0–17.0)
MCH: 29.7 pg (ref 26.0–34.0)
MCHC: 33.6 g/dL (ref 30.0–36.0)
MCV: 88.4 fL (ref 80.0–100.0)
Platelets: 209 10*3/uL (ref 150–400)
RBC: 4.38 MIL/uL (ref 4.22–5.81)
RDW: 12.3 % (ref 11.5–15.5)
WBC: 5.6 10*3/uL (ref 4.0–10.5)
nRBC: 0 % (ref 0.0–0.2)

## 2021-10-28 LAB — MAGNESIUM: Magnesium: 2.2 mg/dL (ref 1.7–2.4)

## 2021-10-28 LAB — GLUCOSE, CAPILLARY
Glucose-Capillary: 98 mg/dL (ref 70–99)
Glucose-Capillary: 116 mg/dL — ABNORMAL HIGH (ref 70–99)
Glucose-Capillary: 123 mg/dL — ABNORMAL HIGH (ref 70–99)
Glucose-Capillary: 120 mg/dL — ABNORMAL HIGH (ref 70–99)

## 2021-10-28 LAB — PHOSPHORUS: Phosphorus: 3.4 mg/dL (ref 2.5–4.6)

## 2021-10-28 LAB — POTASSIUM: Potassium: 3.5 mmol/L (ref 3.5–5.1)

## 2021-10-28 MED ORDER — FLUTICASONE PROPIONATE 50 MCG/ACT NA SUSP
1.0000 | Freq: Every day | NASAL | Status: DC
  Administered 2021-10-28 – 2021-11-03 (×7): 1 via NASAL
  Filled 2021-10-28: qty 16

## 2021-10-28 NOTE — Progress Notes (Signed)
Occupational Therapy Treatment ?Patient Details ?Name: Jared Carey ?MRN: [id number] ?DOB: Nov 11, 1955 ?Today's Date: 10/28/2021 ? ? ?History of present illness Pt is a 66 y.o. male with PMH that includes akathisia, anemia, anxiety, depression, dsyphagia, HTN, MDD, and tardive dyskinesia found unresponsive at his home, bradycardic.  External pacing initiated by EMS after EKG in the field showed complete heart block. Pacing was discontinued in ED after epinephrine gtt started, however, complete heart block recurred and he became hypoxic. Patient was subsequently intubated. Cardiac catheterization performed for coronary angiography (which showed normal coronaries) and temp perm pacemaker was placed and later removed on 10/26/21.  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 ?  ?OT comments ? Pt seen for OT tx this date, on arrival, patient seated at the edge of the bed completing his breakfast.  Pt seen for LB dressing instruction with use of adaptive equipment, pt able to demonstrate understanding after verbal instruction and therapist demonstration.  Wife had taken clothing home so pt did not get to perform with personal clothing.  Pt seen for bed mobility and transfers to stand at the sink to perform grooming and UB bathing tasks.  Pt able to complete sit to stand with min assist this date to help weight shift forwards.  Once standing, therapist provided min guard during activity.  Pt able to stand for prolonged period of time to complete tasks while maintaining non weight bearing to right LE.  Continue to work towards goals in plan of  care to maximize safety and independence in necessary daily tasks.   ? ?Recommendations for follow up therapy are one component of a multi-disciplinary discharge planning process, led by the attending physician.  Recommendations may be updated based on patient status, additional functional criteria and insurance authorization. ?   ?Follow Up Recommendations ? Skilled nursing-short term rehab (<3 hours/day)  ?  ?Assistance Recommended at Discharge Intermittent Supervision/Assistance  ?Patient can return home with the following ? A lot of help with walking and/or transfers;A lot of help with bathing/dressing/bathroom;Help with stairs or ramp for entrance;Assist for transportation ?  ?Equipment Recommendations ?    ?  ?Recommendations for Other Services   ? ?  ?Precautions / Restrictions Precautions ?Precautions: Fall ?Restrictions ?Weight Bearing Restrictions: Yes ?RLE Weight Bearing: Non weight bearing  ? ? ?  ? ?Mobility Bed Mobility ?Overal bed mobility: Needs Assistance ?Bed Mobility: Supine to Sit, Sit to Supine ?  ?  ?  ?  ?  ?General bed mobility comments: min guiding with right LE to get back into the bed. ?  ? ?Transfers ?Overall transfer level: Needs assistance ?Equipment used: Rolling walker (2 wheels) ?Transfers: Sit to/from Stand ?Sit to Stand: From elevated surface, Min assist ?  ?  ?  ?  ?  ?General transfer comment: Several attempts to perform sit to stand, min assist to weight shift forward ?  ?  ?Balance Overall balance assessment: Needs assistance, History of Falls ?Sitting-balance support: Bilateral upper extremity supported ?Sitting balance-Leahy Scale: Good ?  ?  ?Standing balance support: During functional activity, Bilateral upper extremity supported, Reliant on assistive device for balance ?Standing balance-Leahy Scale: Fair ?  ?  ?  ?  ?  ?  ?  ?  ?  ?  ?  ?  ?   ? ?  ADL either performed or assessed with clinical judgement  ? ?ADL Overall ADL's : Needs assistance/impaired ?Eating/Feeding: Set  up;Sitting ?  ?Grooming: Standing ?Grooming Details (indicate cue type and reason): Pt seen this date for standing at the sink with use of walker to perform grooming tasks, NWB to RLE ?Upper Body Bathing: Standing;Min guard ?  ?  ?  ?  ?  ?Lower Body Dressing: Sitting/lateral leans ?Lower Body Dressing Details (indicate cue type and reason): Pt instructed on lower body dressing with use of reacher, wife had taken pts clothing home, therapist provided demo and verbal instruction regarding process. ?  ?  ?  ?  ?  ?  ?  ?General ADL Comments: Sit to supine and supine to sit with assist to manage right LE, min assist to stand from bed.  Standing at sink for prolonged period with NMB on right. ?  ? ?Extremity/Trunk Assessment Upper Extremity Assessment ?Upper Extremity Assessment: Generalized weakness ?  ?  ?  ?  ?  ? ?Vision   ?  ?  ?Perception   ?  ?Praxis   ?  ? ?Cognition Arousal/Alertness: Awake/alert ?Behavior During Therapy: [facility] for tasks assessed/performed ?Overall Cognitive Status: Within Functional Limits for tasks assessed ?  ?  ?  ?  ?  ?  ?  ?  ?  ?  ?  ?  ?  ?  ?  ?  ?  ?  ?  ?   ?Exercises   ? ?  ?Shoulder Instructions   ? ? ?  ?General Comments    ? ? ?Pertinent Vitals/ Pain       Pain Assessment ?Pain Assessment: 0-10 ?Pain Score: 3  ? ?Home Living   ?  ?  ?  ?  ?  ?  ?  ?  ?  ?  ?  ?  ?  ?  ?  ?  ?  ?  ? ?  ?Prior Functioning/Environment    ?  ?  ?  ?   ? ?Frequency ? Min 2X/week  ? ? ? ? ?  ?Progress Toward Goals ? ?OT Goals(current goals can now be found in the care plan section) ? Progress towards OT goals: Progressing toward goals ? ?Acute Rehab OT Goals ?Patient Stated Goal: to be independent as possible and go home ?OT Goal Formulation: With patient ?Time For Goal Achievement: 11/10/21 ?Potential to Achieve Goals: Good  ?Plan Discharge plan remains appropriate   ? ?Co-evaluation ? ? ?   ?  ?  ?  ?  ? ?  ?AM-PAC OT "6 Clicks" Daily Activity     ?Outcome Measure ? ? Help from another person eating  meals?: None ?Help from another person taking care of personal grooming?: None ?Help from another person toileting, which includes using toliet, bedpan, or urinal?: A Lot ?Help from another person bathing (including washing, rinsing, drying)?: A Lot ?Help from another person to put on and taking off regular upper body clothing?: A Little ?Help from another person to put on and taking off regular lower body clothing?: A Lot ?6 Click Score: 17 ? ?  ?End of Session Equipment Utilized During Treatment: Rolling walker (2 wheels) ? ?OT Visit Diagnosis: Unsteadiness on feet (R26.81);Muscle weakness (generalized) (M62.81);Pain ?Pain - Right/Left: Right ?Pain - part of body: Ankle and joints of foot ?  ?Activity Tolerance Patient tolerated treatment well ?  ?Patient Left in bed;with family/visitor present;with call bell/phone within reach ?  ?Nurse Communication   ?  ? ?   ? ?  Time: [id number] ?OT Time Calculation (min): 39 min ? ?Charges: OT General Charges ?$OT Visit: 1 Visit ?OT Treatments ?$Self Care/Home Management : 38-52 mins ?Cathrine Krizan Cornelius Moras Sakai Wolford, OTR/L, CLT ? ?10/28/2021, 10:51 AM ?

## 2021-10-28 NOTE — Progress Notes (Signed)
Physical Therapy Treatment ?Patient Details ?Name: Jared Carey ?MRN: [id number] ?DOB: 1955-06-23 ?Today's Date: 10/28/2021 ? ? ?History of Present Illness Pt is a 67 y.o. male with PMH that includes akathisia, anemia, anxiety, depression, dsyphagia, HTN, MDD, and tardive dyskinesia found unresponsive at his home, bradycardic.  External pacing initiated by EMS after EKG in the field showed complete heart block. Pacing was discontinued in ED after epinephrine gtt started, however, complete heart block recurred and he became hypoxic. Patient was subsequently intubated. Cardiac catheterization performed for coronary angiography (which showed normal coronaries) and temp perm pacemaker was placed and later removed on 10/26/21.  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 ? ?  ?PT Comments  ? ? Pt was pleasant and motivated to participate during the session and put forth good effort throughout. Pt HEP reviewed with pt reporting good compliance.  Pt steady with transfers from various height surfaces with extra time and effort to come to standing but without the need for physical assistance.  Pt able to take several hop-to steps/shuffles from bed to chair with good stability.  Pt will benefit from PT services in a SNF setting upon discharge to safely address deficits listed in patient problem list for decreased caregiver assistance and eventual return to PLOF. ? ?   ?Recommendations for follow up therapy are one component of a multi-disciplinary discharge planning process, led by the attending physician.  Recommendations may be updated based on  patient status, additional functional criteria and insurance authorization. ? ?Follow Up Recommendations ? Skilled nursing-short term rehab (<3 hours/day) ?  ?  ?Assistance Recommended at Discharge Frequent or constant Supervision/Assistance  ?Patient can return home with the following Two people to help with walking and/or transfers;Two people to help with bathing/dressing/bathroom;A lot of help with bathing/dressing/bathroom;Assistance with cooking/housework;Help with stairs or ramp for entrance;Assist for transportation ?  ?Equipment Recommendations ? Other (comment) (TBD at next venue of care)  ?  ?Recommendations for Other Services   ? ? ?  ?Precautions / Restrictions Precautions ?Precautions: Fall ?Restrictions ?Weight Bearing Restrictions: Yes ?RLE Weight Bearing: Non weight bearing  ?  ? ?Mobility ? Bed Mobility ?  ?  ?  ?  ?  ?  ?  ?General bed mobility comments: NT, pt sitting at EOB ?  ? ?Transfers ?Overall transfer level: Needs assistance ?Equipment used: Rolling walker (2 wheels) ?Transfers: Sit to/from Stand ?Sit to Stand: From elevated surface, Min guard ?  ?  ?  ?  ?  ?General transfer comment: Sit to/from stand training from multiple surfaces with pt able to stand with 2-3 rocking attempts but without physical assist ?  ? ?Ambulation/Gait ?Ambulation/Gait assistance: Min guard ?Gait Distance (Feet): 2 Feet ?Assistive device: Rolling walker (2 wheels) ?Gait Pattern/deviations: Shuffle ?Gait velocity: decreased ?  ?  ?General Gait Details: Pt able to take several small hop-to steps/shuffles from bed to chair with mod verbal and visual cues for proper sequencing for WB compliance ? ? ?Stairs ?  ?  ?  ?  ?  ? ? ?Wheelchair Mobility ?  ? ?Modified Rankin (Stroke Patients Only) ?  ? ? ?  ?Balance Overall balance assessment: Needs assistance, History of Falls ?Sitting-balance support: Bilateral upper extremity  supported ?Sitting balance-Leahy Scale: Good ?  ?  ?Standing balance support: During functional  activity, Bilateral upper extremity supported, Reliant on assistive device for balance ?Standing balance-Leahy Scale: Fair ?  ?  ?  ?  ?  ?  ?  ?  ?  ?  ?  ?  ?  ? ?  ?Cognition Arousal/Alertness: Awake/alert ?Behavior During Therapy: [facility] for tasks assessed/performed ?Overall Cognitive Status: Within Functional Limits for tasks assessed ?  ?  ?  ?  ?  ?  ?  ?  ?  ?  ?  ?  ?  ?  ?  ?  ?  ?  ?  ? ?  ?Exercises Total Joint Exercises ?Long Arc Quad: Strengthening, Both, 5 reps, 10 reps (with manual resistance) ?Knee Flexion: Strengthening, Both, 5 reps, 10 reps (with manual resistance) ?Other Exercises ?Other Exercises: HEP review for BLE APs, QS, GS, and LAQs ?Other Exercises: Transfer training to/from multiple surfaces ?Other Exercises: Positioning education for RLE elevation ? ?  ?General Comments   ?  ?  ? ?Pertinent Vitals/Pain Pain Assessment ?Pain Assessment: No/denies pain  ? ? ?Home Living   ?  ?  ?  ?  ?  ?  ?  ?  ?  ?   ?  ?Prior Function    ?  ?  ?   ? ?PT Goals (current goals can now be found in the care plan section) Progress towards PT goals: Progressing toward goals ? ?  ?Frequency ? ? ? 7X/week ? ? ? ?  ?PT Plan Current plan remains appropriate  ? ? ?Co-evaluation   ?  ?  ?  ?  ? ?  ?AM-PAC PT "6 Clicks" Mobility   ?Outcome Measure ? Help needed turning from your back to your side while in a flat bed without using bedrails?: A Little ?Help needed moving from lying on your back to sitting on the side of a flat bed without using bedrails?: A Little ?Help needed moving to and from a bed to a chair (including a wheelchair)?: A Little ?Help needed standing up from a chair using your arms (e.g., wheelchair or bedside chair)?: A Little ?Help needed to walk in hospital room?: Total ?Help needed climbing 3-5 steps with a railing? : Total ?6 Click Score: 14 ? ?  ?End of Session Equipment Utilized During Treatment: Gait belt ?Activity Tolerance: Patient tolerated treatment well ?Patient left: in chair;with chair  alarm set;with call bell/phone within reach;with family/visitor present ?Nurse Communication: Mobility status ?PT Visit Diagnosis: History of falling (Z91.81);Unsteadiness on feet (R26.81);Difficulty in walking, not elsewhere classified (R26.2);Muscle weakness (generalized) (M62.81) ?  ? ? ?Time: 6967-8938 ?PT Time Calculation (min) (ACUTE ONLY): 26 min ? ?Charges:  $Therapeutic Exercise: 8-22 mins ?$Therapeutic Activity: 8-22 mins          ?          ?D. Elly Modena PT, DPT ?10/28/21, 11:31 AM ? ? ?

## 2021-10-28 NOTE — Progress Notes (Signed)
?PROGRESS NOTE ? ? ? ?Jared Carey  [id number] DOB: Sep 19, 1955 DOA: 10/24/2021 ?PCP: Beacher May, MD  ? ?Assessment & Plan: ?  ?Principal Problem: ?  Complete heart block ([place]) ?Active Problems: ?  AKI (acute kidney injury) ([place]) ?  Cardiogenic shock ([place]) ?  Symptomatic bradycardia ? ?Assessment and Plan: ?Cardiogenic shock: secondary to complete heart block. Was previously taking propranolol. S/p cardiac cath that showed normal coronary arteries, EF 45-50% w/ global hypokinesis. Temporary pacer removed 10/26/21. Hold propranolol. No further cardiac inpatient work-up as per cardio. Cardio recs apprec  ? ?Acute hypoxic and hypercarbic respiratory failure: s/p intubation, ventilation & extubation. Continue on supplemental oxygen and wean as tolerated. Continue on bronchodilators. Rhinovirus was positive & possible COPD.   ?  ?Hypokalemia: WNL today  ?  ?Mood Disorder: unknown severity or type. Continue on home dose of venlafaxine & carbamazepine  ?  ?Right bimalleolar ankle fracture:  with minimal displacement. Surg tomorrow as per ortho surg  ?  ?Possible cellulitis of RLE: continue on IV unasyn.  ? ?Morbidly obese: BMI 46.2. Complicates overall care & prognosis ? ? ? ? ?DVT prophylaxis: lovenox  ?Code Status: full  ?Family Communication: discussed pt's care w/ pt's family at bedside and answered their questions ?Disposition Plan: possibly d/c to SNF. ? ?Level of care: Telemetry Cardiac ? ?Status is: Inpatient ?Remains inpatient appropriate because: severity of illness ? ? ? ?Consultants:  ?Cardio ?Ortho surg  ? ?Procedures:  ? ?Antimicrobials: unasyn  ? ?Subjective: ?Pt c/o ankle pain  ? ?Objective: ?Vitals:  ? 10/28/21 0508 10/28/21 0622 10/28/21 0811 10/28/21 1224  ?BP: (!) 152/84  (!) 149/86 (!) 142/73  ?Pulse: 93  92 88  ?Resp: 20  19 17   ?Temp: 97.6 ?F (36.4 ?C)  98.1 ?F (36.7 ?C) 98.1 ?F (36.7 ?C)  ?TempSrc: Oral     ?SpO2: 95%  97% 96%  ?Weight:  (!) 154.7 kg    ?Height:      ? ? ?Intake/Output  Summary (Last 24 hours) at 10/28/2021 1328 ?Last data filed at 10/28/2021 [id number] ?Gross per 24 hour  ?Intake 1420 ml  ?Output 2675 ml  ?Net -1255 ml  ? ?Filed Weights  ? 10/24/21 1639 10/27/21 0416 10/28/21 0622  ?Weight: (!) 157.2 kg (!) 153.3 kg (!) 154.7 kg  ? ? ?Examination: ? ?General exam: Appears calm and comfortable. Morbidly obese  ?Respiratory system: diminished breath sounds b/l  ?Cardiovascular system: S1 & S2 +. No rubs, gallops or clicks. No pedal edema. ?Gastrointestinal system: Abdomen is obese, soft and nontender. Normal bowel sounds heard. ?Central nervous system: Alert and oriented. Moves all extremities  ?Psychiatry: Judgement and insight appear normal. Appropriate mood and affect  ? ? ? ?Data Reviewed: I have personally reviewed following labs and imaging studies ? ?CBC: ?Recent Labs  ?Lab 10/24/21 ?[id number] 10/25/21 ?[id number] 10/26/21 ?[id number] 10/27/21 ?[id number] 10/28/21 ?[id number]  ?WBC 16.4* 11.7* 9.1 6.6 5.6  ?HGB 13.7 13.2 13.3 12.6* 13.0  ?HCT 40.8 39.1 40.2 37.1* 38.7*  ?MCV 87.4 87.9 88.5 88.3 88.4  ?PLT 276 251 215 207 209  ? ?Basic Metabolic Panel: ?Recent Labs  ?Lab 10/24/21 ?1320 10/24/21 ?1336 10/24/21 ?1749 10/25/21 ?[id number] 10/25/21 ?1343 10/26/21 ?[id number] 10/27/21 ?[id number] 10/28/21 ?[id number]  ?NA 132*  --  134* 134*  --  138 137  --   ?K 5.7*  --  4.2 3.3* 3.6 3.5 3.2* 3.5  ?CL 95*  --  98 99  --  105 105  --   ?CO2 17*  --  23 23  --  19* 24  --   ?GLUCOSE 272*  --  160* 155*  --  95 120*  --   ?BUN 15  --  19 19  --  11 12  --   ?CREATININE 1.43*  --  1.12 1.11  --  0.93 0.84  --   ?CALCIUM 8.9  --  8.2* 8.0*  --  8.4* 8.2*  --   ?MG  --  3.0*  --  2.2  --  2.3 2.1 2.2  ?PHOS  --   --  5.0* 4.2  --  3.0 3.0 3.4  ? ?GFR: ?Estimated Creatinine Clearance: 132.6 mL/min (by C-G formula based on SCr of 0.84 mg/dL). ?Liver Function Tests: ?No results for input(s): AST, ALT, ALKPHOS, BILITOT, PROT, ALBUMIN in the last 168 hours. ?No results for input(s): LIPASE, AMYLASE in the last 168 hours. ?No results for input(s):  AMMONIA in the last 168 hours. ?Coagulation Profile: ?Recent Labs  ?Lab 10/24/21 ?1336  ?INR 1.1  ? ?Cardiac Enzymes: ?No results for input(s): CKTOTAL, CKMB, CKMBINDEX, TROPONINI in the last 168 hours. ?BNP (last 3 results) ?No results for input(s): PROBNP in the last 8760 hours. ?HbA1C: ?No results for input(s): HGBA1C in the last 72 hours. ?CBG: ?Recent Labs  ?Lab 10/27/21 ?1244 10/27/21 ?1557 10/27/21 ?2006 10/28/21 ?[id number] 10/28/21 ?1222  ?GLUCAP 206* 92 150* 98 116*  ? ?Lipid Profile: ?No results for input(s): CHOL, HDL, LDLCALC, TRIG, CHOLHDL, LDLDIRECT in the last 72 hours. ?Thyroid Function Tests: ?No results for input(s): TSH, T4TOTAL, FREET4, T3FREE, THYROIDAB in the last 72 hours. ?Anemia Panel: ?No results for input(s): VITAMINB12, FOLATE, FERRITIN, TIBC, IRON, RETICCTPCT in the last 72 hours. ?Sepsis Labs: ?Recent Labs  ?Lab 10/24/21 ?1303 10/24/21 ?[id number]  ?LATICACIDVEN 3.9* 1.3  ? ? ?Recent Results (from the past 240 hour(s))  ?Resp Panel by RT-PCR (Flu A&B, Covid) Nasopharyngeal Swab     Status: None  ? Collection Time: 10/24/21  1:35 PM  ? Specimen: Nasopharyngeal Swab; Nasopharyngeal(NP) swabs in vial transport medium  ?Result Value Ref Range Status  ? SARS Coronavirus 2 by RT PCR NEGATIVE NEGATIVE Final  ?  Comment: (NOTE) ?SARS-CoV-2 target nucleic acids are NOT DETECTED. ? ?The SARS-CoV-2 RNA is generally detectable in upper respiratory ?specimens during the acute phase of infection. The lowest ?concentration of SARS-CoV-2 viral copies this assay can detect is ?138 copies/mL. A negative result does not preclude SARS-Cov-2 ?infection and should not be used as the sole basis for treatment or ?other patient management decisions. A negative result may occur with  ?improper specimen collection/handling, submission of specimen other ?than nasopharyngeal swab, presence of viral mutation(s) within the ?areas targeted by this assay, and inadequate number of viral ?copies(<138 copies/mL). A negative result  must be combined with ?clinical observations, patient history, and epidemiological ?information. The expected result is Negative. ? ?Fact Sheet for Patients:  ?EntrepreneurPulse.com.au ? ?Fact Sheet for Healthcare Providers:  ?IncredibleEmployment.be ? ?This test is no t yet approved or cleared by the [place] FDA and  ?has been authorized for detection and/or diagnosis of SARS-CoV-2 by ?FDA under an Emergency Use Authorization (EUA). This EUA will remain  ?in effect (meaning this test can be used) for the duration of the ?COVID-19 declaration under Section 564(b)(1) of the Act, 21 ?U.S.C.section 360bbb-3(b)(1), unless the authorization is terminated  ?or revoked sooner.  ? ? ?  ? Influenza A by PCR NEGATIVE NEGATIVE Final  ? Influenza B by PCR NEGATIVE NEGATIVE Final  ?  Comment: (NOTE) ?The Xpert Xpress SARS-CoV-2/FLU/RSV plus assay is intended as an aid ?in the diagnosis of influenza from Nasopharyngeal swab specimens and ?should not be used as a sole basis for treatment. Nasal washings and ?aspirates are unacceptable for Xpert Xpress SARS-CoV-2/FLU/RSV ?testing. ? ?Fact Sheet for Patients: ?EntrepreneurPulse.com.au ? ?Fact Sheet for Healthcare Providers: ?IncredibleEmployment.be ? ?This test is not yet approved or cleared by the [place] FDA and ?has been authorized for detection and/or diagnosis of SARS-CoV-2 by ?FDA under an Emergency Use Authorization (EUA). This EUA will remain ?in effect (meaning this test can be used) for the duration of the ?COVID-19 declaration under Section 564(b)(1) of the Act, 21 U.S.C. ?section 360bbb-3(b)(1), unless the authorization is terminated or ?revoked. ? ?Performed at [facility], [place], ?[place] [address] ?  ?MRSA Next Gen by PCR, Nasal     Status: None  ? Collection Time: 10/24/21  4:45 PM  ? Specimen: Nasal Mucosa; Nasal Swab  ?Result Value Ref Range Status  ? MRSA  by PCR Next Gen NOT DETECTED NOT DETECTED Final  ?  Comment: (NOTE) ?The GeneXpert MRSA Assay (FDA approved for NASAL specimens only), ?is one component of a comprehensive MRSA colonization surveillance ?progr

## 2021-10-28 NOTE — Progress Notes (Signed)
Discussed surgery with patient again today.  Swelling is down a little bit we will plan on ORIF tomorrow.  Orders placed.  Site marked. ?

## 2021-10-29 ENCOUNTER — Other Ambulatory Visit: Payer: Self-pay

## 2021-10-29 ENCOUNTER — Inpatient Hospital Stay: Payer: Medicare Other

## 2021-10-29 ENCOUNTER — Encounter
Admission: EM | Disposition: A | Discharge status: Skilled Nursing Facility | Payer: Self-pay | Source: Home / Self Care | Attending: Internal Medicine

## 2021-10-29 ENCOUNTER — Inpatient Hospital Stay: Payer: Medicare Other | Admitting: Anesthesiology

## 2021-10-29 DIAGNOSIS — S82841A Displaced bimalleolar fracture of right lower leg, initial encounter for closed fracture: Secondary | ICD-10-CM | POA: Diagnosis not present

## 2021-10-29 DIAGNOSIS — J9621 Acute and chronic respiratory failure with hypoxia: Secondary | ICD-10-CM | POA: Diagnosis not present

## 2021-10-29 DIAGNOSIS — J9622 Acute and chronic respiratory failure with hypercapnia: Secondary | ICD-10-CM | POA: Diagnosis not present

## 2021-10-29 DIAGNOSIS — I442 Atrioventricular block, complete: Secondary | ICD-10-CM | POA: Diagnosis not present

## 2021-10-29 HISTORY — PX: ORIF ANKLE FRACTURE: SHX5408

## 2021-10-29 LAB — CBC
HCT: 36.5 % — ABNORMAL LOW (ref 39.0–52.0)
Hemoglobin: 12.2 g/dL — ABNORMAL LOW (ref 13.0–17.0)
MCH: 29.6 pg (ref 26.0–34.0)
MCHC: 33.4 g/dL (ref 30.0–36.0)
MCV: 88.6 fL (ref 80.0–100.0)
Platelets: 235 10*3/uL (ref 150–400)
RBC: 4.12 MIL/uL — ABNORMAL LOW (ref 4.22–5.81)
RDW: 12.4 % (ref 11.5–15.5)
WBC: 5.1 10*3/uL (ref 4.0–10.5)
nRBC: 0 % (ref 0.0–0.2)

## 2021-10-29 LAB — CULTURE, BLOOD (ROUTINE X 2)
Culture: NO GROWTH
Culture: NO GROWTH

## 2021-10-29 LAB — BASIC METABOLIC PANEL
Anion gap: 8 (ref 5–15)
BUN: 10 mg/dL (ref 8–23)
CO2: 28 mmol/L (ref 22–32)
Calcium: 8.3 mg/dL — ABNORMAL LOW (ref 8.9–10.3)
Chloride: 103 mmol/L (ref 98–111)
Creatinine, Ser: 0.88 mg/dL (ref 0.61–1.24)
GFR, Estimated: >60 mL/min (ref >60)
Glucose, Bld: 118 mg/dL — ABNORMAL HIGH (ref 70–99)
Potassium: 3.5 mmol/L (ref 3.5–5.1)
Sodium: 139 mmol/L (ref 135–145)

## 2021-10-29 LAB — PHOSPHORUS: Phosphorus: 3.9 mg/dL (ref 2.5–4.6)

## 2021-10-29 LAB — GLUCOSE, CAPILLARY
Glucose-Capillary: 123 mg/dL — ABNORMAL HIGH (ref 70–99)
Glucose-Capillary: 116 mg/dL — ABNORMAL HIGH (ref 70–99)
Glucose-Capillary: 156 mg/dL — ABNORMAL HIGH (ref 70–99)
Glucose-Capillary: 119 mg/dL — ABNORMAL HIGH (ref 70–99)

## 2021-10-29 LAB — MAGNESIUM: Magnesium: 2.1 mg/dL (ref 1.7–2.4)

## 2021-10-29 SURGERY — OPEN REDUCTION INTERNAL FIXATION (ORIF) ANKLE FRACTURE
Anesthesia: General | Site: Ankle | Laterality: Right

## 2021-10-29 MED ORDER — GUAIFENESIN ER 600 MG PO TB12
600.0000 mg | ORAL_TABLET | Freq: Two times a day (BID) | ORAL | Status: DC
  Administered 2021-10-29 – 2021-11-03 (×11): 600 mg via ORAL
  Filled 2021-10-29 (×11): qty 1

## 2021-10-29 MED ORDER — BUPIVACAINE HCL (PF) 0.5 % IJ SOLN
INTRAMUSCULAR | Status: AC
  Filled 2021-10-29: qty 40

## 2021-10-29 MED ORDER — FENTANYL CITRATE (PF) 100 MCG/2ML IJ SOLN
INTRAMUSCULAR | Status: AC
  Filled 2021-10-29: qty 2

## 2021-10-29 MED ORDER — 0.9 % SODIUM CHLORIDE (POUR BTL) OPTIME
TOPICAL | Status: DC | PRN
  Administered 2021-10-29: 1000 mL

## 2021-10-29 MED ORDER — MIDAZOLAM HCL 2 MG/2ML IJ SOLN: 2.0000 mg | Freq: Once | INTRAMUSCULAR | Status: AC

## 2021-10-29 MED ORDER — DEXMEDETOMIDINE HCL IN NACL 200 MCG/50ML IV SOLN
INTRAVENOUS | Status: DC | PRN
  Administered 2021-10-29: 4 ug via INTRAVENOUS

## 2021-10-29 MED ORDER — DEXMEDETOMIDINE HCL IN NACL 80 MCG/20ML IV SOLN
INTRAVENOUS | Status: AC
  Filled 2021-10-29: qty 20

## 2021-10-29 MED ORDER — LIDOCAINE HCL (PF) 1 % IJ SOLN
INTRAMUSCULAR | Status: AC
  Filled 2021-10-29: qty 5

## 2021-10-29 MED ORDER — LIDOCAINE HCL (PF) 1 % IJ SOLN
INTRAMUSCULAR | Status: DC | PRN
  Administered 2021-10-29 (×2): 1 mL via SUBCUTANEOUS

## 2021-10-29 MED ORDER — MIDAZOLAM HCL 2 MG/2ML IJ SOLN
INTRAMUSCULAR | Status: AC
  Administered 2021-10-29: 1 mg via INTRAVENOUS
  Filled 2021-10-29: qty 2

## 2021-10-29 MED ORDER — PROPOFOL 10 MG/ML IV BOLUS
INTRAVENOUS | Status: DC | PRN
  Administered 2021-10-29: 30 mg via INTRAVENOUS

## 2021-10-29 MED ORDER — PHENYLEPHRINE HCL (PRESSORS) 10 MG/ML IV SOLN
INTRAVENOUS | Status: DC | PRN
  Administered 2021-10-29: 80 ug via INTRAVENOUS
  Administered 2021-10-29 (×2): 160 ug via INTRAVENOUS

## 2021-10-29 MED ORDER — BUPIVACAINE HCL (PF) 0.5 % IJ SOLN
INTRAMUSCULAR | Status: DC | PRN
  Administered 2021-10-29 (×2): 20 mL

## 2021-10-29 MED ORDER — EPHEDRINE 5 MG/ML INJ
INTRAVENOUS | Status: AC
  Filled 2021-10-29: qty 5

## 2021-10-29 MED ORDER — EPHEDRINE SULFATE (PRESSORS) 50 MG/ML IJ SOLN
INTRAMUSCULAR | Status: DC | PRN
  Administered 2021-10-29 (×6): 10 mg via INTRAVENOUS
  Administered 2021-10-29: 5 mg via INTRAVENOUS
  Administered 2021-10-29: 15 mg via INTRAVENOUS

## 2021-10-29 MED ORDER — PROPOFOL 500 MG/50ML IV EMUL
INTRAVENOUS | Status: DC | PRN
  Administered 2021-10-29: 100 ug/kg/min via INTRAVENOUS

## 2021-10-29 MED ORDER — FENTANYL CITRATE (PF) 100 MCG/2ML IJ SOLN
INTRAMUSCULAR | Status: DC | PRN
  Administered 2021-10-29: 25 ug via INTRAVENOUS

## 2021-10-29 MED ORDER — EPINEPHRINE 1 MG/10ML IJ SOSY
PREFILLED_SYRINGE | INTRAMUSCULAR | Status: DC | PRN
  Administered 2021-10-29 (×2): 10 ug via INTRAVENOUS
  Administered 2021-10-29: 20 ug via INTRAVENOUS

## 2021-10-29 MED ORDER — EPINEPHRINE PF 1 MG/ML IJ SOLN
INTRAMUSCULAR | Status: AC
  Filled 2021-10-29: qty 1

## 2021-10-29 MED ORDER — PROPOFOL 1000 MG/100ML IV EMUL
INTRAVENOUS | Status: AC
  Filled 2021-10-29: qty 100

## 2021-10-29 MED ORDER — SODIUM CHLORIDE (PF) 0.9 % IJ SOLN
INTRAMUSCULAR | Status: AC
  Filled 2021-10-29: qty 10

## 2021-10-29 SURGICAL SUPPLY — 60 items
BIT DRILL 2.5X2.75 QC CALB (BIT) ×1 IMPLANT
BIT DRILL 2.9 CANN QC NONSTRL (BIT) ×1 IMPLANT
BIT DRILL 3.5X5.5 QC CALB (BIT) ×1 IMPLANT
BIT DRILL CALIBRATED 2.7 (BIT) ×1 IMPLANT
BNDG ELASTIC 4X5.8 VLCR STR LF (GAUZE/BANDAGES/DRESSINGS) ×4 IMPLANT
CHLORAPREP W/TINT 26 (MISCELLANEOUS) ×2 IMPLANT
CUFF TOURN SGL QUICK 24 (TOURNIQUET CUFF)
CUFF TOURN SGL QUICK 34 (TOURNIQUET CUFF)
CUFF TRNQT CYL 24X4X16.5-23 (TOURNIQUET CUFF) IMPLANT
CUFF TRNQT CYL 34X4.125X (TOURNIQUET CUFF) IMPLANT
DRAPE C-ARM 42X70 (DRAPES) ×1 IMPLANT
DRAPE C-ARMOR (DRAPES) ×1 IMPLANT
DRAPE FLUOR MINI C-ARM 54X84 (DRAPES) ×1 IMPLANT
DRAPE INCISE IOBAN 66X45 STRL (DRAPES) ×2 IMPLANT
DRAPE U-SHAPE 47X51 STRL (DRAPES) ×2 IMPLANT
DRSG EMULSION OIL 3X8 NADH (GAUZE/BANDAGES/DRESSINGS) ×1 IMPLANT
ELECT CAUTERY BLADE 6.4 (BLADE) ×2 IMPLANT
ELECT REM PT RETURN 9FT ADLT (ELECTROSURGICAL) ×2
ELECTRODE REM PT RTRN 9FT ADLT (ELECTROSURGICAL) ×1 IMPLANT
GAUZE SPONGE 4X4 12PLY STRL (GAUZE/BANDAGES/DRESSINGS) ×2 IMPLANT
GAUZE XEROFORM 1X8 LF (GAUZE/BANDAGES/DRESSINGS) ×2 IMPLANT
GLOVE SURG SYN 9.0  PF PI (GLOVE) ×2
GLOVE SURG SYN 9.0 PF PI (GLOVE) ×1 IMPLANT
GOWN SRG 2XL LVL 4 RGLN SLV (GOWNS) ×1 IMPLANT
GOWN STRL NON-REIN 2XL LVL4 (GOWNS) ×2
GOWN STRL REUS W/ TWL LRG LVL3 (GOWN DISPOSABLE) ×1 IMPLANT
GOWN STRL REUS W/TWL LRG LVL3 (GOWN DISPOSABLE) ×2
HEMOVAC 400ML (MISCELLANEOUS)
K-WIRE ACE 1.6X6 (WIRE) ×4
KIT DRAIN HEMOVAC JP 7FR 400ML (MISCELLANEOUS) IMPLANT
KIT TURNOVER KIT A (KITS) ×2 IMPLANT
KWIRE ACE 1.6X6 (WIRE) IMPLANT
LABEL OR SOLS (LABEL) ×1 IMPLANT
MANIFOLD NEPTUNE II (INSTRUMENTS) ×2 IMPLANT
NS IRRIG 1000ML POUR BTL (IV SOLUTION) ×2 IMPLANT
PACK EXTREMITY ARMC (MISCELLANEOUS) ×2 IMPLANT
PAD ABD DERMACEA PRESS 5X9 (GAUZE/BANDAGES/DRESSINGS) ×4 IMPLANT
PAD CAST CTTN 4X4 STRL (SOFTGOODS) ×2 IMPLANT
PAD PREP 24X41 OB/GYN DISP (PERSONAL CARE ITEMS) ×2 IMPLANT
PADDING CAST COTTON 4X4 STRL (SOFTGOODS) ×4
PLATE LOCK 6H 77 BILAT FIB (Plate) ×1 IMPLANT
SCALPEL PROTECTED #15 DISP (BLADE) ×4 IMPLANT
SCREW ACE CAN 4.0 34M (Screw) ×1 IMPLANT
SCREW LOCK CORT STAR 3.5X10 (Screw) ×1 IMPLANT
SCREW LOCK CORT STAR 3.5X12 (Screw) ×1 IMPLANT
SCREW LP 3.5X60MM (Screw) ×2 IMPLANT
SCREW NON LOCKING LP 3.5 14MM (Screw) ×2 IMPLANT
SPLINT CAST 1 STEP 3X12 (MISCELLANEOUS) ×2 IMPLANT
SPONGE T-LAP 18X18 ~~LOC~~+RFID (SPONGE) ×1 IMPLANT
STAPLER SKIN PROX 35W (STAPLE) ×2 IMPLANT
SUT ETHILON 3-0 FS-10 30 BLK (SUTURE) ×2
SUT MNCRL AB 4-0 PS2 18 (SUTURE) ×4 IMPLANT
SUT VIC AB 0 CT1 36 (SUTURE) ×2 IMPLANT
SUT VIC AB 2-0 SH 27 (SUTURE) ×4
SUT VIC AB 2-0 SH 27XBRD (SUTURE) ×2 IMPLANT
SUT VIC AB 3-0 SH 27 (SUTURE) ×2
SUT VIC AB 3-0 SH 27X BRD (SUTURE) ×1 IMPLANT
SUTURE EHLN 3-0 FS-10 30 BLK (SUTURE) ×1 IMPLANT
SYR 10ML LL (SYRINGE) ×2 IMPLANT
WATER STERILE IRR 500ML POUR (IV SOLUTION) ×2 IMPLANT

## 2021-10-29 NOTE — Plan of Care (Signed)
  Problem: Education: Goal: Knowledge of General Education information will improve Description: Including pain rating scale, medication(s)/side effects and non-pharmacologic comfort measures Outcome: Progressing   Problem: Health Behavior/Discharge Planning: Goal: Ability to manage health-related needs will improve Outcome: Progressing   Problem: Clinical Measurements: Goal: Ability to maintain clinical measurements within normal limits will improve Outcome: Progressing Goal: Will remain free from infection Outcome: Progressing Goal: Diagnostic test results will improve Outcome: Progressing Goal: Respiratory complications will improve Outcome: Not Applicable Goal: Cardiovascular complication will be avoided Outcome: Progressing   Problem: Activity: Goal: Risk for activity intolerance will decrease Outcome: Progressing   Problem: Nutrition: Goal: Adequate nutrition will be maintained Outcome: Progressing

## 2021-10-29 NOTE — Op Note (Signed)
10/29/2021  2:52 PM  PATIENT:  Jared Carey  66 y.o. male  PRE-OPERATIVE DIAGNOSIS:  Right Ankle Fracture  POST-OPERATIVE DIAGNOSIS:  right ankle bimalleolar fracture  PROCEDURE:  Procedure(s): OPEN REDUCTION INTERNAL FIXATION (ORIF) ANKLE FRACTURE (Right)  SURGEON: Laurene Footman, MD  ASSISTANTS: None  ANESTHESIA:   regional and general  EBL:  Total I/O In: 900 [I.V.:800; IV Piggyback:100] Out: 975 [Urine:975]  BLOOD ADMINISTERED:none  DRAINS: none   LOCAL MEDICATIONS USED:  NONE  SPECIMEN:.  None  DISPOSITION OF SPECIMEN:  N/A  COUNTS:  YES  TOURNIQUET:   Total Tourniquet Time Documented: Calf (Right) - 61 minutes Total: Calf (Right) - 61 minutes   IMPLANTS: Biomet composite plate with 2 locking and 3 nonlocking screws as well as one 4.0 cannulated screw  DICTATION: .Dragon Dictation patient was brought to the operating room and a block was given initially with a tourniquet applied to the upper calf that was ineffective and subsequently went to an LMA.  After prepping and draping in usual sterile fashion appropriate patient identification timeout procedures were completed.  C-arm was brought in and localization of the tip of the fibula was identified and a distal incision made subcutaneous tissue spread and a plate that had been precontoured was slid subcutaneously proximal incision was then made to get the plate in line with the fibula with the fracture in a reduced position.  2 proximal nonlocking screws were placed that brought the plate down to the shaft and with the contour that helped reduce the fibula to anatomic position.  The 2 locking screws were then placed in the distal fragment and with a 1 screw above this a screw placed across the syndesmosis was placed to give additional rigidity to the construct not really for the syndesmosis was there was appeared fairly stable this gave very good anatomic alignment of the fibula.  Going medially an anterior medial  approach was made with the fracture in that orientation fracture site was exposed and the fracture held in a reduced position with placement of a same guidewire then drilling over this after measuring and then placing the 36 mm cannulated screw permanent C arm views were then obtained the wounds were irrigated and wounds closed with 3-0 Vicryl subcutaneously and skin staples Xeroform 4 x 4 ABD web roll stirrup splint and Ace wrap applied tourniquet let down prior to application of the splint.  PLAN OF CARE: Admit to inpatient   PATIENT DISPOSITION:  PACU - hemodynamically stable.

## 2021-10-29 NOTE — Anesthesia Procedure Notes (Addendum)
Anesthesia Regional Block: Popliteal block   Pre-Anesthetic Checklist: , timeout performed,  Correct Patient, Correct Site, Correct Laterality,  Correct Procedure, Correct Position, site marked,  Risks and benefits discussed,  Surgical consent,  Pre-op evaluation,  At surgeon's request and post-op pain management  Laterality: Right  Prep: chloraprep       Needles:  Injection technique: Single-shot  Needle Type: Stimiplex     Needle Length: 9cm  Needle Gauge: 22     Additional Needles:   Procedures:,,,, ultrasound used (permanent image in chart),,    Narrative:  Start time: 10/29/2021 12:20 PM End time: 10/29/2021 12:23 PM Injection made incrementally with aspirations every 20 mL.  Performed by: Personally  Anesthesiologist: Foye Deer, MD  Additional Notes: Patient consented for risk and benefits of nerve block including but not limited to nerve damage, failed block, bleeding and infection.  Patient voiced understanding.  Functioning IV was confirmed and monitors were applied.  Timeout done prior to procedure and prior to any sedation being given to the patient.  Patient confirmed procedure site prior to any sedation given to the patient.  A 29mm 22ga Stimuplex needle was used. Sterile prep,hand hygiene and sterile gloves were used.  Minimal sedation used for procedure.  No paresthesia endorsed by patient during the procedure.  Negative aspiration and negative test dose prior to incremental administration of local anesthetic. The patient tolerated the procedure well with no immediate complications.

## 2021-10-29 NOTE — Anesthesia Procedure Notes (Signed)
Procedure Name: LMA Insertion Date/Time: 10/29/2021 1:51 PM Performed by: Reece Agar, CRNA Pre-anesthesia Checklist: Patient identified, Emergency Drugs available, Suction available and Patient being monitored Patient Re-evaluated:Patient Re-evaluated prior to induction Oxygen Delivery Method: Circle system utilized Preoxygenation: Pre-oxygenation with 100% oxygen Induction Type: IV induction Ventilation: Mask ventilation without difficulty LMA: LMA inserted LMA Size: 5.0 Number of attempts: 1 Placement Confirmation: positive ETCO2 and breath sounds checked- equal and bilateral Tube secured with: Tape Dental Injury: Teeth and Oropharynx as per pre-operative assessment

## 2021-10-29 NOTE — Progress Notes (Signed)
PT Cancellation Note  Patient Details Name: Jared Carey MRN: [id number] DOB: 09-Feb-1956   Cancelled Treatment:    Reason Eval/Treat Not Completed: Medical issues which prohibited therapy Due to patient receiving ankle surgery later today, will hold on physical therapy at this time. Per hospital guidelines, patient will require new physical therapy orders following surgery. Thank you!   Angelica Ran, PT  10/29/21. 11:19 AM

## 2021-10-29 NOTE — Plan of Care (Signed)

## 2021-10-29 NOTE — Transfer of Care (Signed)
Immediate Anesthesia Transfer of Care Note  Patient: Jared Carey  Procedure(s) Performed: OPEN REDUCTION INTERNAL FIXATION (ORIF) ANKLE FRACTURE (Right: Ankle)  Patient Location: PACU  Anesthesia Type:GA combined with regional for post-op pain  Level of Consciousness: awake, alert  and oriented  Airway & Oxygen Therapy: Patient Spontanous Breathing and Patient connected to face mask oxygen  Post-op Assessment: Report given to RN and Post -op Vital signs reviewed and stable  Post vital signs: Reviewed and stable  Last Vitals:  Vitals Value Taken Time  BP    Temp    Pulse 56 10/29/21 1458  Resp 19 10/29/21 1458  SpO2 100 % 10/29/21 1458    Last Pain:  Vitals:   10/29/21 0748  TempSrc: Oral  PainSc: 0-No pain      Patients Stated Pain Goal: 0 (10/27/21 1054)  Complications: No notable events documented.

## 2021-10-29 NOTE — NC FL2 (Signed)
[place] LEVEL OF CARE SCREENING TOOL     IDENTIFICATION  Patient Name: Jared Carey Birthdate: [birth year] Sex: male Admission Date (Current Location): 10/24/2021  [facility] and [place] Number:  Engineering geologist and Address:  [facility], [address], [place], [place] [address]      Provider Number: [id number]  Attending Physician Name and Address:  Wyvonnia Dusky, MD  Relative Name and Phone Number:  Kieth Brightly [phone]    Current Level of Care: Hospital Recommended Level of Care: [place] Prior Approval Number:    Date Approved/Denied:   PASRR Number: [id number]  Discharge Plan: SNF    Current Diagnoses: Patient Active Problem List   Diagnosis Date Noted   Complete heart block ([place]) 10/24/2021   Symptomatic bradycardia 10/24/2021   AKI (acute kidney injury) [facility])    Cardiogenic shock ([place])    Gait abnormality 03/09/2021   Occipital neuralgia 03/09/2021   Akathisia 01/18/2019   Major depressive disorder with single episode, in partial remission ([place]) 12/12/2018   BMI 45.0-49.9, adult ([place]) 12/12/2018   Barrett's esophagus determined by endoscopy 02/28/2018   Multiple thyroid nodules 02/28/2018   OAB (overactive bladder) 02/28/2018   Tardive dyskinesia 02/28/2018   History of colon polyps 02/28/2018   Attention deficit disorder (ADD) 02/28/2018   Environmental and seasonal allergies 02/28/2018   Osteoarthritis of both thumbs 02/28/2018   Not currently working due to disabled status 02/28/2018   Degeneration, intervertebral disc, lumbar 02/28/2018   Constipation due to slow transit 02/28/2018   Hyperlipidemia 02/28/2018   GERD with esophagitis 08/07/2016   HTN (hypertension) 08/07/2016   Depression 08/07/2016    Orientation RESPIRATION BLADDER Height & Weight     Self, Time, Situation, Place  Normal Continent Weight: (!) 339 lb 11.7 oz (154.1 kg) Height:  6' (182.9 cm)  BEHAVIORAL  SYMPTOMS/MOOD NEUROLOGICAL BOWEL NUTRITION STATUS      Continent Diet (see discharge summary)  AMBULATORY STATUS COMMUNICATION OF NEEDS Skin   Limited Assist Verbally Normal                       Personal Care Assistance Level of Assistance  Bathing, Feeding, Total care, Dressing Bathing Assistance: Limited assistance Feeding assistance: Independent Dressing Assistance: Limited assistance Total Care Assistance: Limited assistance   Functional Limitations Info  Sight, Hearing, Speech Sight Info: Adequate Hearing Info: Adequate Speech Info: Adequate    SPECIAL CARE FACTORS FREQUENCY  PT (By licensed PT), OT (By licensed OT)     PT Frequency: min 4x weekly OT Frequency: min 4x weekly            Contractures Contractures Info: Not present    Additional Factors Info  Code Status, Allergies Code Status Info: full Allergies Info: Aripiprazole   Naproxen   Flexeril (Cyclobenzaprine)   Gabapentin   Benadryl (Diphenhydramine Hcl (Sleep))   Omeprazole   Robaxin (Methocarbamol)   Simvastatin           Current Medications (10/29/2021):  This is the current hospital active medication list Current Facility-Administered Medications  Medication Dose Route Frequency Provider Last Rate Last Admin   [MAR Hold] 0.9 %  sodium chloride infusion  250 mL Intravenous PRN Leonie Man, MD 0 mL/hr at 10/26/21 1430 Continued from Pre-op at 10/29/21 1310   0.9 % irrigation (POUR BTL)    PRN Hessie Knows, MD   1,000 mL at 10/29/21 1341   [MAR Hold] alum & mag hydroxide-simeth (MAALOX/MYLANTA)  200-200-20 MG/5ML suspension 30 mL  30 mL Oral Q6H PRN Bradly Bienenstock, NP       [MAR Hold] amLODipine (NORVASC) tablet 5 mg  5 mg Oral Daily Bennie Pierini, MD   5 mg at 10/29/21 0838   [MAR Hold] Ampicillin-Sulbactam (UNASYN) 3 g in sodium chloride 0.9 % 100 mL IVPB  3 g Intravenous Q6H Darel Hong D, NP 200 mL/hr at 10/29/21 0605 100 mL at 10/29/21 1316   [MAR Hold] aspirin chewable  tablet 81 mg  81 mg Oral Daily Bennie Pierini, MD   81 mg at 10/29/21 1106   [MAR Hold] carbamazepine (TEGRETOL) tablet 200 mg  200 mg Oral BID Dallie Piles, RPH   200 mg at 10/29/21 0839   [MAR Hold] chlorhexidine gluconate (MEDLINE KIT) (PERIDEX) 0.12 % solution 15 mL  15 mL Mouth Rinse BID Bennie Pierini, MD   15 mL at 10/29/21 0842   [MAR Hold] Chlorhexidine Gluconate Cloth 2 % PADS 6 each  6 each Topical Q0600 Bennie Pierini, MD   6 each at 10/29/21 0606   [MAR Hold] clonazePAM (KLONOPIN) disintegrating tablet 0.25 mg  0.25 mg Oral BID Bennie Pierini, MD   0.25 mg at 10/29/21 0838   [MAR Hold] docusate (COLACE) 50 MG/5ML liquid 100 mg  100 mg Oral BID PRN Bennie Pierini, MD       [facility] Hold] famotidine (PEPCID) tablet 20 mg  20 mg Oral Daily Darel Hong D, NP   20 mg at 10/29/21 0838   [MAR Hold] fluticasone (FLONASE) 50 MCG/ACT nasal spray 1 spray  1 spray Each Nare Daily Wyvonnia Dusky, MD   1 spray at 10/29/21 0841   [MAR Hold] guaiFENesin (MUCINEX) 12 hr tablet 600 mg  600 mg Oral BID Wyvonnia Dusky, MD   600 mg at 10/29/21 1105   [MAR Hold] guaiFENesin (ROBITUSSIN) 100 MG/5ML liquid 5 mL  5 mL Oral Q4H PRN Rust-Chester, Huel Cote, NP       [MAR Hold] hydrALAZINE (APRESOLINE) injection 10 mg  10 mg Intravenous Q4H PRN Bennie Pierini, MD   10 mg at 10/25/21 1713   [MAR Hold] insulin aspart (novoLOG) injection 0-15 Units  0-15 Units Subcutaneous TID WC Rust-Chester, Britton L, NP   1 Units at 10/28/21 1736   [MAR Hold] ipratropium-albuterol (DUONEB) 0.5-2.5 (3) MG/3ML nebulizer solution 3 mL  3 mL Nebulization BID Nolberto Hanlon, MD   3 mL at 10/29/21 0755   [MAR Hold] ondansetron (ZOFRAN) injection 4 mg  4 mg Intravenous Q6H PRN Leonie Man, MD       [facility] Hold] oxyCODONE-acetaminophen (PERCOCET/ROXICET) 5-325 MG per tablet 1-2 tablet  1-2 tablet Oral Q6H PRN Bennie Pierini, MD   2 tablet at 10/29/21 0616   [MAR Hold] polyethylene glycol (MIRALAX /  GLYCOLAX) packet 17 g  17 g Oral Daily PRN Bennie Pierini, MD       [facility] Hold] sodium chloride flush (NS) 0.9 % injection 3 mL  3 mL Intravenous PRN Leonie Man, MD       [MAR Hold] traZODone (DESYREL) tablet 100 mg  100 mg Oral QHS Rust-Chester, Britton L, NP   100 mg at 10/28/21 2136   [MAR Hold] venlafaxine XR (EFFEXOR-XR) 24 hr capsule 150 mg  150 mg Oral Q breakfast Bennie Pierini, MD   150 mg at 10/29/21 0814   Facility-Administered Medications Ordered in Other Encounters  Medication Dose Route Frequency Provider  Last Rate Last Admin   bupivacaine(PF) (MARCAINE) 0.5 % injection   Infiltration Anesthesia Intra-op [place] Ouch, MD   20 mL at 10/29/21 1224   dexmedetomidine (PRECEDEX) 200 MCG/50ML (4 mcg/mL) infusion   Intravenous Anesthesia Intra-op Hueske, Amy R, CRNA   4 mcg at 10/29/21 1333   ePHEDrine injection   Intravenous Anesthesia Intra-op Hueske, Amy R, CRNA   10 mg at 10/29/21 1338   fentaNYL (SUBLIMAZE) injection   Intravenous Anesthesia Intra-op Hueske, Amy R, CRNA   25 mcg at 10/29/21 1357   lidocaine (PF) (XYLOCAINE) 1 % injection   Subcutaneous Anesthesia Intra-op [place] Ouch, MD   1 mL at 10/29/21 1221   propofol (DIPRIVAN) 10 mg/mL bolus/IV push   Intravenous Anesthesia Intra-op Hueske, Amy R, CRNA   30 mg at 10/29/21 1329   propofol (DIPRIVAN) 500 MG/50ML infusion   Intravenous Continuous PRN Hueske, Amy R, CRNA 92.46 mL/hr at 10/29/21 1335 100 mcg/kg/min at 10/29/21 1335     Discharge Medications: Please see discharge summary for a list of discharge medications.  Relevant Imaging Results:  Relevant Lab Results:   Additional Information [id number]  Alberteen Sam, LCSW

## 2021-10-29 NOTE — TOC Initial Note (Signed)
Transition of Care [facility]) - Initial/Assessment Note    Patient Details  Name: Jared Carey MRN: [id number] Date of Birth: June 24, 1955  Transition of Care [facility]) CM/SW Contact:    Gildardo Griffes, LCSW Phone Number: 10/29/2021, 2:04 PM  Clinical Narrative:                  CSW spoke with patient's spouse Boyd Kerbs who reports they are in agreement with patient going to SNF With preference for [facility] since [facility] mother is there.   CSW has sent out referrals pending bed offers a this time.    Expected Discharge Plan: Skilled Nursing Facility Barriers to Discharge: Continued Medical Work up   Patient Goals and CMS Choice Patient states their goals for this hospitalization and ongoing recovery are:: to go home CMS Medicare.gov Compare Post Acute Care list provided to:: Patient Represenative (must comment) (spouse) Choice offered to / list presented to : Spouse  Expected Discharge Plan and Services Expected Discharge Plan: Skilled Nursing Facility       Living arrangements for the past 2 months: Single Family Home                                      Prior Living Arrangements/Services Living arrangements for the past 2 months: Single Family Home Lives with:: Spouse                   Activities of Daily Living   ADL Screening (condition at time of admission) Patient's cognitive ability adequate to safely complete daily activities?: Yes Is the patient deaf or have difficulty hearing?: No Does the patient have difficulty seeing, even when wearing glasses/contacts?: No Does the patient have difficulty concentrating, remembering, or making decisions?: No Patient able to express need for assistance with ADLs?: Yes Does the patient have difficulty dressing or bathing?: Yes Independently performs ADLs?: Yes (appropriate for developmental age) Does the patient have difficulty walking or climbing stairs?: Yes Weakness of Legs: Right Weakness of Arms/Hands:  None  Permission Sought/Granted                  Emotional Assessment       Orientation: : Oriented to Self, Oriented to Place, Oriented to  Time, Oriented to Situation Alcohol / Substance Use: Not Applicable Psych Involvement: No (comment)  Admission diagnosis:  Cardiogenic shock (HCC) [R57.0] Heart block [I45.9] Complete heart block (HCC) [I44.2] Symptomatic bradycardia [R00.1] Patient Active Problem List   Diagnosis Date Noted   Complete heart block (HCC) 10/24/2021   Symptomatic bradycardia 10/24/2021   AKI (acute kidney injury) (HCC)    Cardiogenic shock (HCC)    Gait abnormality 03/09/2021   Occipital neuralgia 03/09/2021   Akathisia 01/18/2019   Major depressive disorder with single episode, in partial remission (HCC) 12/12/2018   BMI 45.0-49.9, adult (HCC) 12/12/2018   Barrett's esophagus determined by endoscopy 02/28/2018   Multiple thyroid nodules 02/28/2018   OAB (overactive bladder) 02/28/2018   Tardive dyskinesia 02/28/2018   History of colon polyps 02/28/2018   Attention deficit disorder (ADD) 02/28/2018   Environmental and seasonal allergies 02/28/2018   Osteoarthritis of both thumbs 02/28/2018   Not currently working due to disabled status 02/28/2018   Degeneration, intervertebral disc, lumbar 02/28/2018   Constipation due to slow transit 02/28/2018   Hyperlipidemia 02/28/2018   GERD with esophagitis 08/07/2016   HTN (hypertension) 08/07/2016   Depression 08/07/2016  PCP:  Nada Boozer, MD Pharmacy:   [facility] [phone] - [place], [place] - [address] [place] [address] Phone: [phone] Fax: [phone]     Social Determinants of Health (SDOH) Interventions    Readmission Risk Interventions     View : No data to display.

## 2021-10-29 NOTE — Progress Notes (Signed)
PROGRESS NOTE    Jared Carey  ARW:[id number] DOB: 04-04-1956 DOA: 10/24/2021 PCP: Beacher May, MD   Assessment & Plan:   Principal Problem:   Complete heart block [facility]) Active Problems:   AKI (acute kidney injury) ([place])   Cardiogenic shock ([place])   Symptomatic bradycardia  Assessment and Plan: Cardiogenic shock: secondary to complete heart block. Was previously taking propranolol. S/p cardiac cath that showed normal coronary arteries, EF 45-50% w/ global hypokinesis. Temporary pacer removed 10/26/21. Hold propranolol. No further cardiac inpatient work-up as per cardio. Cardio recs apprec   Acute hypoxic and hypercarbic respiratory failure: s/p intubation, ventilation & extubation. Rhinovirus was positive & possible COPD. Continue on bronchodilators. Weaned off of supplemental oxygen.    Hypokalemia: WNL today    Mood Disorder: unknown severity or type. Continue on home dose of venlafaxine & carbamazepine    Right bimalleolar ankle fracture:  with minimal displacement. Ankle surg today as per ortho surg. Ortho surg recs apprec    Possible cellulitis of RLE: continue on IV unasyn   Morbidly obese: BMI 46.0. Complicates overall care & prognosis      DVT prophylaxis: lovenox  Code Status: full  Family Communication: discussed pt's care w/ pt's family at bedside and answered their questions Disposition Plan: possibly d/c to SNF.  Level of care: Telemetry Cardiac  Status is: Inpatient Remains inpatient appropriate because: ankle surg today     Consultants:  Cardio Ortho surg   Procedures:   Antimicrobials: unasyn   Subjective: Pt c/o increased mucus production  Objective: Vitals:   10/28/21 2314 10/29/21 0304 10/29/21 0423 10/29/21 0748  BP: 125/67 125/78  135/81  Pulse: 81 80  79  Resp: 20 20  18   Temp: 98.2 F (36.8 C) 98.1 F (36.7 C)  97.7 F (36.5 C)  TempSrc:    Oral  SpO2: 96% 98%  96%  Weight:   (!) 154.1 kg   Height:        Intake/Output  Summary (Last 24 hours) at 10/29/2021 0804 Last data filed at 10/29/2021 0748 Gross per 24 hour  Intake 240 ml  Output 3900 ml  Net -3660 ml   Filed Weights   10/27/21 0416 10/28/21 0622 10/29/21 0423  Weight: (!) 153.3 kg (!) 154.7 kg (!) 154.1 kg    Examination:  General exam: Appears comfortable. Morbidly obese Respiratory system: decreased breath sounds b/l otherwise clear  Cardiovascular system: S1/S2+. No rubs or clicks  Gastrointestinal system: Abd is soft, NT, obese & hypoactive bowel sounds  Central nervous system: Alert and oriented. Moves all extremities  Psychiatry: judgement and insight appears normal. Appropriate mood and affect     Data Reviewed: I have personally reviewed following labs and imaging studies  CBC: Recent Labs  Lab 10/24/21 1749 10/25/21 0412 10/26/21 0416 10/27/21 0328 10/28/21 0733  WBC 16.4* 11.7* 9.1 6.6 5.6  HGB 13.7 13.2 13.3 12.6* 13.0  HCT 40.8 39.1 40.2 37.1* 38.7*  MCV 87.4 87.9 88.5 88.3 88.4  PLT 276 251 215 207 164   Basic Metabolic Panel: Recent Labs  Lab 10/24/21 1320 10/24/21 1336 10/24/21 1749 10/25/21 0412 10/25/21 1343 10/26/21 0416 10/27/21 0328 10/28/21 0733  NA 132*  --  134* 134*  --  138 137  --   K 5.7*  --  4.2 3.3* 3.6 3.5 3.2* 3.5  CL 95*  --  98 99  --  105 105  --   CO2 17*  --  23 23  --  19*  24  --   GLUCOSE 272*  --  160* 155*  --  95 120*  --   BUN 15  --  19 19  --  11 12  --   CREATININE 1.43*  --  1.12 1.11  --  0.93 0.84  --   CALCIUM 8.9  --  8.2* 8.0*  --  8.4* 8.2*  --   MG  --  3.0*  --  2.2  --  2.3 2.1 2.2  PHOS  --   --  5.0* 4.2  --  3.0 3.0 3.4   GFR: Estimated Creatinine Clearance: 132.4 mL/min (by C-G formula based on SCr of 0.84 mg/dL). Liver Function Tests: No results for input(s): AST, ALT, ALKPHOS, BILITOT, PROT, ALBUMIN in the last 168 hours. No results for input(s): LIPASE, AMYLASE in the last 168 hours. No results for input(s): AMMONIA in the last 168  hours. Coagulation Profile: Recent Labs  Lab 10/24/21 1336  INR 1.1   Cardiac Enzymes: No results for input(s): CKTOTAL, CKMB, CKMBINDEX, TROPONINI in the last 168 hours. BNP (last 3 results) No results for input(s): PROBNP in the last 8760 hours. HbA1C: No results for input(s): HGBA1C in the last 72 hours. CBG: Recent Labs  Lab 10/27/21 2006 10/28/21 0807 10/28/21 1222 10/28/21 1634 10/28/21 2122  GLUCAP 150* 98 116* 123* 120*   Lipid Profile: No results for input(s): CHOL, HDL, LDLCALC, TRIG, CHOLHDL, LDLDIRECT in the last 72 hours. Thyroid Function Tests: No results for input(s): TSH, T4TOTAL, FREET4, T3FREE, THYROIDAB in the last 72 hours. Anemia Panel: No results for input(s): VITAMINB12, FOLATE, FERRITIN, TIBC, IRON, RETICCTPCT in the last 72 hours. Sepsis Labs: Recent Labs  Lab 10/24/21 1303 10/24/21 1749  LATICACIDVEN 3.9* 1.3    Recent Results (from the past 240 hour(s))  Resp Panel by RT-PCR (Flu A&B, Covid) Nasopharyngeal Swab     Status: None   Collection Time: 10/24/21  1:35 PM   Specimen: Nasopharyngeal Swab; Nasopharyngeal(NP) swabs in vial transport medium  Result Value Ref Range Status   SARS Coronavirus 2 by RT PCR NEGATIVE NEGATIVE Final    Comment: (NOTE) SARS-CoV-2 target nucleic acids are NOT DETECTED.  The SARS-CoV-2 RNA is generally detectable in upper respiratory specimens during the acute phase of infection. The lowest concentration of SARS-CoV-2 viral copies this assay can detect is 138 copies/mL. A negative result does not preclude SARS-Cov-2 infection and should not be used as the sole basis for treatment or other patient management decisions. A negative result may occur with  improper specimen collection/handling, submission of specimen other than nasopharyngeal swab, presence of viral mutation(s) within the areas targeted by this assay, and inadequate number of viral copies(<138 copies/mL). A negative result must be combined  with clinical observations, patient history, and epidemiological information. The expected result is Negative.  Fact Sheet for Patients:  EntrepreneurPulse.com.au  Fact Sheet for Healthcare Providers:  IncredibleEmployment.be  This test is no t yet approved or cleared by the [place] FDA and  has been authorized for detection and/or diagnosis of SARS-CoV-2 by FDA under an Emergency Use Authorization (EUA). This EUA will remain  in effect (meaning this test can be used) for the duration of the COVID-19 declaration under Section 564(b)(1) of the Act, 21 U.S.C.section 360bbb-3(b)(1), unless the authorization is terminated  or revoked sooner.       Influenza A by PCR NEGATIVE NEGATIVE Final   Influenza B by PCR NEGATIVE NEGATIVE Final    Comment: (NOTE) The Xpert Xpress  SARS-CoV-2/FLU/RSV plus assay is intended as an aid in the diagnosis of influenza from Nasopharyngeal swab specimens and should not be used as a sole basis for treatment. Nasal washings and aspirates are unacceptable for Xpert Xpress SARS-CoV-2/FLU/RSV testing.  Fact Sheet for Patients: EntrepreneurPulse.com.au  Fact Sheet for Healthcare Providers: IncredibleEmployment.be  This test is not yet approved or cleared by the [place] FDA and has been authorized for detection and/or diagnosis of SARS-CoV-2 by FDA under an Emergency Use Authorization (EUA). This EUA will remain in effect (meaning this test can be used) for the duration of the COVID-19 declaration under Section 564(b)(1) of the Act, 21 U.S.C. section 360bbb-3(b)(1), unless the authorization is terminated or revoked.  Performed at [facility], [address] [place], [place] [address]   MRSA Next Gen by PCR, Nasal     Status: None   Collection Time: 10/24/21  4:45 PM   Specimen: Nasal Mucosa; Nasal Swab  Result Value Ref Range Status   MRSA by PCR Next Gen  NOT DETECTED NOT DETECTED Final    Comment: (NOTE) The GeneXpert MRSA Assay (FDA approved for NASAL specimens only), is one component of a comprehensive MRSA colonization surveillance program. It is not intended to diagnose MRSA infection nor to guide or monitor treatment for MRSA infections. Test performance is not FDA approved in patients less than 75 years old. Performed at [facility], [address] [place], [place] [address]   Blood culture (routine x 2)     Status: None   Collection Time: 10/24/21  5:49 PM   Specimen: BLOOD  Result Value Ref Range Status   Specimen Description BLOOD BOTTLES DRAWN AEROBIC AND ANAEROBIC  Final   Special Requests   Final    Blood Culture results may not be optimal due to an inadequate volume of blood received in culture bottles RIGHT ANTECUBITAL   Culture   Final    NO GROWTH 5 DAYS Performed at [facility], [address] [place], [place] [address]    Report Status 10/29/2021 FINAL  Final  Blood culture (routine x 2)     Status: None   Collection Time: 10/24/21  5:51 PM   Specimen: BLOOD  Result Value Ref Range Status   Specimen Description BLOOD BOTTLES DRAWN AEROBIC AND ANAEROBIC  Final   Special Requests   Final    Blood Culture results may not be optimal due to an inadequate volume of blood received in culture bottles LEFT ANTECUBITAL   Culture   Final    NO GROWTH 5 DAYS Performed at [facility], [address] [place], [place] [address]    Report Status 10/29/2021 FINAL  Final  Respiratory (~20 pathogens) panel by PCR     Status: Abnormal   Collection Time: 10/24/21 10:30 PM   Specimen: Nasopharyngeal Swab; Respiratory  Result Value Ref Range Status   Adenovirus NOT DETECTED NOT DETECTED Final   Coronavirus 229E NOT DETECTED NOT DETECTED Final    Comment: (NOTE) The Coronavirus on the Respiratory Panel, DOES NOT test for the novel  Coronavirus (2019 nCoV)    Coronavirus HKU1 NOT DETECTED NOT  DETECTED Final   Coronavirus NL63 NOT DETECTED NOT DETECTED Final   Coronavirus OC43 NOT DETECTED NOT DETECTED Final   Metapneumovirus NOT DETECTED NOT DETECTED Final   Rhinovirus / Enterovirus DETECTED (A) NOT DETECTED Final   Influenza A NOT DETECTED NOT DETECTED Final   Influenza B NOT DETECTED NOT DETECTED Final   Parainfluenza Virus  1 NOT DETECTED NOT DETECTED Final   Parainfluenza Virus 2 NOT DETECTED NOT DETECTED Final   Parainfluenza Virus 3 NOT DETECTED NOT DETECTED Final   Parainfluenza Virus 4 NOT DETECTED NOT DETECTED Final   Respiratory Syncytial Virus NOT DETECTED NOT DETECTED Final   Bordetella pertussis NOT DETECTED NOT DETECTED Final   Bordetella Parapertussis NOT DETECTED NOT DETECTED Final   Chlamydophila pneumoniae NOT DETECTED NOT DETECTED Final   Mycoplasma pneumoniae NOT DETECTED NOT DETECTED Final    Comment: Performed at [facility], [place] [address] [place], [place] [address]         Radiology Studies: No results found.      Scheduled Meds:  amLODipine  5 mg Oral Daily   aspirin  81 mg Oral Daily   carbamazepine  200 mg Oral BID   chlorhexidine gluconate (MEDLINE KIT)  15 mL Mouth Rinse BID   Chlorhexidine Gluconate Cloth  6 each Topical Q0600   clonazePAM  0.25 mg Oral BID   famotidine  20 mg Oral Daily   fluticasone  1 spray Each Nare Daily   insulin aspart  0-15 Units Subcutaneous TID WC   ipratropium-albuterol  3 mL Nebulization BID   tamsulosin  0.4 mg Oral Daily   traZODone  100 mg Oral QHS   venlafaxine XR  150 mg Oral Q breakfast   Continuous Infusions:  sodium chloride Stopped (10/26/21 1430)   ampicillin-sulbactam (UNASYN) IV 3 g (10/29/21 0605)     LOS: 5 days    Time spent: 30 mins    Wyvonnia Dusky, MD [facility] Pager [phone]  If 7PM-7AM, please contact night-coverage 10/29/2021, 8:04 AM

## 2021-10-29 NOTE — Anesthesia Preprocedure Evaluation (Addendum)
Anesthesia Evaluation  Patient identified by MRN, date of birth, ID band Patient awake    Reviewed: Allergy & Precautions, NPO status , Patient's Chart, lab work & pertinent test results  Airway Mallampati: III       Dental  (+) Edentulous Lower, Edentulous Upper   Pulmonary shortness of breath, former smoker,  PRN bronchodilators     + decreased breath sounds      Cardiovascular Exercise Tolerance: Poor hypertension,  Rhythm:Regular Rate:Normal + Peripheral Edema 5/17 EKG: Normal sinus rhythm Left axis deviation Right bundle branch block Abnormal ECG  LHC : .  LV end diastolic pressure is severely elevated.  Successful placement of Temporary Transvenous Pacemaker via 6 French RFV 23 cm sheath ->   with loop and RA, of the catheter was advanced to 78-79 cm. =>   Rate set at 60 bpm, 5 MA with threshold of 1 MA.  Initially set at 70 bpm, reduced to 60.  (Patient actually had a intrinsic rhythm upon completion of procedure)  Angiographically Normal Coronary Arteries  Relatively preserved LVEF (estimated 45 to 50%, with global hypokinesis)   EDP of 28-30 mmHg   (40 mg IV Lasix administered)    Neuro/Psych PSYCHIATRIC DISORDERS Depression Gait abnormality Occipital neuralgia    GI/Hepatic Neg liver ROS, hiatal hernia, GERD  Controlled,  Endo/Other  Morbid obesity  Renal/GU negative Renal ROS  negative genitourinary   Musculoskeletal negative musculoskeletal ROS (+)   Abdominal (+) + obese,   Peds negative pediatric ROS (+)  Hematology  (+) Blood dyscrasia, anemia ,   Anesthesia Other Findings Cardiogenic shock and acute respiratory failure resolved:  s/p intubation, ventilation & extubation  Cardiology note reviewed as below: The patient is completely recovered from his events yesterday.  After further interview the patient appears to have had some episodes of syncopal concerns over the last 4 to 5 days.  When  arriving to the emergency room the patient complete heart block with ventricular rate of 25.  The patient then had a temporary pacemaker wire for which she has done well.  At that time he did have some hyperkalemia and renal dysfunction with continued use of propranolol.  It is unclear whether this is the primary cause of his heart block.  There has been no evidence of coronary artery disease by cardiac catheterization and normal LV systolic function by echocardiogram with no evidence of valvular heart disease.  There is no other primary apparent cause of his heart block.  We have discussed at length with he and the family about the possibility of continued observation with his current evaluation until suggesting that he may be able to avoid pacemaker placement if the patient does not have any further event or heart block.  This would suggest that the propranolol was the primary cause.  5/15 overall still doing well without new heart block since admission. Temp wire removed and patient still doing well . No new cardiac concerns  5/16.  Overall patient continues to improve with no evidence of concerns of heart block.  Heart rate shows sinus bradycardia at rest at 58 bpm but no evidence of advanced heart block since admission.  Patient had discontinuation of propranolol for that reason.  The patient does have significant fracture of his ankle which may need further intervention.  Currently he has had normal coronary arteries by cardiac catheterization normal LV systolic function and no evidence of further rhythm disturbances and therefore is at lowest risk possible for orthopedic surgery.  Reproductive/Obstetrics negative  OB ROS                          Anesthesia Physical Anesthesia Plan  ASA: 3  Anesthesia Plan: General   Post-op Pain Management: Regional block*   Induction: Intravenous  PONV Risk Score and Plan:   Airway Management Planned: Natural Airway and Simple Face  Mask  Additional Equipment:   Intra-op Plan:   Post-operative Plan:   Informed Consent: I have reviewed the patients History and Physical, chart, labs and discussed the procedure including the risks, benefits and alternatives for the proposed anesthesia with the patient or authorized representative who has indicated his/her understanding and acceptance.     Dental advisory given  Plan Discussed with: Anesthesiologist and CRNA  Anesthesia Plan Comments:         Anesthesia Quick Evaluation

## 2021-10-29 NOTE — Anesthesia Procedure Notes (Addendum)
Anesthesia Regional Block: Adductor canal block   Pre-Anesthetic Checklist: , timeout performed,  Correct Patient, Correct Site, Correct Laterality,  Correct Procedure, Correct Position, site marked,  Risks and benefits discussed,  Surgical consent,  Pre-op evaluation,  At surgeon's request and post-op pain management  Laterality: Right  Prep: chloraprep       Needles:  Injection technique: Single-shot  Needle Type: Stimiplex     Needle Length: 9cm  Needle Gauge: 22     Additional Needles:   Procedures:,,,, ultrasound used (permanent image in chart),,    Narrative:  Start time: 10/29/2021 12:17 PM End time: 10/29/2021 12:20 PM Injection made incrementally with aspirations every 20 mL.  Performed by: Personally  Anesthesiologist: Foye Deer, MD  Additional Notes: Patient consented for risk and benefits of nerve block including but not limited to nerve damage, failed block, bleeding and infection.  Patient voiced understanding.  Functioning IV was confirmed and monitors were applied.  Timeout done prior to procedure and prior to any sedation being given to the patient.  Patient confirmed procedure site prior to any sedation given to the patient.  A 9mm 22ga Stimuplex needle was used. Sterile prep,hand hygiene and sterile gloves were used.  Minimal sedation used for procedure.  No paresthesia endorsed by patient during the procedure.  Negative aspiration and negative test dose prior to incremental administration of local anesthetic. The patient tolerated the procedure well with no immediate complications.

## 2021-10-30 ENCOUNTER — Encounter: Payer: Self-pay | Admitting: Orthopedic Surgery

## 2021-10-30 DIAGNOSIS — S82841A Displaced bimalleolar fracture of right lower leg, initial encounter for closed fracture: Secondary | ICD-10-CM | POA: Diagnosis not present

## 2021-10-30 DIAGNOSIS — I442 Atrioventricular block, complete: Secondary | ICD-10-CM | POA: Diagnosis not present

## 2021-10-30 LAB — CBC
HCT: 36.1 % — ABNORMAL LOW (ref 39.0–52.0)
Hemoglobin: 12.1 g/dL — ABNORMAL LOW (ref 13.0–17.0)
MCH: 30.1 pg (ref 26.0–34.0)
MCHC: 33.5 g/dL (ref 30.0–36.0)
MCV: 89.8 fL (ref 80.0–100.0)
Platelets: 204 10*3/uL (ref 150–400)
RBC: 4.02 MIL/uL — ABNORMAL LOW (ref 4.22–5.81)
RDW: 12.4 % (ref 11.5–15.5)
WBC: 9 10*3/uL (ref 4.0–10.5)
nRBC: 0 % (ref 0.0–0.2)

## 2021-10-30 LAB — BASIC METABOLIC PANEL
Anion gap: 10 (ref 5–15)
BUN: 10 mg/dL (ref 8–23)
CO2: 25 mmol/L (ref 22–32)
Calcium: 8.2 mg/dL — ABNORMAL LOW (ref 8.9–10.3)
Chloride: 103 mmol/L (ref 98–111)
Creatinine, Ser: 0.87 mg/dL (ref 0.61–1.24)
GFR, Estimated: >60 mL/min (ref >60)
Glucose, Bld: 121 mg/dL — ABNORMAL HIGH (ref 70–99)
Potassium: 3.6 mmol/L (ref 3.5–5.1)
Sodium: 138 mmol/L (ref 135–145)

## 2021-10-30 LAB — PHOSPHORUS: Phosphorus: 3.8 mg/dL (ref 2.5–4.6)

## 2021-10-30 LAB — GLUCOSE, CAPILLARY
Glucose-Capillary: 130 mg/dL — ABNORMAL HIGH (ref 70–99)
Glucose-Capillary: 110 mg/dL — ABNORMAL HIGH (ref 70–99)
Glucose-Capillary: 133 mg/dL — ABNORMAL HIGH (ref 70–99)
Glucose-Capillary: 147 mg/dL — ABNORMAL HIGH (ref 70–99)

## 2021-10-30 LAB — MAGNESIUM: Magnesium: 2.1 mg/dL (ref 1.7–2.4)

## 2021-10-30 MED ORDER — MORPHINE SULFATE (PF) 2 MG/ML IV SOLN
1.0000 mg | INTRAVENOUS | Status: DC | PRN
  Administered 2021-10-30 – 2021-11-03 (×8): 1 mg via INTRAVENOUS
  Filled 2021-10-30 (×8): qty 1

## 2021-10-30 MED ORDER — DOCUSATE SODIUM 100 MG PO CAPS
100.0000 mg | ORAL_CAPSULE | Freq: Two times a day (BID) | ORAL | Status: DC
  Administered 2021-10-30 – 2021-11-03 (×9): 100 mg via ORAL
  Filled 2021-10-30 (×9): qty 1

## 2021-10-30 MED ORDER — ONDANSETRON HCL 4 MG PO TABS: 4.0000 mg | ORAL_TABLET | Freq: Four times a day (QID) | ORAL | Status: DC | PRN

## 2021-10-30 MED ORDER — ENOXAPARIN SODIUM 80 MG/0.8ML IJ SOSY
0.5000 mg/kg | PREFILLED_SYRINGE | INTRAMUSCULAR | Status: DC
  Administered 2021-10-30 – 2021-11-03 (×5): 77.5 mg via SUBCUTANEOUS
  Filled 2021-10-30 (×5): qty 0.8

## 2021-10-30 MED ORDER — AMOXICILLIN-POT CLAVULANATE 875-125 MG PO TABS
1.0000 | ORAL_TABLET | Freq: Two times a day (BID) | ORAL | Status: AC
  Administered 2021-10-30 – 2021-11-01 (×6): 1 via ORAL
  Filled 2021-10-30 (×6): qty 1

## 2021-10-30 MED ORDER — ONDANSETRON HCL 4 MG/2ML IJ SOLN: 4.0000 mg | Freq: Four times a day (QID) | INTRAMUSCULAR | Status: DC | PRN

## 2021-10-30 MED ORDER — HYDROMORPHONE HCL 1 MG/ML IJ SOLN
1.0000 mg | Freq: Once | INTRAMUSCULAR | Status: AC
  Administered 2021-10-30: 1 mg via INTRAVENOUS
  Filled 2021-10-30: qty 1

## 2021-10-30 NOTE — Evaluation (Signed)
Physical Therapy Evaluation (postoperative)   Patient Details Name: Jared Carey MRN: [id number] DOB: Apr 14, 1956 Today's Date: 10/30/2021  History of Present Illness  Jared Carey is a 64yoM with PMH akathisia, anemia, anxiety, depression, dsyphagia, HTN, MDD, and tardive dyskinesia found unresponsive at his home, bradycardic.  External pacing initiated by EMS after EKG in the field showed complete heart block. Pacing was discontinued in ED after epinephrine gtt started, however, complete heart block recurred and he became hypoxic. Patient was subsequently intubated. Cardiac catheterization performed for coronary angiography (which showed normal coronaries) and temp perm pacemaker was placed and later removed on 10/26/21.  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 Pt is posoperatively TTWB.  Clinical Impression  Pt admitted with above diagnosis. Pt currently with functional limitations due to the deficits listed below (see "PT Problem List"). Upon entry, pt in bed, awake and agreeable to participate. The pt is alert, pleasant, interactive, and able to confirm info regarding prior level of function, both in tolerance and independence. Pt reports 5/10 pain at rest at entry, this remains unchanged throughout- pt received morphine prior to entry. Reviewed TTWB with patient as indicated per secure chat with Dr. Kennedy Bucker. Supervision to EOB left where pt is dizzy for remaining session. Pt reports dizziness is not similar to historical presyncopal dizziness and he cannot r/o it being attributed to morphine. Pt struggles to rise to standing  the first attempts, but after education, bed elevation, technique modification, pt is able to rise to standing twice c 60sec standing. Pt given cues to RW placement to optimize balance. At EOS pt is assisted with EOB lateral scoot toward [facility] to make bed rail more within reach prn. Pt elects to remain at EOB as recommended for postoperative pulmonary hygiene and pending lunch tray. Pt still appropriate for STR at this time given extreme difficulty with basic mobility and heavy physical assistance needs Pt is agreeable to STR placement. Pt's wife in room throughout session as well. Patient's performance this date reveals decreased ability, independence, and tolerance in performing all basic mobility required for performance of activities of daily living. Pt requires additional DME, close physical assistance, and cues for safe participate in mobility. Pt will benefit from skilled PT intervention to increase independence and safety with basic mobility in preparation for discharge to the venue listed below.        Recommendations for follow up therapy are one component of a multi-disciplinary discharge planning process, led by the attending physician.  Recommendations may be updated based on patient status, additional functional criteria and insurance authorization.  Follow Up Recommendations Skilled nursing-short term rehab (<3 hours/day)    Assistance Recommended at Discharge Intermittent Supervision/Assistance  Patient can return home with the following  Two people to help with walking and/or transfers;Two people to help with bathing/dressing/bathroom;A lot of help with bathing/dressing/bathroom;Assistance with cooking/housework;Help with stairs or ramp for entrance;Assist for transportation    Equipment Recommendations None recommended by PT (defer to rehab)  Recommendations for Other Services       Functional Status Assessment Patient has had a recent decline in their functional status and  demonstrates the ability to make significant improvements in function in a reasonable and predictable  amount of time.     Precautions / Restrictions Precautions Precautions: Fall Restrictions Weight Bearing Restrictions: Yes RLE Weight Bearing: Touchdown weight bearing Other Position/Activity Restrictions: per smart chat Dr. Rosita Kea 5/19      Mobility  Bed Mobility Overal bed mobility: Needs Assistance Bed Mobility: Supine to Sit     Supine to sit: Supervision     General bed mobility comments: moving fairly well, but questionable mount of weight through heel on mattress for hip extension strategy to EOB Left; Likely to be default TTWB if going to EOB Rt.    Transfers Overall transfer level: Needs assistance Equipment used: Rolling walker (2 wheels) Transfers: Sit to/from Stand Sit to Stand: Mod assist           General transfer comment: struggles with maintaining precautions due to labored efforts;    Ambulation/Gait                  Stairs            Wheelchair Mobility    Modified Rankin (Stroke Patients Only)       Balance                                             Pertinent Vitals/Pain Pain Assessment Pain Assessment: 0-10 Pain Score: 5  Pain Location: Rt heel throughout    Home Living Family/patient expects to be discharged to:: Private residence Living Arrangements: Spouse/significant other Available Help at Discharge: Family;Available PRN/intermittently Type of Home: Mobile home Home Access: Stairs to enter Entrance Stairs-Rails: Right;Left Entrance Stairs-Number of Steps: 4   Home Layout: One level Home Equipment: Shower seat;Cane - single point;Rollator (4 wheels)      Prior Function Prior Level of Function : Independent/Modified Independent             Mobility Comments: Mod Ind amb in the home with a SPC and limited community distances with a rollator; 4 falls in the last week related to syncopal  episodes, extensive prior fall histroy seconary to dyskinesia per patient ADLs Comments: Pt reports mod I with ADLs and IADLs but is not very active outside of home.     Hand Dominance   Dominant Hand: Right    Extremity/Trunk Assessment   Upper Extremity Assessment Upper Extremity Assessment: Generalized weakness    Lower Extremity Assessment Lower Extremity Assessment: Generalized weakness       Communication   Communication: No difficulties  Cognition Arousal/Alertness: Awake/alert Behavior During Therapy: WFL for tasks assessed/performed Overall Cognitive Status: Within Functional Limits for tasks assessed                                          General Comments      Exercises Other Exercises Other Exercises: minGuardA STS from elevated EOB, BRW, RLE TDWB, sustained standing x60sec, minGuard assist return to sitting EOB (performed twice in session)   Assessment/Plan    PT Assessment Patient needs continued PT services  PT Problem List Decreased strength;Decreased activity tolerance;Decreased balance;Decreased mobility;Decreased knowledge of use of DME;Decreased knowledge of precautions;Decreased range of motion       PT Treatment Interventions DME instruction;Gait training;Stair training;Functional mobility training;Therapeutic activities;Therapeutic exercise;Balance training;Patient/family education    PT Goals (Current goals can be found in the Care Plan  section)  Acute Rehab PT Goals Patient Stated Goal: To get up and move better PT Goal Formulation: With patient Time For Goal Achievement: 11/13/21 Potential to Achieve Goals: Good    Frequency 7X/week     Co-evaluation               AM-PAC PT "6 Clicks" Mobility  Outcome Measure Help needed turning from your back to your side while in a flat bed without using bedrails?: A Little Help needed moving from lying on your back to sitting on the side of a flat bed without using  bedrails?: A Little Help needed moving to and from a bed to a chair (including a wheelchair)?: A Lot Help needed standing up from a chair using your arms (e.g., wheelchair or bedside chair)?: A Lot Help needed to walk in hospital room?: A Lot Help needed climbing 3-5 steps with a railing? : Total 6 Click Score: 13    End of Session   Activity Tolerance: Patient tolerated treatment well;No increased pain (persistent dizziness and HR <60bpm throughout. Did not attempt stepping this date.) Patient left: in bed;with family/visitor present;with call bell/phone within reach Nurse Communication: Mobility status PT Visit Diagnosis: History of falling (Z91.81);Unsteadiness on feet (R26.81);Difficulty in walking, not elsewhere classified (R26.2);Muscle weakness (generalized) (M62.81);Repeated falls (R29.6);Dizziness and giddiness (R42)    Time: [id number] PT Time Calculation (min) (ACUTE ONLY): 26 min   Charges:   PT Evaluation $PT Re-evaluation: 1 Re-eval         2:12 PM, 10/30/21 Rosamaria LintsAllan C Derwin Reddy, PT, DPT Physical Therapist - [facility]  [phone] (ASCOM)    Kainoah Bartosiewicz C 10/30/2021, 2:06 PM

## 2021-10-30 NOTE — Anesthesia Postprocedure Evaluation (Signed)
Anesthesia Post Note  Patient: Jared Carey  Procedure(s) Performed: OPEN REDUCTION INTERNAL FIXATION (ORIF) ANKLE FRACTURE (Right: Ankle)  Patient location during evaluation: PACU Anesthesia Type: General Level of consciousness: awake and alert Pain management: pain level controlled Vital Signs Assessment: post-procedure vital signs reviewed and stable Respiratory status: spontaneous breathing, nonlabored ventilation and respiratory function stable Cardiovascular status: blood pressure returned to baseline and stable Postop Assessment: no apparent nausea or vomiting Anesthetic complications: no   No notable events documented.   Last Vitals:  Vitals:   10/29/21 2313 10/30/21 0451  BP: (!) 127/57 (!) 137/59  Pulse: (!) 45 (!) 49  Resp: 20   Temp: 36.9 C 36.7 C  SpO2: 96% 97%    Last Pain:  Vitals:   10/30/21 0451  TempSrc: Oral  PainSc: 10-Worst pain ever                 [place] Ouch

## 2021-10-30 NOTE — Progress Notes (Signed)
PHARMACIST - PHYSICIAN COMMUNICATION  CONCERNING:  Enoxaparin (Lovenox) for DVT Prophylaxis    RECOMMENDATION: Patient was prescribed enoxaprin 40mg  q24 hours for VTE prophylaxis.   Filed Weights   10/28/21 0622 10/29/21 0423 10/30/21 0451  Weight: (!) 154.7 kg (341 lb 0.8 oz) (!) 154.1 kg (339 lb 11.7 oz) (!) 154.6 kg (340 lb 14.4 oz)    Body mass index is 46.23 kg/m.  Estimated Creatinine Clearance: 126.6 mL/min (by C-G formula based on SCr of 0.88 mg/dL).   Based on [place] patient is candidate for enoxaparin 0.5mg /kg TBW SQ every 24 hours based on BMI being >30.  DESCRIPTION: Pharmacy has adjusted enoxaparin dose per [facility] policy.  Patient is now receiving enoxaparin 0.5 mg/kg every 24 hours   Renda Rolls, PharmD, [facility] 10/30/2021 5:06 AM

## 2021-10-30 NOTE — Evaluation (Signed)
Occupational Therapy RE-Evaluation Patient Details Name: JOANNE BRANDER MRN: [id number] DOB: 1955/07/09 Today's Date: 10/30/2021   History of Present Illness Pt is a 66 y.o. male with PMH that includes akathisia, anemia, anxiety, depression, dsyphagia, HTN, MDD, and tardive dyskinesia found unresponsive at his home, bradycardic.  External pacing initiated by EMS after EKG in the field showed complete heart block. Pacing was discontinued in ED after epinephrine gtt started, however, complete heart block recurred and he became hypoxic. Patient was subsequently intubated. Cardiac catheterization performed for coronary angiography (which showed normal coronaries) and temp perm pacemaker was placed and later removed on 10/26/21.  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 s/p new ORIF R ankle.   Clinical Impression   Pt seen for re-evaluation after R ankle ORIF and new precautions of TTWB for R LE with functional mobility. Pt performs bed mobility with min guard but does weight bear through R LE while exiting the bed and needing cuing for safety. Pt reports feeling very dizzy with transitions but also states recently getting morphine and insulin and just starting to eat his breakfast. He is motivated for therapeutic intervention. Pt standing with max A but again unable to maintain precautions while standing and only able to maintain for less than 1 minute before returning to bed. Pt requesting to remain seated on EOB to eat breakfast and RN notified. OT continues to recommend SNF to address functional deficits at discharge.       Recommendations for follow up therapy are one component of a multi-disciplinary discharge planning process, led by the attending physician.  Recommendations may be updated based on patient status, additional functional criteria and insurance authorization.   Follow Up Recommendations  Skilled nursing-short term rehab (<3 hours/day)    Assistance Recommended at Discharge Intermittent Supervision/Assistance  Patient can return home with the following A lot of help with walking and/or transfers;A lot of help with bathing/dressing/bathroom;Help with stairs or ramp for entrance;Assist for transportation    Functional Status Assessment  Patient has had a recent decline in their functional status and demonstrates the ability to make significant improvements in function in a reasonable and predictable amount of time.  Equipment Recommendations  Other (comment) (defer to next venue of care)       Precautions / Restrictions Precautions Precautions: Fall Restrictions Weight Bearing Restrictions: Yes RLE Weight Bearing: Touchdown weight bearing      Mobility Bed Mobility Overal bed mobility: Needs Assistance Bed Mobility: Supine to Sit, Sit to Supine     Supine to sit: Min guard Sit to supine: Min guard   General bed mobility comments: although pt is partially weight bearing through R LE and needing increased cuing    Transfers Overall transfer level: Needs assistance Equipment used: Rolling walker (2 wheels) Transfers: Sit to/from Stand Sit to Stand: From elevated surface, Mod assist           General transfer comment: unable to maintain precautions      Balance Overall balance assessment: Needs assistance, History of Falls Sitting-balance support: Bilateral upper extremity supported Sitting balance-Leahy Scale: Good     Standing balance support: During functional activity, Bilateral upper extremity supported, Reliant on assistive device for balance Standing  balance-Leahy Scale: Poor                             ADL either performed or assessed with clinical judgement   ADL Overall ADL's : Needs assistance/impaired Eating/Feeding: Set up;Sitting                                           Vision Patient Visual Report: No change from baseline              Pertinent Vitals/Pain Pain Assessment Pain Assessment: Faces Faces Pain Scale: Hurts even more Pain Location: R ankle Pain Descriptors / Indicators: Aching, Discomfort Pain Intervention(s): Monitored during session, Premedicated before session, Repositioned, Ice applied     Hand Dominance Right   Extremity/Trunk Assessment Upper Extremity Assessment Upper Extremity Assessment: Generalized weakness   Lower Extremity Assessment Lower Extremity Assessment: Generalized weakness       Communication Communication Communication: No difficulties   Cognition Arousal/Alertness: Awake/alert Behavior During Therapy: WFL for tasks assessed/performed Overall Cognitive Status: Within Functional Limits for tasks assessed                                                  Home Living Family/patient expects to be discharged to:: Private residence Living Arrangements: Spouse/significant other Available Help at Discharge: Family;Available PRN/intermittently Type of Home: Mobile home Home Access: Stairs to enter Entrance Stairs-Number of Steps: 4 Entrance Stairs-Rails: Right;Left Home Layout: One level     Bathroom Shower/Tub: Walk-in shower         Home Equipment: Shower seat;Cane - single point;Rollator (4 wheels)          Prior Functioning/Environment Prior Level of Function : Independent/Modified Independent             Mobility Comments: Mod Ind amb in the home with a SPC and limited community distances with a rollator; 4 falls in the last week related to syncopal episodes, extensive prior fall histroy seconary to  dyskinesia per patient ADLs Comments: Pt reports mod I with ADLs and IADLs but is not very active outside of home.        OT Problem List: Decreased strength;Pain;Decreased activity tolerance;Decreased safety awareness;Impaired balance (sitting and/or standing);Decreased knowledge of precautions;Decreased knowledge of use of DME or AE      OT Treatment/Interventions: Self-care/ADL training;Balance training;Therapeutic exercise;Energy conservation;Therapeutic activities;DME and/or AE instruction;Visual/perceptual remediation/compensation;Patient/family education    OT Goals(Current goals can be found in the care plan section) Acute Rehab OT Goals Patient Stated Goal: to get stronger OT Goal Formulation: With patient Time For Goal Achievement: 11/13/21 Potential to Achieve Goals: Good  OT Frequency: Min 2X/week       AM-PAC OT "6 Clicks" Daily Activity     Outcome Measure Help from another person eating meals?: None Help from another person taking care of personal grooming?: A Little Help from another person toileting, which includes using toliet, bedpan, or urinal?: A Lot Help from another person bathing (including washing, rinsing, drying)?: A Lot Help from another person to put on and taking off regular upper body clothing?: A Little Help from another person to put on and taking off regular lower body clothing?: A Lot 6 Click Score: 16  End of Session Equipment Utilized During Treatment: Rolling walker (2 wheels) Nurse Communication: Mobility status  Activity Tolerance: Patient limited by pain;Other (comment) (dizziness) Patient left: in bed;with call bell/phone within reach;with bed alarm set  OT Visit Diagnosis: Unsteadiness on feet (R26.81);Muscle weakness (generalized) (M62.81);Pain Pain - Right/Left: Right Pain - part of body: Ankle and joints of foot                Time: 1050-1108 OT Time Calculation (min): 18 min Charges:  OT General Charges $OT Visit: 1 Visit OT  Evaluation $OT Re-eval: 1 Re-eval OT Treatments $Therapeutic Activity: 8-22 mins  Jackquline Denmark, MS, OTR/L , CBIS ascom [phone]  10/30/21, 4:18 PM

## 2021-10-30 NOTE — TOC Progression Note (Signed)
Transition of Care [facility]) - Progression Note    Patient Details  Name: Jared Carey MRN: [id number] Date of Birth: [birth year]  Transition of Care [facility]) CM/SW Contact  Gildardo Griffes, [place] Phone Number: 10/30/2021, 9:00 AM  Clinical Narrative:     CSW spoke with patient's spouse Boyd Kerbs confirmed patient has had zero covid vaccinations. CSW explained to Boyd Kerbs this would eliminate [facility] as a snf option.   She reports being agreeable to review other bed offers. Patient has no bed offers at this time, extended search initiated.   Expected Discharge Plan: Skilled Nursing Facility Barriers to Discharge: Continued Medical Work up  Expected Discharge Plan and Services Expected Discharge Plan: Skilled Nursing Facility       Living arrangements for the past 2 months: Single Family Home                                       Social Determinants of Health (SDOH) Interventions    Readmission Risk Interventions     View : No data to display.

## 2021-10-30 NOTE — Progress Notes (Signed)
PROGRESS NOTE    Jared Carey  ZOX:[id number] DOB: 1956/03/04 DOA: 10/24/2021 PCP: Beacher May, MD   Assessment & Plan:   Principal Problem:   Complete heart block [facility]) Active Problems:   AKI (acute kidney injury) ([place])   Cardiogenic shock ([place])   Symptomatic bradycardia  Assessment and Plan: Cardiogenic shock: secondary to complete heart block. Was previously taking propranolol. S/p cardiac cath that showed normal coronary arteries, EF 45-50% w/ global hypokinesis. Temporary pacer removed 10/26/21. D/c propranolol. No further cardiac inpatient work-up as per cardio. Cardio recs apprec   Acute hypoxic and hypercarbic respiratory failure: s/p intubation, ventilation & extubation. Rhinovirus was positive & possible COPD. Continue on bronchodilators. Resolved    Hypokalemia: WNL today    Mood Disorder: unknown severity or type. Continue on carbamazepine & venlafaxine    Right bimalleolar ankle fracture:  with minimal displacement. S/p open reduction internal fixation of right ankle fracture    Possible cellulitis of RLE: change abxs to po augmentin x 2 days more    Morbidly obese: BMI 46.2. Complicates overall care & prognosis     DVT prophylaxis: lovenox  Code Status: full  Family Communication: discussed pt's care w/ pt's family at bedside and answered their questions Disposition Plan: possibly d/c to SNF.  Level of care: Telemetry Cardiac  Status is: Inpatient Remains inpatient appropriate because: likely d/c to SNF     Consultants:  Cardio Ortho surg   Procedures:   Antimicrobials: unasyn   Subjective: Pt c/o right ankle pain   Objective: Vitals:   10/29/21 1957 10/29/21 2029 10/29/21 2313 10/30/21 0451  BP: (!) 135/52  (!) 127/57 (!) 137/59  Pulse: (!) 53  (!) 45 (!) 49  Resp: 20  20   Temp: 98.3 F (36.8 C)  98.5 F (36.9 C) 98.1 F (36.7 C)  TempSrc:   Oral Oral  SpO2: 97% 94% 96% 97%  Weight:    (!) 154.6 kg  Height:         Intake/Output Summary (Last 24 hours) at 10/30/2021 0802 Last data filed at 10/30/2021 0459 Gross per 24 hour  Intake 1820 ml  Output 1385 ml  Net 435 ml   Filed Weights   10/28/21 0622 10/29/21 0423 10/30/21 0451  Weight: (!) 154.7 kg (!) 154.1 kg (!) 154.6 kg    Examination:  General exam: Appears uncomfortable. Morbidly obese  Respiratory system: diminished breath sounds b/l otherwise clear   Cardiovascular system: S1 & S2+. No gallops or rubs  Gastrointestinal system: Abd is soft, NT, obese & hypoactive bowel sounds  Central nervous system: alert and oriented. Moves all extremities  Skin: RLE is dressed & dressing is C/D/I  Psychiatry: Judgement and insight appears normal. Flat mood and affect    Data Reviewed: I have personally reviewed following labs and imaging studies  CBC: Recent Labs  Lab 10/26/21 0416 10/27/21 0328 10/28/21 0733 10/29/21 0719 10/30/21 0637  WBC 9.1 6.6 5.6 5.1 9.0  HGB 13.3 12.6* 13.0 12.2* 12.1*  HCT 40.2 37.1* 38.7* 36.5* 36.1*  MCV 88.5 88.3 88.4 88.6 89.8  PLT 215 207 209 235 811   Basic Metabolic Panel: Recent Labs  Lab 10/25/21 0412 10/25/21 1343 10/26/21 0416 10/27/21 0328 10/28/21 0733 10/29/21 0719 10/30/21 0637  NA 134*  --  138 137  --  139 138  K 3.3*   < > 3.5 3.2* 3.5 3.5 3.6  CL 99  --  105 105  --  103 103  CO2 23  --  19* 24  --  28 25  GLUCOSE 155*  --  95 120*  --  118* 121*  BUN 19  --  11 12  --  10 10  CREATININE 1.11  --  0.93 0.84  --  0.88 0.87  CALCIUM 8.0*  --  8.4* 8.2*  --  8.3* 8.2*  MG 2.2  --  2.3 2.1 2.2 2.1 2.1  PHOS 4.2  --  3.0 3.0 3.4 3.9 3.8   < > = values in this interval not displayed.   GFR: Estimated Creatinine Clearance: 128.1 mL/min (by C-G formula based on SCr of 0.87 mg/dL). Liver Function Tests: No results for input(s): AST, ALT, ALKPHOS, BILITOT, PROT, ALBUMIN in the last 168 hours. No results for input(s): LIPASE, AMYLASE in the last 168 hours. No results for input(s):  AMMONIA in the last 168 hours. Coagulation Profile: Recent Labs  Lab 10/24/21 1336  INR 1.1   Cardiac Enzymes: No results for input(s): CKTOTAL, CKMB, CKMBINDEX, TROPONINI in the last 168 hours. BNP (last 3 results) No results for input(s): PROBNP in the last 8760 hours. HbA1C: No results for input(s): HGBA1C in the last 72 hours. CBG: Recent Labs  Lab 10/28/21 2122 10/29/21 0816 10/29/21 1140 10/29/21 1627 10/29/21 2104  GLUCAP 120* 123* 116* 156* 119*   Lipid Profile: No results for input(s): CHOL, HDL, LDLCALC, TRIG, CHOLHDL, LDLDIRECT in the last 72 hours. Thyroid Function Tests: No results for input(s): TSH, T4TOTAL, FREET4, T3FREE, THYROIDAB in the last 72 hours. Anemia Panel: No results for input(s): VITAMINB12, FOLATE, FERRITIN, TIBC, IRON, RETICCTPCT in the last 72 hours. Sepsis Labs: Recent Labs  Lab 10/24/21 1303 10/24/21 1749  LATICACIDVEN 3.9* 1.3    Recent Results (from the past 240 hour(s))  Resp Panel by RT-PCR (Flu A&B, Covid) Nasopharyngeal Swab     Status: None   Collection Time: 10/24/21  1:35 PM   Specimen: Nasopharyngeal Swab; Nasopharyngeal(NP) swabs in vial transport medium  Result Value Ref Range Status   SARS Coronavirus 2 by RT PCR NEGATIVE NEGATIVE Final    Comment: (NOTE) SARS-CoV-2 target nucleic acids are NOT DETECTED.  The SARS-CoV-2 RNA is generally detectable in upper respiratory specimens during the acute phase of infection. The lowest concentration of SARS-CoV-2 viral copies this assay can detect is 138 copies/mL. A negative result does not preclude SARS-Cov-2 infection and should not be used as the sole basis for treatment or other patient management decisions. A negative result may occur with  improper specimen collection/handling, submission of specimen other than nasopharyngeal swab, presence of viral mutation(s) within the areas targeted by this assay, and inadequate number of viral copies(<138 copies/mL). A negative  result must be combined with clinical observations, patient history, and epidemiological information. The expected result is Negative.  Fact Sheet for Patients:  EntrepreneurPulse.com.au  Fact Sheet for Healthcare Providers:  IncredibleEmployment.be  This test is no t yet approved or cleared by the [place] FDA and  has been authorized for detection and/or diagnosis of SARS-CoV-2 by FDA under an Emergency Use Authorization (EUA). This EUA will remain  in effect (meaning this test can be used) for the duration of the COVID-19 declaration under Section 564(b)(1) of the Act, 21 U.S.C.section 360bbb-3(b)(1), unless the authorization is terminated  or revoked sooner.       Influenza A by PCR NEGATIVE NEGATIVE Final   Influenza B by PCR NEGATIVE NEGATIVE Final    Comment: (NOTE) The Xpert Xpress SARS-CoV-2/FLU/RSV plus assay is intended as an aid  in the diagnosis of influenza from Nasopharyngeal swab specimens and should not be used as a sole basis for treatment. Nasal washings and aspirates are unacceptable for Xpert Xpress SARS-CoV-2/FLU/RSV testing.  Fact Sheet for Patients: EntrepreneurPulse.com.au  Fact Sheet for Healthcare Providers: IncredibleEmployment.be  This test is not yet approved or cleared by the [place] FDA and has been authorized for detection and/or diagnosis of SARS-CoV-2 by FDA under an Emergency Use Authorization (EUA). This EUA will remain in effect (meaning this test can be used) for the duration of the COVID-19 declaration under Section 564(b)(1) of the Act, 21 U.S.C. section 360bbb-3(b)(1), unless the authorization is terminated or revoked.  Performed at [facility], [address] [place], [place] [address]   MRSA Next Gen by PCR, Nasal     Status: None   Collection Time: 10/24/21  4:45 PM   Specimen: Nasal Mucosa; Nasal Swab  Result Value Ref Range Status    MRSA by PCR Next Gen NOT DETECTED NOT DETECTED Final    Comment: (NOTE) The GeneXpert MRSA Assay (FDA approved for NASAL specimens only), is one component of a comprehensive MRSA colonization surveillance program. It is not intended to diagnose MRSA infection nor to guide or monitor treatment for MRSA infections. Test performance is not FDA approved in patients less than 57 years old. Performed at [facility], [address] [place], [place] [address]   Blood culture (routine x 2)     Status: None   Collection Time: 10/24/21  5:49 PM   Specimen: BLOOD  Result Value Ref Range Status   Specimen Description BLOOD BOTTLES DRAWN AEROBIC AND ANAEROBIC  Final   Special Requests   Final    Blood Culture results may not be optimal due to an inadequate volume of blood received in culture bottles RIGHT ANTECUBITAL   Culture   Final    NO GROWTH 5 DAYS Performed at [facility], [address] [place], [place] [address]    Report Status 10/29/2021 FINAL  Final  Blood culture (routine x 2)     Status: None   Collection Time: 10/24/21  5:51 PM   Specimen: BLOOD  Result Value Ref Range Status   Specimen Description BLOOD BOTTLES DRAWN AEROBIC AND ANAEROBIC  Final   Special Requests   Final    Blood Culture results may not be optimal due to an inadequate volume of blood received in culture bottles LEFT ANTECUBITAL   Culture   Final    NO GROWTH 5 DAYS Performed at [facility], [address] [place],  [address]    Report Status 10/29/2021 FINAL  Final  Respiratory (~20 pathogens) panel by PCR     Status: Abnormal   Collection Time: 10/24/21 10:30 PM   Specimen: Nasopharyngeal Swab; Respiratory  Result Value Ref Range Status   Adenovirus NOT DETECTED NOT DETECTED Final   Coronavirus 229E NOT DETECTED NOT DETECTED Final    Comment: (NOTE) The Coronavirus on the Respiratory Panel, DOES NOT test for the novel  Coronavirus (2019 nCoV)    Coronavirus  HKU1 NOT DETECTED NOT DETECTED Final   Coronavirus NL63 NOT DETECTED NOT DETECTED Final   Coronavirus OC43 NOT DETECTED NOT DETECTED Final   Metapneumovirus NOT DETECTED NOT DETECTED Final   Rhinovirus / Enterovirus DETECTED (A) NOT DETECTED Final   Influenza A NOT DETECTED NOT DETECTED Final   Influenza B NOT DETECTED NOT DETECTED Final   Parainfluenza Virus 1 NOT DETECTED NOT DETECTED Final  Parainfluenza Virus 2 NOT DETECTED NOT DETECTED Final   Parainfluenza Virus 3 NOT DETECTED NOT DETECTED Final   Parainfluenza Virus 4 NOT DETECTED NOT DETECTED Final   Respiratory Syncytial Virus NOT DETECTED NOT DETECTED Final   Bordetella pertussis NOT DETECTED NOT DETECTED Final   Bordetella Parapertussis NOT DETECTED NOT DETECTED Final   Chlamydophila pneumoniae NOT DETECTED NOT DETECTED Final   Mycoplasma pneumoniae NOT DETECTED NOT DETECTED Final    Comment: Performed at [facility], [place] [address], [place], [place] [address]         Radiology Studies: DG Ankle 2 Views Right  Result Date: 10/29/2021 CLINICAL DATA:  ORIF right ankle. EXAM: RIGHT ANKLE - 2 VIEW COMPARISON:  None Available. FINDINGS: Intraoperative fluoroscopic images for right ankle ORIF. Plate and screw fixation of the distal fibula and a single cancellous screw through the medial malleolus. Total fluoroscopic time was 2 minutes and 55 seconds. Total fluoroscopic dose was 6.321 mGy. IMPRESSION: Intraoperative utilization of fluoroscopy. Electronically Signed   By: Keane Police D.O.   On: 10/29/2021 15:01   DG C-Arm 1-60 Min-No Report  Result Date: 10/29/2021 Fluoroscopy was utilized by the requesting physician.  No radiographic interpretation.   [place] OR NERVE BLOCK-IMAGE ONLY [facility])  Result Date: 10/29/2021 There is no interpretation for this exam.  This order is for images obtained during a surgical procedure.  Please See "Surgeries" Tab for more information regarding the procedure.        Scheduled Meds:   amLODipine  5 mg Oral Daily   aspirin  81 mg Oral Daily   carbamazepine  200 mg Oral BID   chlorhexidine gluconate (MEDLINE KIT)  15 mL Mouth Rinse BID   Chlorhexidine Gluconate Cloth  6 each Topical Q0600   clonazePAM  0.25 mg Oral BID   docusate sodium  100 mg Oral BID   enoxaparin (LOVENOX) injection  0.5 mg/kg Subcutaneous Q24H   famotidine  20 mg Oral Daily   fluticasone  1 spray Each Nare Daily   guaiFENesin  600 mg Oral BID   insulin aspart  0-15 Units Subcutaneous TID WC   ipratropium-albuterol  3 mL Nebulization BID   traZODone  100 mg Oral QHS   venlafaxine XR  150 mg Oral Q breakfast   Continuous Infusions:  sodium chloride 0 mL (10/26/21 1430)   ampicillin-sulbactam (UNASYN) IV 3 g (10/30/21 0515)     LOS: 6 days    Time spent: 25 mins    Wyvonnia Dusky, MD [facility] Pager [phone]  If 7PM-7AM, please contact night-coverage 10/30/2021, 8:02 AM

## 2021-10-31 DIAGNOSIS — I442 Atrioventricular block, complete: Secondary | ICD-10-CM | POA: Diagnosis not present

## 2021-10-31 DIAGNOSIS — S82841A Displaced bimalleolar fracture of right lower leg, initial encounter for closed fracture: Secondary | ICD-10-CM | POA: Diagnosis not present

## 2021-10-31 LAB — BASIC METABOLIC PANEL
Anion gap: 10 (ref 5–15)
BUN: 12 mg/dL (ref 8–23)
CO2: 26 mmol/L (ref 22–32)
Calcium: 8.6 mg/dL — ABNORMAL LOW (ref 8.9–10.3)
Chloride: 102 mmol/L (ref 98–111)
Creatinine, Ser: 0.83 mg/dL (ref 0.61–1.24)
GFR, Estimated: >60 mL/min (ref >60)
Glucose, Bld: 110 mg/dL — ABNORMAL HIGH (ref 70–99)
Potassium: 3.6 mmol/L (ref 3.5–5.1)
Sodium: 138 mmol/L (ref 135–145)

## 2021-10-31 LAB — GLUCOSE, CAPILLARY
Glucose-Capillary: 96 mg/dL (ref 70–99)
Glucose-Capillary: 99 mg/dL (ref 70–99)
Glucose-Capillary: 121 mg/dL — ABNORMAL HIGH (ref 70–99)
Glucose-Capillary: 130 mg/dL — ABNORMAL HIGH (ref 70–99)

## 2021-10-31 LAB — CBC
HCT: 36.3 % — ABNORMAL LOW (ref 39.0–52.0)
Hemoglobin: 12 g/dL — ABNORMAL LOW (ref 13.0–17.0)
MCH: 29.4 pg (ref 26.0–34.0)
MCHC: 33.1 g/dL (ref 30.0–36.0)
MCV: 89 fL (ref 80.0–100.0)
Platelets: 237 10*3/uL (ref 150–400)
RBC: 4.08 MIL/uL — ABNORMAL LOW (ref 4.22–5.81)
RDW: 12.2 % (ref 11.5–15.5)
WBC: 6.9 10*3/uL (ref 4.0–10.5)
nRBC: 0 % (ref 0.0–0.2)

## 2021-10-31 LAB — PHOSPHORUS: Phosphorus: 3.8 mg/dL (ref 2.5–4.6)

## 2021-10-31 LAB — MAGNESIUM: Magnesium: 2.1 mg/dL (ref 1.7–2.4)

## 2021-10-31 MED ORDER — DOCUSATE SODIUM 50 MG/5ML PO LIQD: 100.0000 mg | Freq: Every day | ORAL | Status: DC

## 2021-10-31 MED ORDER — IPRATROPIUM-ALBUTEROL 0.5-2.5 (3) MG/3ML IN SOLN
3.0000 mL | RESPIRATORY_TRACT | Status: DC | PRN
  Administered 2021-11-02 – 2021-11-03 (×2): 3 mL via RESPIRATORY_TRACT
  Filled 2021-10-31 (×2): qty 3

## 2021-10-31 NOTE — Plan of Care (Signed)

## 2021-10-31 NOTE — Progress Notes (Signed)
Physical Therapy Treatment Patient Details Name: Jared Carey MRN: [id number] DOB: Oct 02, 1955 Today's Date: 10/31/2021   History of Present Illness Rulon Freitag is a 24yoM with PMH akathisia, anemia, anxiety, depression, dsyphagia, HTN, MDD, and tardive dyskinesia found unresponsive at his home, bradycardic.  External pacing initiated by EMS after EKG in the field showed complete heart block. Pacing was discontinued in ED after epinephrine gtt started, however, complete heart block recurred and he became hypoxic. Patient was subsequently intubated. Cardiac catheterization performed for coronary angiography (which showed normal coronaries) and temp perm pacemaker was placed and later removed on 10/26/21.  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 Pt is posoperatively TTWB.    PT Comments    Pt was long sitting in bed upon arriving. He is A and O x 4. He endorses feeling better today and was cooperative throughout. Is limited by wt bearing. He did well with adhering to TTWB during OOB activiy but does continue to require extensive assistance for mobility and transfers. Pt would benefit from STR at DC to address deficits prior to returning home.    Recommendations for follow up therapy are one component of a multi-disciplinary discharge planning process, led by the attending physician.  Recommendations may be updated based on patient status, additional functional criteria and insurance authorization.  Follow Up Recommendations  Skilled nursing-short term rehab (<3 hours/day)     Assistance Recommended at Discharge Intermittent  Supervision/Assistance  Patient can return home with the following A lot of help with bathing/dressing/bathroom;Assistance with cooking/housework;Help with stairs or ramp for entrance;Assist for transportation;A lot of help with walking and/or transfers   Equipment Recommendations  None recommended by PT       Precautions / Restrictions Precautions Precautions: Fall Restrictions Weight Bearing Restrictions: Yes RLE Weight Bearing: Touchdown weight bearing Other Position/Activity Restrictions: per smart chat Dr. Rudene Christians 5/19     Mobility  Bed Mobility Overal bed mobility: Needs Assistance Bed Mobility: Supine to Sit  Supine to sit: Supervision   Transfers Overall transfer level: Needs assistance Equipment used: Rolling walker (2 wheels) Transfers: Sit to/from Stand, Bed to chair/wheelchair/BSC Sit to Stand: Min assist, Mod assist, From elevated surface Stand pivot transfers: Mod assist   General transfer comment: Pt can stand from elevated bed height with min assist however from lower surface heights requires mod assist. Does well with wt bearing but fatigued quickly in standing. Was able to safely progress form EOB to recliner "hopping"    Ambulation/Gait    General Gait Details: Limited distance from EOB> recliner due to wt bearing restrictions + pt fatigue after minimal activity.    Balance Overall balance assessment: Needs assistance, History of Falls Sitting-balance support: Bilateral upper extremity supported Sitting balance-Leahy Scale: Good     Standing balance support: During functional activity, Bilateral upper extremity supported, Reliant on assistive device for balance Standing balance-Leahy Scale: Fair       Cognition Arousal/Alertness: Awake/alert Behavior During Therapy: WFL for tasks assessed/performed Overall Cognitive Status: Within Functional Limits for tasks assessed      General Comments: Pt is A and O x 4           General Comments General  comments (skin integrity, edema,  etc.): reviewed importance of performing ther ex to promote circulation nd strength. he demonstarted correct performance      Pertinent Vitals/Pain Pain Assessment Pain Assessment: 0-10 Pain Score: 4  Pain Intervention(s): Limited activity within patient's tolerance, Monitored during session, Premedicated before session, Repositioned     PT Goals (current goals can now be found in the care plan section) Acute Rehab PT Goals Patient Stated Goal: rehab then home Progress towards PT goals: Progressing toward goals    Frequency    7X/week      PT Plan Current plan remains appropriate       AM-PAC PT "6 Clicks" Mobility   Outcome Measure  Help needed turning from your back to your side while in a flat bed without using bedrails?: A Little Help needed moving from lying on your back to sitting on the side of a flat bed without using bedrails?: A Little Help needed moving to and from a bed to a chair (including a wheelchair)?: A Lot Help needed standing up from a chair using your arms (e.g., wheelchair or bedside chair)?: A Lot Help needed to walk in hospital room?: A Lot Help needed climbing 3-5 steps with a railing? : Total 6 Click Score: 13    End of Session   Activity Tolerance: Patient tolerated treatment well;No increased pain Patient left: in chair;with call bell/phone within reach;with chair alarm set Nurse Communication: Mobility status PT Visit Diagnosis: History of falling (Z91.81);Unsteadiness on feet (R26.81);Difficulty in walking, not elsewhere classified (R26.2);Muscle weakness (generalized) (M62.81);Repeated falls (R29.6);Dizziness and giddiness (R42)     Time: [id number] PT Time Calculation (min) (ACUTE ONLY): 16 min  Charges:  $Therapeutic Activity: 8-22 mins                     Julaine Fusi PTA 10/31/21, 12:58 PM

## 2021-10-31 NOTE — Progress Notes (Signed)
   Subjective: 2 Days Post-Op Procedure(s) (LRB): OPEN REDUCTION INTERNAL FIXATION (ORIF) ANKLE FRACTURE (Right) Patient reports pain as mild.   Patient is well, and has had no acute complaints or problems Denies any CP, SOB, ABD pain. We will continue therapy today.   Objective: Vital signs in last 24 hours: Temp:  [98 F (36.7 C)-100.1 F (37.8 C)] 98 F (36.7 C) (05/20 0805) Pulse Rate:  [44-89] 89 (05/20 0805) Resp:  [16-18] 16 (05/20 0805) BP: (134-155)/(61-85) 145/85 (05/20 0805) SpO2:  [94 %-98 %] 98 % (05/20 0805) Weight:  [159 kg] 159 kg (05/20 0447)  Intake/Output from previous day: 05/19 0701 - 05/20 0700 In: 960 [P.O.:960] Out: 2450 [Urine:2450] Intake/Output this shift: No intake/output data recorded.  Recent Labs    10/29/21 0719 10/30/21 0637 10/31/21 0805  HGB 12.2* 12.1* 12.0*   Recent Labs    10/30/21 0637 10/31/21 0805  WBC 9.0 6.9  RBC 4.02* 4.08*  HCT 36.1* 36.3*  PLT 204 237   Recent Labs    10/30/21 0637 10/31/21 0805  NA 138 138  K 3.6 3.6  CL 103 102  CO2 25 26  BUN 10 12  CREATININE 0.87 0.83  GLUCOSE 121* 110*  CALCIUM 8.2* 8.6*   No results for input(s): LABPT, INR in the last 72 hours.  EXAM General - Patient is Alert, Appropriate, and Oriented Extremity - Neurovascular intact Sensation intact distally Intact pulses distally Compartments soft Dressing - dressing C/D/I and no drainage, splint intact Motor Function - intact, moving toes well on exam.   Past Medical History:  Diagnosis Date   ADD (attention deficit disorder)    Akathisia 01/18/2019   Allergy    Anemia    Anxiety    Barrett esophagus    Chest pain 08/07/2016   Colon polyps    Depression    Diverticulosis    Dysphagia    Edema    Gait abnormality 03/09/2021   GERD (gastroesophageal reflux disease)    Hypertension    Incontinence    urge   Major depressive disorder    Obesity    Obesity    Occipital neuralgia 03/09/2021   Bilateral    Osteoarthritis of thumbs, bilateral    Overactive bladder    Renal infarction (HCC)    Tardive akathisia    Tardive dyskinesia    Thoracic stomach hernia    Thyroid disease    Thyroid nodule    benign   Venous insufficiency     Assessment/Plan:   2 Days Post-Op Procedure(s) (LRB): OPEN REDUCTION INTERNAL FIXATION (ORIF) ANKLE FRACTURE (Right) Principal Problem:   Complete heart block ([facility]) Active Problems:   AKI (acute kidney injury) ([facility])   Cardiogenic shock ([facility])   Symptomatic bradycardia  Estimated body mass index is 47.54 kg/m as calculated from the following:   Height as of this encounter: 6' (1.829 m).   Weight as of this encounter: 159 kg. Advance diet Up with therapy. TTWB RLE Pain well controlled VSS Follow up with [facility] next week for dressing change.  DVT Prophylaxis - Lovenox and SCDs    T. Cranston Neighbor, PA-C [facility] 10/31/2021, 10:44 AM

## 2021-10-31 NOTE — Progress Notes (Signed)
PROGRESS NOTE    Jared Carey  PPG:[id number] DOB: Sep 08, 1955 DOA: 10/24/2021 PCP: Beacher May, MD   Assessment & Plan:   Principal Problem:   Complete heart block [facility]) Active Problems:   AKI (acute kidney injury) ([place])   Cardiogenic shock ([place])   Symptomatic bradycardia  Assessment and Plan: Cardiogenic shock: secondary to complete heart block. Was previously taking propranolol. S/p cardiac cath that showed normal coronary arteries, EF 45-50% w/ global hypokinesis. Temporary pacer removed 10/26/21. D/c propranolol. No further inpatient cardiac work-up is needed as per cardio   Acute hypoxic and hypercarbic respiratory failure: s/p intubation, ventilation & extubation. Rhinovirus was positive & possible COPD. Continue on bronchodilators. Resolved    Hypokalemia: WNL    Mood Disorder: unknown severity or type. Continue on venlafaxine & carbamazepine   Right bimalleolar ankle fracture:  with minimal displacement. S/p open reduction internal fixation of right ankle fracture on 10/29/21 as per ortho surg. Percocet, morphine prn for pain    Possible cellulitis of RLE: continue on augmentin   Morbidly obese: BMI 46.2. Complicates overall care & prognosis      DVT prophylaxis: lovenox  Code Status: full  Family Communication:  Disposition Plan: possibly d/c to SNF.  Level of care: Telemetry Cardiac  Status is: Inpatient Remains inpatient appropriate because: needs SNF placement     Consultants:  Cardio Ortho surg   Procedures:   Antimicrobials: augmentin   Subjective: Pt still c/o right ankle pain   Objective: Vitals:   10/31/21 0419 10/31/21 0447 10/31/21 0745 10/31/21 0805  BP: (!) 144/61   (!) 145/85  Pulse: (!) 44  (!) 48 89  Resp: 16  16 16   Temp: 99 F (37.2 C)   98 F (36.7 C)  TempSrc:      SpO2: 94%  98% 98%  Weight:  (!) 159 kg    Height:        Intake/Output Summary (Last 24 hours) at 10/31/2021 0827 Last data filed at 10/31/2021  0400 Gross per 24 hour  Intake 840 ml  Output 2200 ml  Net -1360 ml   Filed Weights   10/29/21 0423 10/30/21 0451 10/31/21 0447  Weight: (!) 154.1 kg (!) 154.6 kg (!) 159 kg    Examination:  General exam: Appears calm but uncomfortable. Morbidly obese Respiratory system: decrease breath sounds b/l Cardiovascular system: S1/S2+. No rubs or gallops  Gastrointestinal system: Abd is soft, NT, obese & hypoactive bowel sounds  Central nervous system: alert and oriented. Moves all extremities  Skin: RLE is dressed & dressing is C/D/I  Psychiatry: judgement and insight appears normal. Flat mood and affect     Data Reviewed: I have personally reviewed following labs and imaging studies  CBC: Recent Labs  Lab 10/26/21 0416 10/27/21 0328 10/28/21 0733 10/29/21 0719 10/30/21 0637  WBC 9.1 6.6 5.6 5.1 9.0  HGB 13.3 12.6* 13.0 12.2* 12.1*  HCT 40.2 37.1* 38.7* 36.5* 36.1*  MCV 88.5 88.3 88.4 88.6 89.8  PLT 215 207 209 235 811   Basic Metabolic Panel: Recent Labs  Lab 10/25/21 0412 10/25/21 1343 10/26/21 0416 10/27/21 0328 10/28/21 0733 10/29/21 0719 10/30/21 0637  NA 134*  --  138 137  --  139 138  K 3.3*   < > 3.5 3.2* 3.5 3.5 3.6  CL 99  --  105 105  --  103 103  CO2 23  --  19* 24  --  28 25  GLUCOSE 155*  --  95 120*  --  118* 121*  BUN 19  --  11 12  --  10 10  CREATININE 1.11  --  0.93 0.84  --  0.88 0.87  CALCIUM 8.0*  --  8.4* 8.2*  --  8.3* 8.2*  MG 2.2  --  2.3 2.1 2.2 2.1 2.1  PHOS 4.2  --  3.0 3.0 3.4 3.9 3.8   < > = values in this interval not displayed.   GFR: Estimated Creatinine Clearance: 130.2 mL/min (by C-G formula based on SCr of 0.87 mg/dL). Liver Function Tests: No results for input(s): AST, ALT, ALKPHOS, BILITOT, PROT, ALBUMIN in the last 168 hours. No results for input(s): LIPASE, AMYLASE in the last 168 hours. No results for input(s): AMMONIA in the last 168 hours. Coagulation Profile: Recent Labs  Lab 10/24/21 1336  INR 1.1    Cardiac Enzymes: No results for input(s): CKTOTAL, CKMB, CKMBINDEX, TROPONINI in the last 168 hours. BNP (last 3 results) No results for input(s): PROBNP in the last 8760 hours. HbA1C: No results for input(s): HGBA1C in the last 72 hours. CBG: Recent Labs  Lab 10/30/21 0920 10/30/21 1114 10/30/21 1612 10/30/21 2245 10/31/21 0805  GLUCAP 130* 110* 133* 147* 96   Lipid Profile: No results for input(s): CHOL, HDL, LDLCALC, TRIG, CHOLHDL, LDLDIRECT in the last 72 hours. Thyroid Function Tests: No results for input(s): TSH, T4TOTAL, FREET4, T3FREE, THYROIDAB in the last 72 hours. Anemia Panel: No results for input(s): VITAMINB12, FOLATE, FERRITIN, TIBC, IRON, RETICCTPCT in the last 72 hours. Sepsis Labs: Recent Labs  Lab 10/24/21 1303 10/24/21 1749  LATICACIDVEN 3.9* 1.3    Recent Results (from the past 240 hour(s))  Resp Panel by RT-PCR (Flu A&B, Covid) Nasopharyngeal Swab     Status: None   Collection Time: 10/24/21  1:35 PM   Specimen: Nasopharyngeal Swab; Nasopharyngeal(NP) swabs in vial transport medium  Result Value Ref Range Status   SARS Coronavirus 2 by RT PCR NEGATIVE NEGATIVE Final    Comment: (NOTE) SARS-CoV-2 target nucleic acids are NOT DETECTED.  The SARS-CoV-2 RNA is generally detectable in upper respiratory specimens during the acute phase of infection. The lowest concentration of SARS-CoV-2 viral copies this assay can detect is 138 copies/mL. A negative result does not preclude SARS-Cov-2 infection and should not be used as the sole basis for treatment or other patient management decisions. A negative result may occur with  improper specimen collection/handling, submission of specimen other than nasopharyngeal swab, presence of viral mutation(s) within the areas targeted by this assay, and inadequate number of viral copies(<138 copies/mL). A negative result must be combined with clinical observations, patient history, and epidemiological information.  The expected result is Negative.  Fact Sheet for Patients:  EntrepreneurPulse.com.au  Fact Sheet for Healthcare Providers:  IncredibleEmployment.be  This test is no t yet approved or cleared by the [place] FDA and  has been authorized for detection and/or diagnosis of SARS-CoV-2 by FDA under an Emergency Use Authorization (EUA). This EUA will remain  in effect (meaning this test can be used) for the duration of the COVID-19 declaration under Section 564(b)(1) of the Act, 21 U.S.C.section 360bbb-3(b)(1), unless the authorization is terminated  or revoked sooner.       Influenza A by PCR NEGATIVE NEGATIVE Final   Influenza B by PCR NEGATIVE NEGATIVE Final    Comment: (NOTE) The Xpert Xpress SARS-CoV-2/FLU/RSV plus assay is intended as an aid in the diagnosis of influenza from Nasopharyngeal swab specimens and should not be used as a sole basis  for treatment. Nasal washings and aspirates are unacceptable for Xpert Xpress SARS-CoV-2/FLU/RSV testing.  Fact Sheet for Patients: EntrepreneurPulse.com.au  Fact Sheet for Healthcare Providers: IncredibleEmployment.be  This test is not yet approved or cleared by the [place] FDA and has been authorized for detection and/or diagnosis of SARS-CoV-2 by FDA under an Emergency Use Authorization (EUA). This EUA will remain in effect (meaning this test can be used) for the duration of the COVID-19 declaration under Section 564(b)(1) of the Act, 21 U.S.C. section 360bbb-3(b)(1), unless the authorization is terminated or revoked.  Performed at [facility], [address] [place], [place] [address]   MRSA Next Gen by PCR, Nasal     Status: None   Collection Time: 10/24/21  4:45 PM   Specimen: Nasal Mucosa; Nasal Swab  Result Value Ref Range Status   MRSA by PCR Next Gen NOT DETECTED NOT DETECTED Final    Comment: (NOTE) The GeneXpert MRSA Assay  (FDA approved for NASAL specimens only), is one component of a comprehensive MRSA colonization surveillance program. It is not intended to diagnose MRSA infection nor to guide or monitor treatment for MRSA infections. Test performance is not FDA approved in patients less than 50 years old. Performed at [facility], [address] [place], [place] [address]   Blood culture (routine x 2)     Status: None   Collection Time: 10/24/21  5:49 PM   Specimen: BLOOD  Result Value Ref Range Status   Specimen Description BLOOD BOTTLES DRAWN AEROBIC AND ANAEROBIC  Final   Special Requests   Final    Blood Culture results may not be optimal due to an inadequate volume of blood received in culture bottles RIGHT ANTECUBITAL   Culture   Final    NO GROWTH 5 DAYS Performed at [facility], [address] [place], [place] [address]    Report Status 10/29/2021 FINAL  Final  Blood culture (routine x 2)     Status: None   Collection Time: 10/24/21  5:51 PM   Specimen: BLOOD  Result Value Ref Range Status   Specimen Description BLOOD BOTTLES DRAWN AEROBIC AND ANAEROBIC  Final   Special Requests   Final    Blood Culture results may not be optimal due to an inadequate volume of blood received in culture bottles LEFT ANTECUBITAL   Culture   Final    NO GROWTH 5 DAYS Performed at [facility], [address] [place], [place] [address]    Report Status 10/29/2021 FINAL  Final  Respiratory (~20 pathogens) panel by PCR     Status: Abnormal   Collection Time: 10/24/21 10:30 PM   Specimen: Nasopharyngeal Swab; Respiratory  Result Value Ref Range Status   Adenovirus NOT DETECTED NOT DETECTED Final   Coronavirus 229E NOT DETECTED NOT DETECTED Final    Comment: (NOTE) The Coronavirus on the Respiratory Panel, DOES NOT test for the novel  Coronavirus (2019 nCoV)    Coronavirus HKU1 NOT DETECTED NOT DETECTED Final   Coronavirus NL63 NOT DETECTED NOT DETECTED Final    Coronavirus OC43 NOT DETECTED NOT DETECTED Final   Metapneumovirus NOT DETECTED NOT DETECTED Final   Rhinovirus / Enterovirus DETECTED (A) NOT DETECTED Final   Influenza A NOT DETECTED NOT DETECTED Final   Influenza B NOT DETECTED NOT DETECTED Final   Parainfluenza Virus 1 NOT DETECTED NOT DETECTED Final   Parainfluenza Virus 2 NOT DETECTED NOT DETECTED Final   Parainfluenza Virus 3 NOT DETECTED NOT DETECTED Final  Parainfluenza Virus 4 NOT DETECTED NOT DETECTED Final   Respiratory Syncytial Virus NOT DETECTED NOT DETECTED Final   Bordetella pertussis NOT DETECTED NOT DETECTED Final   Bordetella Parapertussis NOT DETECTED NOT DETECTED Final   Chlamydophila pneumoniae NOT DETECTED NOT DETECTED Final   Mycoplasma pneumoniae NOT DETECTED NOT DETECTED Final    Comment: Performed at [facility], [place] [address], [place], [place] [address]         Radiology Studies: DG Ankle 2 Views Right  Result Date: 10/29/2021 CLINICAL DATA:  ORIF right ankle. EXAM: RIGHT ANKLE - 2 VIEW COMPARISON:  None Available. FINDINGS: Intraoperative fluoroscopic images for right ankle ORIF. Plate and screw fixation of the distal fibula and a single cancellous screw through the medial malleolus. Total fluoroscopic time was 2 minutes and 55 seconds. Total fluoroscopic dose was 6.321 mGy. IMPRESSION: Intraoperative utilization of fluoroscopy. Electronically Signed   By: Keane Police D.O.   On: 10/29/2021 15:01   DG C-Arm 1-60 Min-No Report  Result Date: 10/29/2021 Fluoroscopy was utilized by the requesting physician.  No radiographic interpretation.   [place] OR NERVE BLOCK-IMAGE ONLY [facility])  Result Date: 10/29/2021 There is no interpretation for this exam.  This order is for images obtained during a surgical procedure.  Please See "Surgeries" Tab for more information regarding the procedure.        Scheduled Meds:  amLODipine  5 mg Oral Daily   amoxicillin-clavulanate  1 tablet Oral Q12H   aspirin  81 mg  Oral Daily   carbamazepine  200 mg Oral BID   chlorhexidine gluconate (MEDLINE KIT)  15 mL Mouth Rinse BID   Chlorhexidine Gluconate Cloth  6 each Topical Q0600   clonazePAM  0.25 mg Oral BID   docusate sodium  100 mg Oral BID   enoxaparin (LOVENOX) injection  0.5 mg/kg Subcutaneous Q24H   famotidine  20 mg Oral Daily   fluticasone  1 spray Each Nare Daily   guaiFENesin  600 mg Oral BID   insulin aspart  0-15 Units Subcutaneous TID WC   ipratropium-albuterol  3 mL Nebulization BID   traZODone  100 mg Oral QHS   venlafaxine XR  150 mg Oral Q breakfast   Continuous Infusions:  sodium chloride 0 mL (10/26/21 1430)     LOS: 7 days    Time spent: 27 mins    Wyvonnia Dusky, MD [facility] Pager [phone]  If 7PM-7AM, please contact night-coverage 10/31/2021, 8:27 AM

## 2021-11-01 DIAGNOSIS — I442 Atrioventricular block, complete: Secondary | ICD-10-CM | POA: Diagnosis not present

## 2021-11-01 DIAGNOSIS — S82841A Displaced bimalleolar fracture of right lower leg, initial encounter for closed fracture: Secondary | ICD-10-CM | POA: Diagnosis not present

## 2021-11-01 LAB — BASIC METABOLIC PANEL
Anion gap: 6 (ref 5–15)
BUN: 13 mg/dL (ref 8–23)
CO2: 29 mmol/L (ref 22–32)
Calcium: 8.6 mg/dL — ABNORMAL LOW (ref 8.9–10.3)
Chloride: 105 mmol/L (ref 98–111)
Creatinine, Ser: 0.83 mg/dL (ref 0.61–1.24)
GFR, Estimated: >60 mL/min (ref >60)
Glucose, Bld: 107 mg/dL — ABNORMAL HIGH (ref 70–99)
Potassium: 3.7 mmol/L (ref 3.5–5.1)
Sodium: 140 mmol/L (ref 135–145)

## 2021-11-01 LAB — GLUCOSE, CAPILLARY
Glucose-Capillary: 113 mg/dL — ABNORMAL HIGH (ref 70–99)
Glucose-Capillary: 136 mg/dL — ABNORMAL HIGH (ref 70–99)
Glucose-Capillary: 90 mg/dL (ref 70–99)
Glucose-Capillary: 108 mg/dL — ABNORMAL HIGH (ref 70–99)

## 2021-11-01 LAB — CBC
HCT: 37.2 % — ABNORMAL LOW (ref 39.0–52.0)
Hemoglobin: 12.3 g/dL — ABNORMAL LOW (ref 13.0–17.0)
MCH: 29.6 pg (ref 26.0–34.0)
MCHC: 33.1 g/dL (ref 30.0–36.0)
MCV: 89.4 fL (ref 80.0–100.0)
Platelets: 248 10*3/uL (ref 150–400)
RBC: 4.16 MIL/uL — ABNORMAL LOW (ref 4.22–5.81)
RDW: 12.2 % (ref 11.5–15.5)
WBC: 6.1 10*3/uL (ref 4.0–10.5)
nRBC: 0 % (ref 0.0–0.2)

## 2021-11-01 LAB — PHOSPHORUS: Phosphorus: 4.4 mg/dL (ref 2.5–4.6)

## 2021-11-01 LAB — MAGNESIUM: Magnesium: 2.2 mg/dL (ref 1.7–2.4)

## 2021-11-01 MED ORDER — OXYBUTYNIN CHLORIDE 5 MG PO TABS
20.0000 mg | ORAL_TABLET | Freq: Two times a day (BID) | ORAL | Status: DC
  Administered 2021-11-01 – 2021-11-03 (×5): 20 mg via ORAL
  Filled 2021-11-01 (×5): qty 4

## 2021-11-01 MED ORDER — LACTULOSE 10 GM/15ML PO SOLN
20.0000 g | Freq: Two times a day (BID) | ORAL | Status: DC
  Administered 2021-11-01: 20 g via ORAL
  Filled 2021-11-01: qty 30

## 2021-11-01 NOTE — Progress Notes (Signed)
Physical Therapy Treatment Patient Details Name: Jared Carey MRN: [id number] DOB: [birth year] Today's Date: 11/01/2021   History of Present Illness Jared Carey is a 50yoM with PMH akathisia, anemia, anxiety, depression, dsyphagia, HTN, MDD, and tardive dyskinesia found unresponsive at his home, bradycardic.  External pacing initiated by EMS after EKG in the field showed complete heart block. Pacing was discontinued in ED after epinephrine gtt started, however, complete heart block recurred and he became hypoxic. Patient was subsequently intubated. Cardiac catheterization performed for coronary angiography (which showed normal coronaries) and temp perm pacemaker was placed and later removed on 10/26/21.  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 Pt is posoperatively TTWB.    PT Comments    Pt in bed.  Stated he was given an laxative and needs to use BSC.  Bed mobility with supervision.  Able to stand from elevated bed height with min a x 1 and transfer with RW with ttwb to [facility].  Asks for extended time on BSC.  Call bell in reach and pt aware not to get up without assist.   Recommendations for follow up therapy are one component of a multi-disciplinary discharge planning process, led by the attending physician.  Recommendations may be updated based on patient status, additional functional criteria and insurance authorization.  Follow Up Recommendations  Skilled nursing-short term rehab (<3 hours/day)     Assistance Recommended at Discharge Intermittent Supervision/Assistance  Patient can return home with the following A lot  of help with bathing/dressing/bathroom;Assistance with cooking/housework;Help with stairs or ramp for entrance;Assist for transportation;A lot of help with walking and/or transfers   Equipment Recommendations  None recommended by PT    Recommendations for Other Services       Precautions / Restrictions Precautions Precautions: Fall Restrictions Weight Bearing Restrictions: Yes RLE Weight Bearing: Touchdown weight bearing Other Position/Activity Restrictions: per smart chat Dr. Rosita Kea 5/19     Mobility  Bed Mobility Overal bed mobility: Needs Assistance Bed Mobility: Supine to Sit     Supine to sit: Supervision          Transfers Overall transfer level: Needs assistance Equipment used: Rolling walker (2 wheels)     Stand pivot transfers: Mod assist         General transfer comment: elevated bed height to stand to transfer to commode with RW and mod a x 1 with cga from tech.  appears to maintain ttwb well.    Ambulation/Gait Ambulation/Gait assistance: Min assist Gait Distance (Feet): 2 Feet Assistive device: Rolling walker (2 wheels)   Gait velocity: decreased     General Gait Details: hop/pivot to [facility]   Stairs             Wheelchair Mobility    Modified Rankin (Stroke Patients Only)       Balance Overall balance assessment: Needs assistance, History of Falls Sitting-balance support: Bilateral upper extremity supported Sitting balance-Leahy Scale: Good     Standing balance support: During functional activity, Bilateral upper extremity supported, Reliant on assistive device for balance Standing balance-Leahy Scale: Fair  Cognition Arousal/Alertness: Awake/alert Behavior During Therapy: WFL for tasks assessed/performed Overall Cognitive Status: Within Functional Limits for tasks assessed                                          Exercises      General Comments         Pertinent Vitals/Pain Pain Assessment Pain Assessment: Faces Faces Pain Scale: Hurts a little bit Pain Location: Rt heel throughout Pain Descriptors / Indicators: Aching, Discomfort Pain Intervention(s): Limited activity within patient's tolerance, Monitored during session, Repositioned    Home Living                          Prior Function            PT Goals (current goals can now be found in the care plan section) Progress towards PT goals: Progressing toward goals    Frequency    7X/week      PT Plan Current plan remains appropriate    Co-evaluation              AM-PAC PT "6 Clicks" Mobility   Outcome Measure  Help needed turning from your back to your side while in a flat bed without using bedrails?: A Little Help needed moving from lying on your back to sitting on the side of a flat bed without using bedrails?: A Little Help needed moving to and from a bed to a chair (including a wheelchair)?: A Lot Help needed standing up from a chair using your arms (e.g., wheelchair or bedside chair)?: A Lot Help needed to walk in hospital room?: A Lot Help needed climbing 3-5 steps with a railing? : Total 6 Click Score: 13    End of Session   Activity Tolerance: Patient tolerated treatment well;No increased pain Patient left: in chair;with call bell/phone within reach;with chair alarm set Nurse Communication: Mobility status PT Visit Diagnosis: History of falling (Z91.81);Unsteadiness on feet (R26.81);Difficulty in walking, not elsewhere classified (R26.2);Muscle weakness (generalized) (M62.81);Repeated falls (R29.6);Dizziness and giddiness (R42)     Time: 3419-3790 PT Time Calculation (min) (ACUTE ONLY): 10 min  Charges:  $Therapeutic Activity: 8-22 mins                   Danielle Dess, PTA 11/01/21, 11:50 AM

## 2021-11-01 NOTE — Progress Notes (Signed)
   Subjective: 3 Days Post-Op Procedure(s) (LRB): OPEN REDUCTION INTERNAL FIXATION (ORIF) ANKLE FRACTURE (Right) Patient reports pain as mild.   Patient is well, and has had no acute complaints or problems Denies any CP, SOB, ABD pain. We will continue therapy today.   Objective: Vital signs in last 24 hours: Temp:  [97.9 F (36.6 C)-98.5 F (36.9 C)] 98 F (36.7 C) (05/21 0811) Pulse Rate:  [71-85] 71 (05/21 0811) Resp:  [14-18] 18 (05/21 0811) BP: (135-155)/(69-83) 135/82 (05/21 0811) SpO2:  [95 %-100 %] 97 % (05/21 0811) Weight:  [156.2 kg] 156.2 kg (05/21 0522)  Intake/Output from previous day: 05/20 0701 - 05/21 0700 In: 480 [P.O.:480] Out: 2625 [Urine:2625] Intake/Output this shift: Total I/O In: -  Out: 550 [Urine:550]  Recent Labs    10/30/21 0637 10/31/21 0805 11/01/21 0419  HGB 12.1* 12.0* 12.3*   Recent Labs    10/31/21 0805 11/01/21 0419  WBC 6.9 6.1  RBC 4.08* 4.16*  HCT 36.3* 37.2*  PLT 237 248   Recent Labs    10/31/21 0805 11/01/21 0419  NA 138 140  K 3.6 3.7  CL 102 105  CO2 26 29  BUN 12 13  CREATININE 0.83 0.83  GLUCOSE 110* 107*  CALCIUM 8.6* 8.6*   No results for input(s): LABPT, INR in the last 72 hours.  EXAM General - Patient is Alert, Appropriate, and Oriented Extremity - Neurovascular intact Sensation intact distally Intact pulses distally Compartments soft Dressing - dressing C/D/I and no drainage, splint intact Motor Function - intact, moving toes well on exam.   Past Medical History:  Diagnosis Date   ADD (attention deficit disorder)    Akathisia 01/18/2019   Allergy    Anemia    Anxiety    Barrett esophagus    Chest pain 08/07/2016   Colon polyps    Depression    Diverticulosis    Dysphagia    Edema    Gait abnormality 03/09/2021   GERD (gastroesophageal reflux disease)    Hypertension    Incontinence    urge   Major depressive disorder    Obesity    Obesity    Occipital neuralgia 03/09/2021    Bilateral   Osteoarthritis of thumbs, bilateral    Overactive bladder    Renal infarction (HCC)    Tardive akathisia    Tardive dyskinesia    Thoracic stomach hernia    Thyroid disease    Thyroid nodule    benign   Venous insufficiency     Assessment/Plan:   3 Days Post-Op Procedure(s) (LRB): OPEN REDUCTION INTERNAL FIXATION (ORIF) ANKLE FRACTURE (Right) Principal Problem:   Complete heart block ([facility]) Active Problems:   AKI (acute kidney injury) ([facility])   Cardiogenic shock ([facility])   Symptomatic bradycardia  Estimated body mass index is 46.71 kg/m as calculated from the following:   Height as of this encounter: 6' (1.829 m).   Weight as of this encounter: 156.2 kg. Advance diet Up with therapy. TTWB RLE Pain well controlled VSS Will place patient into a short leg cast tomorrow. Patient can follow up 2 weeks post op (11/12/2021) in the clinic for cast and staple removal.  DVT Prophylaxis - Lovenox and SCDs    T. Cranston Neighbor, PA-C [facility] 11/01/2021, 10:43 AM

## 2021-11-01 NOTE — Progress Notes (Signed)
PROGRESS NOTE    Jared Carey  QMV:[id number] DOB: Oct 13, 1955 DOA: 10/24/2021 PCP: Beacher May, MD   Assessment & Plan:   Principal Problem:   Complete heart block [facility]) Active Problems:   AKI (acute kidney injury) ([place])   Cardiogenic shock ([place])   Symptomatic bradycardia  Assessment and Plan: Cardiogenic shock: secondary to complete heart block. Was previously taking propranolol. S/p cardiac cath that showed normal coronary arteries, EF 45-50% w/ global hypokinesis. Temporary pacer removed 10/26/21. D/c propranolol. No further inpatient cardiac work-up is needed as per cardio   Acute hypoxic and hypercarbic respiratory failure: s/p intubation, ventilation & extubation. Rhinovirus was positive & possible COPD. Continue on bronchodilators. Resolved    Hypokalemia: WNL today  Mood Disorder: unknown severity or type. Continue on carbamazepine, venlafaxine   Right bimalleolar ankle fracture:  with minimal displacement. S/p open reduction internal fixation of right ankle fracture on 10/29/21 as per ortho surg. Percocet, morphine prn for pain    Possible cellulitis of RLE: continue on augmentin   Morbidly obese: BMI 46.7. Complicates overall care & prognosis   Constipation: lactulose ordered    DVT prophylaxis: lovenox  Code Status: full  Family Communication:  Disposition Plan: possibly d/c to SNF.  Level of care: Telemetry Cardiac  Status is: Inpatient Remains inpatient appropriate because: needs SNF placement     Consultants:  Cardio Ortho surg   Procedures:   Antimicrobials: augmentin   Subjective: Pt c/o constipation   Objective: Vitals:   10/31/21 1551 10/31/21 1943 11/01/21 0005 11/01/21 0522  BP: 140/69 (!) 153/78 (!) 155/76 (!) 151/83  Pulse: 80 78 85 76  Resp: _0 Temp: 98.2 F (36.8 C) 98.3 F (36.8 C) 97.9 F (36.6 C) 98.3 F (36.8 C)  TempSrc:  Oral    SpO2: 99% 100% 95% 96%  Weight:    (!) 156.2 kg  Height:    6' (1.829 m)     Intake/Output Summary (Last 24 hours) at 11/01/2021 0746 Last data filed at 11/01/2021 2841 Gross per 24 hour  Intake 480 ml  Output 2350 ml  Net -1870 ml   Filed Weights   10/30/21 0451 10/31/21 0447 11/01/21 0522  Weight: (!) 154.6 kg (!) 159 kg (!) 156.2 kg    Examination:  General exam: Appears comfortable. Morbidly obese Respiratory system: diminished breath sounds b/l  Cardiovascular system: S1&S2+. No rubs or gallops  Gastrointestinal system: Abd is soft, NT, obese & hypoactive bowel sounds  Central nervous system: Alert and oriented. Moves all extremities  Skin: RLE is dressed & dressing is C/D/I  Psychiatry: Judgement and insight appears normal. Appropriate mood and affect     Data Reviewed: I have personally reviewed following labs and imaging studies  CBC: Recent Labs  Lab 10/28/21 0733 10/29/21 0719 10/30/21 0637 10/31/21 0805 11/01/21 0419  WBC 5.6 5.1 9.0 6.9 6.1  HGB 13.0 12.2* 12.1* 12.0* 12.3*  HCT 38.7* 36.5* 36.1* 36.3* 37.2*  MCV 88.4 88.6 89.8 89.0 89.4  PLT 209 235 204 237 324   Basic Metabolic Panel: Recent Labs  Lab 10/27/21 0328 10/28/21 0733 10/29/21 0719 10/30/21 0637 10/31/21 0805 11/01/21 0419  NA 137  --  139 138 138 140  K 3.2* 3.5 3.5 3.6 3.6 3.7  CL 105  --  103 103 102 105  CO2 24  --  _1 GLUCOSE 120*  --  118* 121* 110* 107*  BUN 12  --  _2 13  CREATININE 0.84  --  0.88 0.87 0.83 0.83  CALCIUM 8.2*  --  8.3* 8.2* 8.6* 8.6*  MG 2.1 2.2 2.1 2.1 2.1 2.2  PHOS 3.0 3.4 3.9 3.8 3.8 4.4   GFR: Estimated Creatinine Clearance: 135 mL/min (by C-G formula based on SCr of 0.83 mg/dL). Liver Function Tests: No results for input(s): AST, ALT, ALKPHOS, BILITOT, PROT, ALBUMIN in the last 168 hours. No results for input(s): LIPASE, AMYLASE in the last 168 hours. No results for input(s): AMMONIA in the last 168 hours. Coagulation Profile: No results for input(s): INR, PROTIME in the last 168 hours.  Cardiac  Enzymes: No results for input(s): CKTOTAL, CKMB, CKMBINDEX, TROPONINI in the last 168 hours. BNP (last 3 results) No results for input(s): PROBNP in the last 8760 hours. HbA1C: No results for input(s): HGBA1C in the last 72 hours. CBG: Recent Labs  Lab 10/30/21 2245 10/31/21 0805 10/31/21 1153 10/31/21 1553 10/31/21 2112  GLUCAP 147* 96 99 121* 130*   Lipid Profile: No results for input(s): CHOL, HDL, LDLCALC, TRIG, CHOLHDL, LDLDIRECT in the last 72 hours. Thyroid Function Tests: No results for input(s): TSH, T4TOTAL, FREET4, T3FREE, THYROIDAB in the last 72 hours. Anemia Panel: No results for input(s): VITAMINB12, FOLATE, FERRITIN, TIBC, IRON, RETICCTPCT in the last 72 hours. Sepsis Labs: No results for input(s): PROCALCITON, LATICACIDVEN in the last 168 hours.   Recent Results (from the past 240 hour(s))  Resp Panel by RT-PCR (Flu A&B, Covid) Nasopharyngeal Swab     Status: None   Collection Time: 10/24/21  1:35 PM   Specimen: Nasopharyngeal Swab; Nasopharyngeal(NP) swabs in vial transport medium  Result Value Ref Range Status   SARS Coronavirus 2 by RT PCR NEGATIVE NEGATIVE Final    Comment: (NOTE) SARS-CoV-2 target nucleic acids are NOT DETECTED.  The SARS-CoV-2 RNA is generally detectable in upper respiratory specimens during the acute phase of infection. The lowest concentration of SARS-CoV-2 viral copies this assay can detect is 138 copies/mL. A negative result does not preclude SARS-Cov-2 infection and should not be used as the sole basis for treatment or other patient management decisions. A negative result may occur with  improper specimen collection/handling, submission of specimen other than nasopharyngeal swab, presence of viral mutation(s) within the areas targeted by this assay, and inadequate number of viral copies(<138 copies/mL). A negative result must be combined with clinical observations, patient history, and epidemiological information. The expected  result is Negative.  Fact Sheet for Patients:  EntrepreneurPulse.com.au  Fact Sheet for Healthcare Providers:  IncredibleEmployment.be  This test is no t yet approved or cleared by the [place] FDA and  has been authorized for detection and/or diagnosis of SARS-CoV-2 by FDA under an Emergency Use Authorization (EUA). This EUA will remain  in effect (meaning this test can be used) for the duration of the COVID-19 declaration under Section 564(b)(1) of the Act, 21 U.S.C.section 360bbb-3(b)(1), unless the authorization is terminated  or revoked sooner.       Influenza A by PCR NEGATIVE NEGATIVE Final   Influenza B by PCR NEGATIVE NEGATIVE Final    Comment: (NOTE) The Xpert Xpress SARS-CoV-2/FLU/RSV plus assay is intended as an aid in the diagnosis of influenza from Nasopharyngeal swab specimens and should not be used as a sole basis for treatment. Nasal washings and aspirates are unacceptable for Xpert Xpress SARS-CoV-2/FLU/RSV testing.  Fact Sheet for Patients: EntrepreneurPulse.com.au  Fact Sheet for Healthcare Providers: IncredibleEmployment.be  This test is not yet approved or cleared by the [place] FDA  and has been authorized for detection and/or diagnosis of SARS-CoV-2 by FDA under an Emergency Use Authorization (EUA). This EUA will remain in effect (meaning this test can be used) for the duration of the COVID-19 declaration under Section 564(b)(1) of the Act, 21 U.S.C. section 360bbb-3(b)(1), unless the authorization is terminated or revoked.  Performed at [facility], [address] [place], [place] [address]   MRSA Next Gen by PCR, Nasal     Status: None   Collection Time: 10/24/21  4:45 PM   Specimen: Nasal Mucosa; Nasal Swab  Result Value Ref Range Status   MRSA by PCR Next Gen NOT DETECTED NOT DETECTED Final    Comment: (NOTE) The GeneXpert MRSA Assay (FDA approved  for NASAL specimens only), is one component of a comprehensive MRSA colonization surveillance program. It is not intended to diagnose MRSA infection nor to guide or monitor treatment for MRSA infections. Test performance is not FDA approved in patients less than 19 years old. Performed at [facility], [address] [place], [place] [address]   Blood culture (routine x 2)     Status: None   Collection Time: 10/24/21  5:49 PM   Specimen: BLOOD  Result Value Ref Range Status   Specimen Description BLOOD BOTTLES DRAWN AEROBIC AND ANAEROBIC  Final   Special Requests   Final    Blood Culture results may not be optimal due to an inadequate volume of blood received in culture bottles RIGHT ANTECUBITAL   Culture   Final    NO GROWTH 5 DAYS Performed at [facility], [address] [place], [place] [address]    Report Status 10/29/2021 FINAL  Final  Blood culture (routine x 2)     Status: None   Collection Time: 10/24/21  5:51 PM   Specimen: BLOOD  Result Value Ref Range Status   Specimen Description BLOOD BOTTLES DRAWN AEROBIC AND ANAEROBIC  Final   Special Requests   Final    Blood Culture results may not be optimal due to an inadequate volume of blood received in culture bottles LEFT ANTECUBITAL   Culture   Final    NO GROWTH 5 DAYS Performed at [facility], [address] [place], [place] [address]    Report Status 10/29/2021 FINAL  Final  Respiratory (~20 pathogens) panel by PCR     Status: Abnormal   Collection Time: 10/24/21 10:30 PM   Specimen: Nasopharyngeal Swab; Respiratory  Result Value Ref Range Status   Adenovirus NOT DETECTED NOT DETECTED Final   Coronavirus 229E NOT DETECTED NOT DETECTED Final    Comment: (NOTE) The Coronavirus on the Respiratory Panel, DOES NOT test for the novel  Coronavirus (2019 nCoV)    Coronavirus HKU1 NOT DETECTED NOT DETECTED Final   Coronavirus NL63 NOT DETECTED NOT DETECTED Final   Coronavirus OC43 NOT  DETECTED NOT DETECTED Final   Metapneumovirus NOT DETECTED NOT DETECTED Final   Rhinovirus / Enterovirus DETECTED (A) NOT DETECTED Final   Influenza A NOT DETECTED NOT DETECTED Final   Influenza B NOT DETECTED NOT DETECTED Final   Parainfluenza Virus 1 NOT DETECTED NOT DETECTED Final   Parainfluenza Virus 2 NOT DETECTED NOT DETECTED Final   Parainfluenza Virus 3 NOT DETECTED NOT DETECTED Final   Parainfluenza Virus 4 NOT DETECTED NOT DETECTED Final   Respiratory Syncytial Virus NOT DETECTED NOT DETECTED Final   Bordetella pertussis NOT DETECTED NOT DETECTED Final   Bordetella Parapertussis NOT DETECTED NOT DETECTED Final  Chlamydophila pneumoniae NOT DETECTED NOT DETECTED Final   Mycoplasma pneumoniae NOT DETECTED NOT DETECTED Final    Comment: Performed at [facility], [place] [address], [place], [place] [address]         Radiology Studies: No results found.      Scheduled Meds:  amLODipine  5 mg Oral Daily   amoxicillin-clavulanate  1 tablet Oral Q12H   aspirin  81 mg Oral Daily   carbamazepine  200 mg Oral BID   chlorhexidine gluconate (MEDLINE KIT)  15 mL Mouth Rinse BID   Chlorhexidine Gluconate Cloth  6 each Topical Q0600   clonazePAM  0.25 mg Oral BID   docusate sodium  100 mg Oral BID   enoxaparin (LOVENOX) injection  0.5 mg/kg Subcutaneous Q24H   famotidine  20 mg Oral Daily   fluticasone  1 spray Each Nare Daily   guaiFENesin  600 mg Oral BID   insulin aspart  0-15 Units Subcutaneous TID WC   traZODone  100 mg Oral QHS   venlafaxine XR  150 mg Oral Q breakfast   Continuous Infusions:  sodium chloride 0 mL (10/26/21 1430)     LOS: 8 days    Time spent: 25 mins    Wyvonnia Dusky, MD [facility] Pager [phone]  If 7PM-7AM, please contact night-coverage 11/01/2021, 7:46 AM

## 2021-11-01 NOTE — Plan of Care (Signed)

## 2021-11-02 DIAGNOSIS — S82841A Displaced bimalleolar fracture of right lower leg, initial encounter for closed fracture: Secondary | ICD-10-CM | POA: Diagnosis not present

## 2021-11-02 DIAGNOSIS — I442 Atrioventricular block, complete: Secondary | ICD-10-CM | POA: Diagnosis not present

## 2021-11-02 LAB — BASIC METABOLIC PANEL
Anion gap: 8 (ref 5–15)
BUN: 12 mg/dL (ref 8–23)
CO2: 29 mmol/L (ref 22–32)
Calcium: 8.9 mg/dL (ref 8.9–10.3)
Chloride: 103 mmol/L (ref 98–111)
Creatinine, Ser: 0.84 mg/dL (ref 0.61–1.24)
GFR, Estimated: >60 mL/min (ref >60)
Glucose, Bld: 101 mg/dL — ABNORMAL HIGH (ref 70–99)
Potassium: 3.6 mmol/L (ref 3.5–5.1)
Sodium: 140 mmol/L (ref 135–145)

## 2021-11-02 LAB — GLUCOSE, CAPILLARY
Glucose-Capillary: 128 mg/dL — ABNORMAL HIGH (ref 70–99)
Glucose-Capillary: 172 mg/dL — ABNORMAL HIGH (ref 70–99)
Glucose-Capillary: 127 mg/dL — ABNORMAL HIGH (ref 70–99)
Glucose-Capillary: 138 mg/dL — ABNORMAL HIGH (ref 70–99)

## 2021-11-02 LAB — PHOSPHORUS: Phosphorus: 4.7 mg/dL — ABNORMAL HIGH (ref 2.5–4.6)

## 2021-11-02 LAB — CBC
HCT: 37.4 % — ABNORMAL LOW (ref 39.0–52.0)
Hemoglobin: 12.2 g/dL — ABNORMAL LOW (ref 13.0–17.0)
MCH: 29.6 pg (ref 26.0–34.0)
MCHC: 32.6 g/dL (ref 30.0–36.0)
MCV: 90.8 fL (ref 80.0–100.0)
Platelets: 291 10*3/uL (ref 150–400)
RBC: 4.12 MIL/uL — ABNORMAL LOW (ref 4.22–5.81)
RDW: 12.1 % (ref 11.5–15.5)
WBC: 6.6 10*3/uL (ref 4.0–10.5)
nRBC: 0 % (ref 0.0–0.2)

## 2021-11-02 LAB — MAGNESIUM: Magnesium: 2.2 mg/dL (ref 1.7–2.4)

## 2021-11-02 MED ORDER — ACETAMINOPHEN 325 MG PO TABS
325.0000 mg | ORAL_TABLET | Freq: Once | ORAL | Status: AC
  Administered 2021-11-02: 325 mg via ORAL
  Filled 2021-11-02: qty 1

## 2021-11-02 NOTE — Plan of Care (Signed)
  Problem: Education: Goal: Knowledge of General Education information will improve Description: Including pain rating scale, medication(s)/side effects and non-pharmacologic comfort measures Outcome: Progressing   Problem: Clinical Measurements: Goal: Will remain free from infection Outcome: Progressing   Problem: Clinical Measurements: Goal: Cardiovascular complication will be avoided Outcome: Progressing   Problem: Pain Managment: Goal: General experience of comfort will improve Outcome: Progressing   Problem: Safety: Goal: Ability to remain free from injury will improve Outcome: Progressing   Problem: Health Behavior/Discharge Planning: Goal: Ability to manage health-related needs will improve Outcome: Progressing

## 2021-11-02 NOTE — Progress Notes (Signed)
PROGRESS NOTE    Jared Carey  OFB:[id number] DOB: 16-Jul-1955 DOA: 10/24/2021 PCP: Beacher May, MD   Assessment & Plan:   Principal Problem:   Complete heart block [facility]) Active Problems:   AKI (acute kidney injury) ([place])   Cardiogenic shock ([place])   Symptomatic bradycardia  Assessment and Plan: Cardiogenic shock: secondary to complete heart block. Was previously taking propranolol. S/p cardiac cath that showed normal coronary arteries, EF 45-50% w/ global hypokinesis. Temporary pacer removed 10/26/21. D/c propranolol. No further inpatient cardiac work-up is needed as per cardio  Acute hypoxic and hypercarbic respiratory failure: s/p intubation, ventilation & extubation. Rhinovirus was positive & possible COPD. Resolved.    Hypokalemia: WNL today   Mood Disorder: unknown severity or type. Continue on venlafaxine, carbamazepine, & klonopin    Right bimalleolar ankle fracture:  with minimal displacement. S/p open reduction internal fixation of right ankle fracture on 10/29/21 as per ortho surg. Percocet, morphine prn for pain    Possible cellulitis of RLE: completed abx course   Morbidly obese: BMI 45.8. Complicates overall care & prognosis   Constipation: resolved    DVT prophylaxis: lovenox  Code Status: full  Family Communication: discussed pt's care w/ pt's family bedside and answered their questions  Disposition Plan: possibly d/c to SNF.  Level of care: Telemetry Cardiac  Status is: Inpatient Remains inpatient appropriate because: waiting on insurance auth     Consultants:  Cardio Ortho surg   Procedures:   Antimicrobials:   Subjective: Pt c/o fatigue   Objective: Vitals:   11/01/21 1954 11/02/21 0021 11/02/21 0449 11/02/21 0500  BP: (!) 144/89 131/76 (!) 155/78   Pulse: 82 80 77   Resp: _0 Temp: 98.3 F (36.8 C) 98.5 F (36.9 C) 98 F (36.7 C)   TempSrc: Oral     SpO2: 96% 97% 94%   Weight:    (!) 153.4 kg  Height:         Intake/Output Summary (Last 24 hours) at 11/02/2021 7824 Last data filed at 11/02/2021 0710 Gross per 24 hour  Intake 1520 ml  Output 3700 ml  Net -2180 ml   Filed Weights   10/31/21 0447 11/01/21 0522 11/02/21 0500  Weight: (!) 159 kg (!) 156.2 kg (!) 153.4 kg    Examination:  General exam: Appears calm & comfortable. Morbidly obese  Respiratory system: decreased breath sounds b/l otherwise clear  Cardiovascular system: S1 & S2+. No rubs or clicks  Gastrointestinal system: Abd is soft, NT, obese & normal bowel sounds   Central nervous system: Alert and oriented. Moves all extremities  Skin: RLE is a cast & cast is C/D/I  Psychiatry: Judgement and insight appears normal. Appropriate mood and affect     Data Reviewed: I have personally reviewed following labs and imaging studies  CBC: Recent Labs  Lab 10/29/21 0719 10/30/21 0637 10/31/21 0805 11/01/21 0419 11/02/21 0453  WBC 5.1 9.0 6.9 6.1 6.6  HGB 12.2* 12.1* 12.0* 12.3* 12.2*  HCT 36.5* 36.1* 36.3* 37.2* 37.4*  MCV 88.6 89.8 89.0 89.4 90.8  PLT 235 204 237 248 235   Basic Metabolic Panel: Recent Labs  Lab 10/29/21 0719 10/30/21 0637 10/31/21 0805 11/01/21 0419 11/02/21 0453  NA 139 138 138 140 140  K 3.5 3.6 3.6 3.7 3.6  CL 103 103 102 105 103  CO2 _1 GLUCOSE 118* 121* 110* 107* 101*  BUN _2 CREATININE 0.88 0.87 0.83  0.83 0.84  CALCIUM 8.3* 8.2* 8.6* 8.6* 8.9  MG 2.1 2.1 2.1 2.2 2.2  PHOS 3.9 3.8 3.8 4.4 4.7*   GFR: Estimated Creatinine Clearance: 132 mL/min (by C-G formula based on SCr of 0.84 mg/dL). Liver Function Tests: No results for input(s): AST, ALT, ALKPHOS, BILITOT, PROT, ALBUMIN in the last 168 hours. No results for input(s): LIPASE, AMYLASE in the last 168 hours. No results for input(s): AMMONIA in the last 168 hours. Coagulation Profile: No results for input(s): INR, PROTIME in the last 168 hours.  Cardiac Enzymes: No results for input(s): CKTOTAL, CKMB,  CKMBINDEX, TROPONINI in the last 168 hours. BNP (last 3 results) No results for input(s): PROBNP in the last 8760 hours. HbA1C: No results for input(s): HGBA1C in the last 72 hours. CBG: Recent Labs  Lab 10/31/21 2112 11/01/21 0811 11/01/21 1215 11/01/21 1718 11/01/21 2137  GLUCAP 130* 113* 136* 90 108*   Lipid Profile: No results for input(s): CHOL, HDL, LDLCALC, TRIG, CHOLHDL, LDLDIRECT in the last 72 hours. Thyroid Function Tests: No results for input(s): TSH, T4TOTAL, FREET4, T3FREE, THYROIDAB in the last 72 hours. Anemia Panel: No results for input(s): VITAMINB12, FOLATE, FERRITIN, TIBC, IRON, RETICCTPCT in the last 72 hours. Sepsis Labs: No results for input(s): PROCALCITON, LATICACIDVEN in the last 168 hours.   Recent Results (from the past 240 hour(s))  Resp Panel by RT-PCR (Flu A&B, Covid) Nasopharyngeal Swab     Status: None   Collection Time: 10/24/21  1:35 PM   Specimen: Nasopharyngeal Swab; Nasopharyngeal(NP) swabs in vial transport medium  Result Value Ref Range Status   SARS Coronavirus 2 by RT PCR NEGATIVE NEGATIVE Final    Comment: (NOTE) SARS-CoV-2 target nucleic acids are NOT DETECTED.  The SARS-CoV-2 RNA is generally detectable in upper respiratory specimens during the acute phase of infection. The lowest concentration of SARS-CoV-2 viral copies this assay can detect is 138 copies/mL. A negative result does not preclude SARS-Cov-2 infection and should not be used as the sole basis for treatment or other patient management decisions. A negative result may occur with  improper specimen collection/handling, submission of specimen other than nasopharyngeal swab, presence of viral mutation(s) within the areas targeted by this assay, and inadequate number of viral copies(<138 copies/mL). A negative result must be combined with clinical observations, patient history, and epidemiological information. The expected result is Negative.  Fact Sheet for Patients:   EntrepreneurPulse.com.au  Fact Sheet for Healthcare Providers:  IncredibleEmployment.be  This test is no t yet approved or cleared by the [place] FDA and  has been authorized for detection and/or diagnosis of SARS-CoV-2 by FDA under an Emergency Use Authorization (EUA). This EUA will remain  in effect (meaning this test can be used) for the duration of the COVID-19 declaration under Section 564(b)(1) of the Act, 21 U.S.C.section 360bbb-3(b)(1), unless the authorization is terminated  or revoked sooner.       Influenza A by PCR NEGATIVE NEGATIVE Final   Influenza B by PCR NEGATIVE NEGATIVE Final    Comment: (NOTE) The Xpert Xpress SARS-CoV-2/FLU/RSV plus assay is intended as an aid in the diagnosis of influenza from Nasopharyngeal swab specimens and should not be used as a sole basis for treatment. Nasal washings and aspirates are unacceptable for Xpert Xpress SARS-CoV-2/FLU/RSV testing.  Fact Sheet for Patients: EntrepreneurPulse.com.au  Fact Sheet for Healthcare Providers: IncredibleEmployment.be  This test is not yet approved or cleared by the [place] FDA and has been authorized for detection and/or diagnosis of SARS-CoV-2 by FDA  under an Emergency Use Authorization (EUA). This EUA will remain in effect (meaning this test can be used) for the duration of the COVID-19 declaration under Section 564(b)(1) of the Act, 21 U.S.C. section 360bbb-3(b)(1), unless the authorization is terminated or revoked.  Performed at [facility], [address] [place], [place] [address]   MRSA Next Gen by PCR, Nasal     Status: None   Collection Time: 10/24/21  4:45 PM   Specimen: Nasal Mucosa; Nasal Swab  Result Value Ref Range Status   MRSA by PCR Next Gen NOT DETECTED NOT DETECTED Final    Comment: (NOTE) The GeneXpert MRSA Assay (FDA approved for NASAL specimens only), is one component of a  comprehensive MRSA colonization surveillance program. It is not intended to diagnose MRSA infection nor to guide or monitor treatment for MRSA infections. Test performance is not FDA approved in patients less than 57 years old. Performed at [facility], [address] [place], [place] [address]   Blood culture (routine x 2)     Status: None   Collection Time: 10/24/21  5:49 PM   Specimen: BLOOD  Result Value Ref Range Status   Specimen Description BLOOD BOTTLES DRAWN AEROBIC AND ANAEROBIC  Final   Special Requests   Final    Blood Culture results may not be optimal due to an inadequate volume of blood received in culture bottles RIGHT ANTECUBITAL   Culture   Final    NO GROWTH 5 DAYS Performed at [facility], [address] [place], [place] [address]    Report Status 10/29/2021 FINAL  Final  Blood culture (routine x 2)     Status: None   Collection Time: 10/24/21  5:51 PM   Specimen: BLOOD  Result Value Ref Range Status   Specimen Description BLOOD BOTTLES DRAWN AEROBIC AND ANAEROBIC  Final   Special Requests   Final    Blood Culture results may not be optimal due to an inadequate volume of blood received in culture bottles LEFT ANTECUBITAL   Culture   Final    NO GROWTH 5 DAYS Performed at [facility], [address] [place], [place] [address]    Report Status 10/29/2021 FINAL  Final  Respiratory (~20 pathogens) panel by PCR     Status: Abnormal   Collection Time: 10/24/21 10:30 PM   Specimen: Nasopharyngeal Swab; Respiratory  Result Value Ref Range Status   Adenovirus NOT DETECTED NOT DETECTED Final   Coronavirus 229E NOT DETECTED NOT DETECTED Final    Comment: (NOTE) The Coronavirus on the Respiratory Panel, DOES NOT test for the novel  Coronavirus (2019 nCoV)    Coronavirus HKU1 NOT DETECTED NOT DETECTED Final   Coronavirus NL63 NOT DETECTED NOT DETECTED Final   Coronavirus OC43 NOT DETECTED NOT DETECTED Final   Metapneumovirus NOT  DETECTED NOT DETECTED Final   Rhinovirus / Enterovirus DETECTED (A) NOT DETECTED Final   Influenza A NOT DETECTED NOT DETECTED Final   Influenza B NOT DETECTED NOT DETECTED Final   Parainfluenza Virus 1 NOT DETECTED NOT DETECTED Final   Parainfluenza Virus 2 NOT DETECTED NOT DETECTED Final   Parainfluenza Virus 3 NOT DETECTED NOT DETECTED Final   Parainfluenza Virus 4 NOT DETECTED NOT DETECTED Final   Respiratory Syncytial Virus NOT DETECTED NOT DETECTED Final   Bordetella pertussis NOT DETECTED NOT DETECTED Final   Bordetella Parapertussis NOT DETECTED NOT DETECTED Final   Chlamydophila pneumoniae NOT DETECTED NOT DETECTED Final   Mycoplasma pneumoniae NOT  DETECTED NOT DETECTED Final    Comment: Performed at [facility], [address], [place], [place] [address]         Radiology Studies: No results found.      Scheduled Meds:  amLODipine  5 mg Oral Daily   aspirin  81 mg Oral Daily   carbamazepine  200 mg Oral BID   chlorhexidine gluconate (MEDLINE KIT)  15 mL Mouth Rinse BID   Chlorhexidine Gluconate Cloth  6 each Topical Q0600   clonazePAM  0.25 mg Oral BID   docusate sodium  100 mg Oral BID   enoxaparin (LOVENOX) injection  0.5 mg/kg Subcutaneous Q24H   famotidine  20 mg Oral Daily   fluticasone  1 spray Each Nare Daily   guaiFENesin  600 mg Oral BID   insulin aspart  0-15 Units Subcutaneous TID WC   oxybutynin  20 mg Oral BID   traZODone  100 mg Oral QHS   venlafaxine XR  150 mg Oral Q breakfast   Continuous Infusions:  sodium chloride 0 mL (10/26/21 1430)     LOS: 9 days    Time spent: 26 mins    Wyvonnia Dusky, MD [facility] Pager [phone]  If 7PM-7AM, please contact night-coverage 11/02/2021, 8:12 AM

## 2021-11-02 NOTE — TOC Progression Note (Signed)
Transition of Care [facility]) - Progression Note    Patient Details  Name: Jared Carey MRN: [id number] Date of Birth: 1956/01/21  Transition of Care [facility]) CM/SW Contact  Gildardo Griffes, [place] Phone Number: 11/02/2021, 10:11 AM  Clinical Narrative:     CSW provided bed offer of [facility] to patient and his spouse Jared Carey, this is only bed offer at this time. They are in agreement, insurance [employer] has been started.   Expected Discharge Plan: Skilled Nursing Facility Barriers to Discharge: Continued Medical Work up  Expected Discharge Plan and Services Expected Discharge Plan: Skilled Nursing Facility       Living arrangements for the past 2 months: Single Family Home                                       Social Determinants of Health (SDOH) Interventions    Readmission Risk Interventions     View : No data to display.

## 2021-11-02 NOTE — Progress Notes (Signed)
   Subjective: 4 Days Post-Op Procedure(s) (LRB): OPEN REDUCTION INTERNAL FIXATION (ORIF) ANKLE FRACTURE (Right) Patient reports pain as mild.   Patient is well, and has had no acute complaints or problems Denies any CP, SOB, ABD pain. We will continue therapy today.   Objective: Vital signs in last 24 hours: Temp:  [98 F (36.7 C)-98.5 F (36.9 C)] 98 F (36.7 C) (05/22 0818) Pulse Rate:  [70-82] 70 (05/22 0818) Resp:  [18-20] 19 (05/22 0818) BP: (131-155)/(76-89) 146/87 (05/22 0818) SpO2:  [93 %-100 %] 93 % (05/22 0818) Weight:  [153.4 kg] 153.4 kg (05/22 0500)  Intake/Output from previous day: 05/21 0701 - 05/22 0700 In: 1520 [P.O.:1520] Out: 3080 [Urine:3080] Intake/Output this shift: Total I/O In: -  Out: 620 [Urine:620]  Recent Labs    10/31/21 0805 11/01/21 0419 11/02/21 0453  HGB 12.0* 12.3* 12.2*   Recent Labs    11/01/21 0419 11/02/21 0453  WBC 6.1 6.6  RBC 4.16* 4.12*  HCT 37.2* 37.4*  PLT 248 291   Recent Labs    11/01/21 0419 11/02/21 0453  NA 140 140  K 3.7 3.6  CL 105 103  CO2 29 29  BUN 13 12  CREATININE 0.83 0.84  GLUCOSE 107* 101*  CALCIUM 8.6* 8.9   No results for input(s): LABPT, INR in the last 72 hours.  EXAM General - Patient is Alert, Appropriate, and Oriented Extremity - Neurovascular intact Sensation intact distally Intact pulses distally Compartments soft Incisions appear well  Dressing - dressing C/D/I and no drainage, splint intact Motor Function - intact, moving toes well on exam.   Past Medical History:  Diagnosis Date   ADD (attention deficit disorder)    Akathisia 01/18/2019   Allergy    Anemia    Anxiety    Barrett esophagus    Chest pain 08/07/2016   Colon polyps    Depression    Diverticulosis    Dysphagia    Edema    Gait abnormality 03/09/2021   GERD (gastroesophageal reflux disease)    Hypertension    Incontinence    urge   Major depressive disorder    Obesity    Obesity    Occipital  neuralgia 03/09/2021   Bilateral   Osteoarthritis of thumbs, bilateral    Overactive bladder    Renal infarction (HCC)    Tardive akathisia    Tardive dyskinesia    Thoracic stomach hernia    Thyroid disease    Thyroid nodule    benign   Venous insufficiency     Assessment/Plan:   4 Days Post-Op Procedure(s) (LRB): OPEN REDUCTION INTERNAL FIXATION (ORIF) ANKLE FRACTURE (Right) Principal Problem:   Complete heart block (HCC) Active Problems:   AKI (acute kidney injury) (HCC)   Cardiogenic shock (HCC)   Symptomatic bradycardia  Estimated body mass index is 45.85 kg/m as calculated from the following:   Height as of this encounter: 6' (1.829 m).   Weight as of this encounter: 153.4 kg. Advance diet Up with therapy. TTWB RLE Pain well controlled VSS Cast applied today.  Patient can follow up 2 weeks post op (11/12/2021) in the clinic for cast and staple removal.  DVT Prophylaxis - Lovenox and SCDs    T. Cranston Neighbor, PA-C [facility] 11/02/2021, 8:24 AM

## 2021-11-02 NOTE — Progress Notes (Signed)
Occupational Therapy Treatment Patient Details Name: Jared Carey MRN: [id number] DOB: [birth year] Today's Date: 11/02/2021   History of present illness Pt is a 66 yo M with PMH that includes akathisia, anemia, anxiety, depression, dsyphagia, HTN, MDD, and tardive dyskinesia found unresponsive at his home, bradycardic.  External pacing initiated by EMS after EKG in the field showed complete heart block. Pacing was discontinued in ED after epinephrine gtt started, however, complete heart block recurred and he became hypoxic. Patient was subsequently intubated. Cardiac catheterization performed for coronary angiography (which showed normal coronaries) and temp perm pacemaker was placed and later removed on 10/26/21.  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   OT comments  Upon entering the room, pt supine in bed and reports having just woken up from nap but is agreeable to OT intervention. Pt performs supine >sit with increased time and supervision to EOB. Pt remains seated on EOB for therapeutic exercise. OT educated and demonstrates use of level 2 red theraband for B UE strengthening exercises. Pt returns demonstrations with min cuing for proper technique. Pt performs 10 reps of chest presses, bicep curls, alternating punches, shoulder elevation, and shoulder diagonals with rest breaks as needed between reps. Pt able to laterally scoot to the L along EOB with cuing for technique while maintaining precautions. Pt needing min A to return B LEs back to bed. RN present in the room at end of session. All needs within reach. Pt continues to benefit from OT intervention with continues recommendation for short term  rehab at hospital discharge. Theraband left in room for pt to utilize on his own time.    Recommendations for follow up therapy are one component of a multi-disciplinary discharge planning process, led by the attending physician.  Recommendations may be updated based on patient status, additional functional criteria and insurance authorization.    Follow Up Recommendations  Skilled nursing-short term rehab (<3 hours/day)    Assistance Recommended at Discharge Intermittent Supervision/Assistance  Patient can return home with the following  A lot of help with walking and/or transfers;A lot of help with bathing/dressing/bathroom;Help with stairs or ramp for entrance;Assist for transportation   Equipment Recommendations  Other (comment) (defer to next venue of care)       Precautions / Restrictions Precautions Precautions: Fall Restrictions Weight Bearing Restrictions: Yes RLE Weight Bearing: Touchdown weight bearing Other Position/Activity Restrictions: per smart chat Dr. Rosita Kea 5/19       Mobility Bed Mobility Overal bed mobility: Needs Assistance Bed Mobility: Supine to Sit, Sit to Supine     Supine to sit: Supervision Sit to supine: Min assist   General bed mobility comments: min A for B LEs to return to bed    Transfers Overall transfer level: Needs assistance                Lateral/Scoot Transfers: Supervision General transfer comment: min cuing for technique     Balance Overall balance assessment: Needs assistance, History of Falls Sitting-balance support: Bilateral upper extremity supported Sitting balance-Leahy Scale: Good                                     ADL either performed or assessed  with clinical judgement    Extremity/Trunk Assessment Upper Extremity Assessment Upper Extremity Assessment: Generalized weakness   Lower Extremity Assessment Lower Extremity Assessment: Generalized weakness        Vision Patient Visual Report:  No change from baseline            Cognition Arousal/Alertness: Awake/alert Behavior During Therapy: WFL for tasks assessed/performed Overall Cognitive Status: Within Functional Limits for tasks assessed                                 General Comments: Pt is A and O x 4                   Pertinent Vitals/ Pain       Pain Assessment Pain Assessment: No/denies pain         Frequency  Min 2X/week        Progress Toward Goals  OT Goals(current goals can now be found in the care plan section)  Progress towards OT goals: Progressing toward goals  Acute Rehab OT Goals Patient Stated Goal: to get stronger and go to rehab OT Goal Formulation: With patient Time For Goal Achievement: 11/13/21  Plan Discharge plan remains appropriate;Frequency remains appropriate       AM-PAC OT "6 Clicks" Daily Activity     Outcome Measure   Help from another person eating meals?: None Help from another person taking care of personal grooming?: A Little Help from another person toileting, which includes using toliet, bedpan, or urinal?: A Lot Help from another person bathing (including washing, rinsing, drying)?: A Lot Help from another person to put on and taking off regular upper body clothing?: A Little Help from another person to put on and taking off regular lower body clothing?: A Lot 6 Click Score: 16    End of Session    OT Visit Diagnosis: Unsteadiness on feet (R26.81);Muscle weakness (generalized) (M62.81);Pain Pain - Right/Left: Right Pain - part of body: Ankle and joints of foot   Activity Tolerance Patient tolerated treatment well   Patient Left in bed;with call bell/phone within reach;with bed alarm set;with nursing/sitter in room   Nurse Communication Mobility status        Time: 9675-9163 OT Time Calculation (min): 24 min  Charges: OT General Charges $OT Visit: 1 Visit OT Treatments $Therapeutic Exercise: 23-37 mins  Jackquline Denmark,  MS, OTR/L , CBIS ascom [phone]  11/02/21, 4:20 PM

## 2021-11-02 NOTE — Progress Notes (Signed)
Physical Therapy Treatment Patient Details Name: Jared Carey MRN: [id number] DOB: 07-Jul-1955 Today's Date: 11/02/2021   History of Present Illness Pt is a 66 yo M with PMH that includes akathisia, anemia, anxiety, depression, dsyphagia, HTN, MDD, and tardive dyskinesia found unresponsive at his home, bradycardic.  External pacing initiated by EMS after EKG in the field showed complete heart block. Pacing was discontinued in ED after epinephrine gtt started, however, complete heart block recurred and he became hypoxic. Patient was subsequently intubated. Cardiac catheterization performed for coronary angiography (which showed normal coronaries) and temp perm pacemaker was placed and later removed on 10/26/21.  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    PT Comments    Pt was pleasant and motivated to participate during the session and put forth good effort throughout. Pt required no physical assistance with any functional task but did require extra time, effort, and cuing for sequencing during the session.  Pt education provided on proper sequencing with gait using step-to pattern with TTWB on the RLE.  During amb from bed to chair pt reverted to taking hop-to steps without LOB and with self-reported improvement in confidence.  After sitting verbal and visual reinforcement given for proper sequencing for step-to pattern with TTWB on the RLE with plan for further practice during the following session.  Pt will benefit from PT services in a SNF setting upon discharge to safely address deficits listed in patient problem list for decreased caregiver assistance and eventual return to PLOF.      Recommendations for follow up therapy are one component of a multi-disciplinary discharge planning process, led by the attending physician.  Recommendations may be updated based on patient status, additional functional criteria and insurance authorization.  Follow Up Recommendations  Skilled nursing-short term rehab (<3 hours/day)     Assistance Recommended at Discharge Intermittent Supervision/Assistance  Patient can return home with the following A lot of help with bathing/dressing/bathroom;Assistance with cooking/housework;Help with stairs or ramp for entrance;Assist for transportation;A lot of help with walking and/or transfers   Equipment Recommendations  None recommended by PT    Recommendations for Other Services       Precautions / Restrictions Precautions Precautions: Fall Restrictions Weight Bearing Restrictions: Yes RLE Weight Bearing: Touchdown weight bearing     Mobility  Bed Mobility Overal bed mobility: Needs Assistance Bed Mobility: Supine to Sit     Supine to sit: Supervision     General bed mobility comments: Min extra time and effort only    Transfers Overall transfer level: Needs assistance Equipment used: Rolling walker (2 wheels) Transfers: Sit to/from Stand Sit to Stand: Min guard, From elevated surface           General transfer comment: Min verbal cues for sequencing    Ambulation/Gait Ambulation/Gait assistance: Min guard Gait Distance (Feet): 3 Feet Assistive device: Rolling walker (2 wheels)   Gait velocity: decreased     General Gait Details: Hop-to pattern with min verbal and visual cues for step-to pattern with TTWB on the RLE   Stairs             Wheelchair Mobility    Modified Rankin (Stroke Patients Only)       Balance Overall balance assessment: Needs assistance, History of Falls Sitting-balance  support: Bilateral upper extremity supported Sitting balance-Leahy Scale: Good     Standing balance  support: During functional activity, Bilateral upper extremity supported, Reliant on assistive device for balance Standing balance-Leahy Scale: Fair                              Cognition Arousal/Alertness: Awake/alert Behavior During Therapy: WFL for tasks assessed/performed Overall Cognitive Status: Within Functional Limits for tasks assessed                                          Exercises Total Joint Exercises Ankle Circles/Pumps: Strengthening, Left, 10 reps (toe curls on the RLE) Quad Sets: Strengthening, Both, 10 reps Gluteal Sets: Strengthening, Both, 10 reps Hip ABduction/ADduction: Strengthening, Both, 10 reps (gentle manual resistance on the LLE) Straight Leg Raises: Strengthening, Both, 10 reps Long Arc Quad: Strengthening, Both, 5 reps, 10 reps Knee Flexion: Strengthening, Both, 5 reps, 10 reps Other Exercises Other Exercises: HEP review for RLE toe curls, LLE APs, and BLE QS, GS, and LAQs    General Comments        Pertinent Vitals/Pain Pain Assessment Pain Assessment: No/denies pain    Home Living                          Prior Function            PT Goals (current goals can now be found in the care plan section) Progress towards PT goals: Progressing toward goals    Frequency    7X/week      PT Plan Current plan remains appropriate    Co-evaluation              AM-PAC PT "6 Clicks" Mobility   Outcome Measure  Help needed turning from your back to your side while in a flat bed without using bedrails?: A Little Help needed moving from lying on your back to sitting on the side of a flat bed without using bedrails?: A Little Help needed moving to and from a bed to a chair (including a wheelchair)?: A Little Help needed standing up from a chair using your arms (e.g., wheelchair or bedside chair)?: A Little Help needed to walk in hospital room?: Total Help needed climbing 3-5 steps with a railing? :  Total 6 Click Score: 14    End of Session Equipment Utilized During Treatment: Gait belt Activity Tolerance: Patient tolerated treatment well;No increased pain Patient left: in chair;with call bell/phone within reach;with chair alarm set Nurse Communication: Mobility status PT Visit Diagnosis: History of falling (Z91.81);Unsteadiness on feet (R26.81);Difficulty in walking, not elsewhere classified (R26.2);Muscle weakness (generalized) (M62.81);Repeated falls (R29.6);Dizziness and giddiness (R42)     Time: 6962-9528 PT Time Calculation (min) (ACUTE ONLY): 27 min  Charges:  $Therapeutic Exercise: 8-22 mins $Therapeutic Activity: 8-22 mins                     D. Scott Lunden Mcleish PT, DPT 11/02/21, 1:34 PM

## 2021-11-03 DIAGNOSIS — Z6841 Body Mass Index (BMI) 40.0 and over, adult: Secondary | ICD-10-CM | POA: Diagnosis not present

## 2021-11-03 DIAGNOSIS — S82891A Other fracture of right lower leg, initial encounter for closed fracture: Secondary | ICD-10-CM | POA: Diagnosis not present

## 2021-11-03 DIAGNOSIS — Z818 Family history of other mental and behavioral disorders: Secondary | ICD-10-CM | POA: Diagnosis not present

## 2021-11-03 DIAGNOSIS — Z833 Family history of diabetes mellitus: Secondary | ICD-10-CM | POA: Diagnosis not present

## 2021-11-03 DIAGNOSIS — F32A Depression, unspecified: Secondary | ICD-10-CM | POA: Diagnosis present

## 2021-11-03 DIAGNOSIS — S82851D Displaced trimalleolar fracture of right lower leg, subsequent encounter for closed fracture with routine healing: Secondary | ICD-10-CM | POA: Diagnosis not present

## 2021-11-03 DIAGNOSIS — R404 Transient alteration of awareness: Secondary | ICD-10-CM | POA: Diagnosis not present

## 2021-11-03 DIAGNOSIS — I441 Atrioventricular block, second degree: Secondary | ICD-10-CM | POA: Diagnosis not present

## 2021-11-03 DIAGNOSIS — G4709 Other insomnia: Secondary | ICD-10-CM | POA: Diagnosis not present

## 2021-11-03 DIAGNOSIS — M25571 Pain in right ankle and joints of right foot: Secondary | ICD-10-CM | POA: Diagnosis not present

## 2021-11-03 DIAGNOSIS — S82841D Displaced bimalleolar fracture of right lower leg, subsequent encounter for closed fracture with routine healing: Secondary | ICD-10-CM | POA: Diagnosis not present

## 2021-11-03 DIAGNOSIS — I4510 Unspecified right bundle-branch block: Secondary | ICD-10-CM | POA: Diagnosis not present

## 2021-11-03 DIAGNOSIS — R2681 Unsteadiness on feet: Secondary | ICD-10-CM | POA: Diagnosis not present

## 2021-11-03 DIAGNOSIS — M4312 Spondylolisthesis, cervical region: Secondary | ICD-10-CM | POA: Diagnosis not present

## 2021-11-03 DIAGNOSIS — W182XXA Fall in (into) shower or empty bathtub, initial encounter: Secondary | ICD-10-CM | POA: Diagnosis present

## 2021-11-03 DIAGNOSIS — Z800 Family history of malignant neoplasm of digestive organs: Secondary | ICD-10-CM | POA: Diagnosis not present

## 2021-11-03 DIAGNOSIS — Z006 Encounter for examination for normal comparison and control in clinical research program: Secondary | ICD-10-CM | POA: Diagnosis not present

## 2021-11-03 DIAGNOSIS — F988 Other specified behavioral and emotional disorders with onset usually occurring in childhood and adolescence: Secondary | ICD-10-CM | POA: Diagnosis present

## 2021-11-03 DIAGNOSIS — S0181XA Laceration without foreign body of other part of head, initial encounter: Secondary | ICD-10-CM | POA: Diagnosis not present

## 2021-11-03 DIAGNOSIS — Z7401 Bed confinement status: Secondary | ICD-10-CM | POA: Diagnosis not present

## 2021-11-03 DIAGNOSIS — R55 Syncope and collapse: Secondary | ICD-10-CM | POA: Diagnosis not present

## 2021-11-03 DIAGNOSIS — S82841A Displaced bimalleolar fracture of right lower leg, initial encounter for closed fracture: Secondary | ICD-10-CM | POA: Diagnosis not present

## 2021-11-03 DIAGNOSIS — J9601 Acute respiratory failure with hypoxia: Secondary | ICD-10-CM | POA: Diagnosis not present

## 2021-11-03 DIAGNOSIS — J302 Other seasonal allergic rhinitis: Secondary | ICD-10-CM | POA: Diagnosis not present

## 2021-11-03 DIAGNOSIS — F324 Major depressive disorder, single episode, in partial remission: Secondary | ICD-10-CM | POA: Diagnosis not present

## 2021-11-03 DIAGNOSIS — R2689 Other abnormalities of gait and mobility: Secondary | ICD-10-CM | POA: Diagnosis not present

## 2021-11-03 DIAGNOSIS — M6281 Muscle weakness (generalized): Secondary | ICD-10-CM | POA: Diagnosis not present

## 2021-11-03 DIAGNOSIS — I455 Other specified heart block: Secondary | ICD-10-CM | POA: Diagnosis not present

## 2021-11-03 DIAGNOSIS — N3281 Overactive bladder: Secondary | ICD-10-CM | POA: Diagnosis present

## 2021-11-03 DIAGNOSIS — G4489 Other headache syndrome: Secondary | ICD-10-CM | POA: Diagnosis not present

## 2021-11-03 DIAGNOSIS — R197 Diarrhea, unspecified: Secondary | ICD-10-CM | POA: Diagnosis not present

## 2021-11-03 DIAGNOSIS — R001 Bradycardia, unspecified: Secondary | ICD-10-CM | POA: Diagnosis not present

## 2021-11-03 DIAGNOSIS — Z4789 Encounter for other orthopedic aftercare: Secondary | ICD-10-CM | POA: Diagnosis not present

## 2021-11-03 DIAGNOSIS — Z886 Allergy status to analgesic agent status: Secondary | ICD-10-CM | POA: Diagnosis not present

## 2021-11-03 DIAGNOSIS — I5022 Chronic systolic (congestive) heart failure: Secondary | ICD-10-CM | POA: Diagnosis present

## 2021-11-03 DIAGNOSIS — I499 Cardiac arrhythmia, unspecified: Secondary | ICD-10-CM | POA: Diagnosis not present

## 2021-11-03 DIAGNOSIS — Z8052 Family history of malignant neoplasm of bladder: Secondary | ICD-10-CM | POA: Diagnosis not present

## 2021-11-03 DIAGNOSIS — Z743 Need for continuous supervision: Secondary | ICD-10-CM | POA: Diagnosis not present

## 2021-11-03 DIAGNOSIS — Y92012 Bathroom of single-family (private) house as the place of occurrence of the external cause: Secondary | ICD-10-CM | POA: Diagnosis not present

## 2021-11-03 DIAGNOSIS — R278 Other lack of coordination: Secondary | ICD-10-CM | POA: Diagnosis not present

## 2021-11-03 DIAGNOSIS — Z8249 Family history of ischemic heart disease and other diseases of the circulatory system: Secondary | ICD-10-CM | POA: Diagnosis not present

## 2021-11-03 DIAGNOSIS — K219 Gastro-esophageal reflux disease without esophagitis: Secondary | ICD-10-CM | POA: Diagnosis present

## 2021-11-03 DIAGNOSIS — W19XXXA Unspecified fall, initial encounter: Secondary | ICD-10-CM | POA: Diagnosis not present

## 2021-11-03 DIAGNOSIS — S0003XA Contusion of scalp, initial encounter: Secondary | ICD-10-CM | POA: Diagnosis not present

## 2021-11-03 DIAGNOSIS — Z820 Family history of epilepsy and other diseases of the nervous system: Secondary | ICD-10-CM | POA: Diagnosis not present

## 2021-11-03 DIAGNOSIS — I5030 Unspecified diastolic (congestive) heart failure: Secondary | ICD-10-CM | POA: Diagnosis not present

## 2021-11-03 DIAGNOSIS — I442 Atrioventricular block, complete: Secondary | ICD-10-CM | POA: Diagnosis not present

## 2021-11-03 DIAGNOSIS — I110 Hypertensive heart disease with heart failure: Secondary | ICD-10-CM | POA: Diagnosis present

## 2021-11-03 DIAGNOSIS — Z888 Allergy status to other drugs, medicaments and biological substances status: Secondary | ICD-10-CM | POA: Diagnosis not present

## 2021-11-03 DIAGNOSIS — Z8781 Personal history of (healed) traumatic fracture: Secondary | ICD-10-CM | POA: Diagnosis not present

## 2021-11-03 DIAGNOSIS — Z87891 Personal history of nicotine dependence: Secondary | ICD-10-CM | POA: Diagnosis not present

## 2021-11-03 DIAGNOSIS — Z9889 Other specified postprocedural states: Secondary | ICD-10-CM | POA: Diagnosis not present

## 2021-11-03 DIAGNOSIS — W1830XD Fall on same level, unspecified, subsequent encounter: Secondary | ICD-10-CM | POA: Diagnosis not present

## 2021-11-03 DIAGNOSIS — I10 Essential (primary) hypertension: Secondary | ICD-10-CM | POA: Diagnosis not present

## 2021-11-03 DIAGNOSIS — Z803 Family history of malignant neoplasm of breast: Secondary | ICD-10-CM | POA: Diagnosis not present

## 2021-11-03 LAB — BASIC METABOLIC PANEL
Anion gap: 8 (ref 5–15)
BUN: 14 mg/dL (ref 8–23)
CO2: 28 mmol/L (ref 22–32)
Calcium: 8.8 mg/dL — ABNORMAL LOW (ref 8.9–10.3)
Chloride: 102 mmol/L (ref 98–111)
Creatinine, Ser: 0.79 mg/dL (ref 0.61–1.24)
GFR, Estimated: >60 mL/min (ref >60)
Glucose, Bld: 109 mg/dL — ABNORMAL HIGH (ref 70–99)
Potassium: 4 mmol/L (ref 3.5–5.1)
Sodium: 138 mmol/L (ref 135–145)

## 2021-11-03 LAB — GLUCOSE, CAPILLARY
Glucose-Capillary: 123 mg/dL — ABNORMAL HIGH (ref 70–99)
Glucose-Capillary: 140 mg/dL — ABNORMAL HIGH (ref 70–99)

## 2021-11-03 LAB — CBC
HCT: 37.4 % — ABNORMAL LOW (ref 39.0–52.0)
Hemoglobin: 12.6 g/dL — ABNORMAL LOW (ref 13.0–17.0)
MCH: 29.9 pg (ref 26.0–34.0)
MCHC: 33.7 g/dL (ref 30.0–36.0)
MCV: 88.6 fL (ref 80.0–100.0)
Platelets: 305 10*3/uL (ref 150–400)
RBC: 4.22 MIL/uL (ref 4.22–5.81)
RDW: 12.3 % (ref 11.5–15.5)
WBC: 5.8 10*3/uL (ref 4.0–10.5)
nRBC: 0 % (ref 0.0–0.2)

## 2021-11-03 LAB — PHOSPHORUS: Phosphorus: 4.3 mg/dL (ref 2.5–4.6)

## 2021-11-03 LAB — MAGNESIUM: Magnesium: 2.2 mg/dL (ref 1.7–2.4)

## 2021-11-03 MED ORDER — CLONAZEPAM 0.5 MG PO TABS
0.5000 mg | ORAL_TABLET | Freq: Three times a day (TID) | ORAL | 0 refills | Status: DC
Start: 2021-11-03 — End: 2022-05-17

## 2021-11-03 MED ORDER — ENOXAPARIN SODIUM 80 MG/0.8ML IJ SOSY: 0.5000 mg/kg | PREFILLED_SYRINGE | INTRAMUSCULAR | 0 refills | Status: DC

## 2021-11-03 MED ORDER — OXYCODONE-ACETAMINOPHEN 5-325 MG PO TABS: 1.0000 | ORAL_TABLET | Freq: Four times a day (QID) | ORAL | 0 refills | Status: AC | PRN

## 2021-11-03 NOTE — TOC Transition Note (Signed)
Transition of Care [facility]) - CM/SW Discharge Note   Patient Details  Name: NENAD TRETO MRN: [id number] Date of Birth: 09/26/1955  Transition of Care [facility]) CM/SW Contact:  Alberteen Sam, LCSW Phone Number: 11/03/2021, 1:11 PM   Clinical Narrative:     [facility]: [facility] Anticipated DC date: 11/03/21 Transport by: Johnanna Schneiders  Per MD patient ready for DC to [facility] . RN, patient, patient's family, and facility notified of DC. Discharge Summary sent to facility. RN given number for report  [phone] Room 602P. DC packet on chart. Ambulance transport requested for patient.  CSW signing off.  Pricilla Riffle, LCSW    Final next level of care: Skilled Nursing Facility Barriers to Discharge: No Barriers Identified   Patient Goals and CMS Choice Patient states their goals for this hospitalization and ongoing recovery are:: to go home CMS Medicare.gov Compare Post Acute Care list provided to:: Patient Choice offered to / list presented to : Patient  Discharge Placement              Patient chooses bed at: [facility] Patient to be transferred to facility by: [facility]   Patient and family notified of of transfer: 11/03/21  Discharge Plan and Services                                     Social Determinants of Health (SDOH) Interventions     Readmission Risk Interventions     View : No data to display.

## 2021-11-03 NOTE — Discharge Summary (Signed)
Physician Discharge Summary  NAZIM KADLEC ZOX:[id number] DOB: [birth year] DOA: 10/24/2021  PCP: Nada Boozer, MD  Admit date: 10/24/2021 Discharge date: 11/03/2021  Admitted From: home  Disposition:  SNF  Recommendations for Outpatient Follow-up:  Follow up with PCP in 1-2 weeks F/u w/ ortho surg, Dr. Rosita Kea or PA Floyce Stakes, on 11/12/21] F/u w/ cardio, Dr. Gwen Pounds, in 1-2 weeks   Home Health: no  Equipment/Devices:  Discharge Condition: stable  CODE STATUS: full  Diet recommendation: Heart Healthy   Brief/Interim Summary: HPI was taken from Dr. Arlean Hopping: 66 y.o. male found unresponsive at his home, bradycardic. External pacing initiated by EMS after EKG in the field showed complete heart block. Pacing was discontinued in ED after epinephrine gtt started, however, complete heart block recurred and he became hypoxic. Propofol, Versed initiated for sedation. Patient was subsequently intubated in the ED. Dr. Virl Son (Cardiology) was consulted and recommended cardiac catheterization for coronary angiography (which showed normal coronaries) and temp perm pacemaker placement.   Notable labs include WBC 12.5, Na 132, K 5.7, CO2 17, creatinine 1.43 (baseline ~1.0), lactate 3.9. Trop not elevated, BNP 27. Lactate 3.9. TSH wnl. UDS positive for BZ and THC. Initial VBG showed pH 7.22, pCO2 60. Subsequent ABG showed pH 7.25, pCO2 60, pO2 158.   CXR shows low lung volumes and pulmonary edema. Comment on mediastinal opacity which needs further evaluation on CT chest.   As per Dr. Marylu Lund: 5/13: intubated, temp perm pacer placed, coronary angiography showed patent coronaries 5/14: EXTUBATED. Ankle fx evaluated by Ortho, plan to treat nonoperatively for now.  Pressors weaned off. Right radial sheath removed with TR band placed. 5/15: Temporary pacer wire removed. ABX deescalated from Vancomycin to Unasyn (cellulitis).  Consider Transfer to progressive cardiac unit.   5/16 TRH pickup   As per Dr. Mayford Knife  5/17-5/23/23: Pt was also found to have right bimalleolar ankle fracture and pt is s/p open reduction internal fixation of right ankle fracture on 10/29/21 as per ortho surg. A cast was put on RLE on 11/02/21. Pt will f/u w/ ortho surg on 11/12/21 for cast & staple removal. PT/OT evaluated the pt and recommend SNF. For more information, please see previous progress/consult notes.      Discharge Diagnoses:  Principal Problem:   Complete heart block (HCC) Active Problems:   AKI (acute kidney injury) (HCC)   Cardiogenic shock (HCC)   Symptomatic bradycardia Cardiogenic shock: secondary to complete heart block. Was previously taking propranolol. S/p cardiac cath that showed normal coronary arteries, EF 45-50% w/ global hypokinesis. Temporary pacer removed 10/26/21. D/c propranolol. No further inpatient cardiac work-up is needed as per cardio. Will need to f/u outpatient w/ cardio    Acute hypoxic and hypercarbic respiratory failure: s/p intubation, ventilation & extubation. Rhinovirus was positive & possible COPD. Resolved.    Hypokalemia: WNL today    Mood Disorder: unknown severity or type. Continue on venlafaxine, carbamazepine, & klonopin     Right bimalleolar ankle fracture:  with minimal displacement. S/p open reduction internal fixation of right ankle fracture on 10/29/21 as per ortho surg. Percocet  prn for pain    Possible cellulitis of RLE: completed abx course    Morbidly obese: BMI 45.8. Complicates overall care & prognosis    Constipation: resolved    Discharge Instructions  Discharge Instructions     Diet - low sodium heart healthy   Complete by: As directed    Discharge instructions   Complete by: As directed    F/u w/ PCP  in 1-2 weeks. F/u w/ ortho surg, Dr. Rosita Kea or PA Floyce Stakes, on 11/12/21   Discharge instructions   Complete by: As directed    F/u w/ cardio, Dr. Gwen Pounds, in 1-2 weeks   Increase activity slowly   Complete by: As directed    No wound care   Complete by:  As directed    No wound care   Complete by: As directed       Allergies as of 11/03/2021       Reactions   Aripiprazole Other (See Comments)   Naproxen Other (See Comments)   Flexeril [cyclobenzaprine]    Gabapentin    Increased acathisia   Benadryl [diphenhydramine Hcl (sleep)]    Omeprazole Diarrhea   Robaxin [methocarbamol] Rash   Simvastatin         Medication List     STOP taking these medications    ibuprofen 200 MG tablet Commonly known as: ADVIL   propranolol 40 MG tablet Commonly known as: INDERAL       TAKE these medications    albuterol 108 (90 Base) MCG/ACT inhaler Commonly known as: VENTOLIN HFA Inhale 2 puffs into the lungs every 6 (six) hours as needed for wheezing or shortness of breath.   amLODipine 5 MG tablet Commonly known as: NORVASC Take 5 mg by mouth daily.   aspirin 325 MG tablet Take 325 mg by mouth 2 (two) times daily.   carbamazepine 200 MG tablet Commonly known as: TEGRETOL Take 1 tablet (200 mg total) by mouth 3 (three) times daily. What changed: when to take this   clonazePAM 0.5 MG tablet Commonly known as: KLONOPIN Take 1 tablet (0.5 mg total) by mouth in the morning, at noon, and at bedtime for 1 day. What changed: when to take this   Co Q 10 100 MG Caps Take 100 mg by mouth daily.   enoxaparin 80 MG/0.8ML injection Commonly known as: LOVENOX Inject 0.775 mLs (77.5 mg total) into the skin daily for 14 days. Start taking on: Nov 04, 2021   fluticasone 50 MCG/ACT nasal spray Commonly known as: FLONASE Place 1 spray into both nostrils daily.   LIDOCAINE EX Apply topically. 4% ointment.1 application topically 3 times daily as needed.   loratadine 10 MG tablet Commonly known as: CLARITIN Take 10 mg by mouth daily as needed.   multivitamin capsule Take 2 capsules by mouth daily.   oxybutynin 10 MG 24 hr tablet Commonly known as: DITROPAN-XL Take 20 mg by mouth 2 (two) times daily.   oxyCODONE-acetaminophen  5-325 MG tablet Commonly known as: PERCOCET/ROXICET Take 1 tablet by mouth every 6 (six) hours as needed for up to 1 day.   traZODone 100 MG tablet Commonly known as: DESYREL Take 100 mg by mouth at bedtime.   venlafaxine XR 75 MG 24 hr capsule Commonly known as: EFFEXOR-XR Take 150 mg by mouth daily with breakfast.        Follow-up Information     Evon Slack, PA-C Follow up on 11/12/2021.   Specialties: Orthopedic Surgery, Emergency Medicine Why: 9:45am For staple removal and skin check Contact information: [address] [place] [address] [phone]                Allergies  Allergen Reactions   Aripiprazole Other (See Comments)   Naproxen Other (See Comments)   Flexeril [Cyclobenzaprine]    Gabapentin     Increased acathisia   Benadryl [Diphenhydramine Hcl (Sleep)]    Omeprazole Diarrhea  Robaxin [Methocarbamol] Rash   Simvastatin     Consultations: Ortho surg ICU  Cardio    Procedures/Studies: DG Knee 1-2 Views Right  Result Date: 10/25/2021 CLINICAL DATA:  Right knee pain EXAM: RIGHT KNEE - 1-2 VIEW COMPARISON:  None Available. FINDINGS: No evidence of fracture, dislocation, or joint effusion. No evidence of significant arthropathy or other focal bone abnormality. Soft tissues are unremarkable. IMPRESSION: No acute osseous abnormality identified. Electronically Signed   By: Jannifer Hick M.D.   On: 10/25/2021 16:30   DG Ankle 2 Views Right  Result Date: 10/29/2021 CLINICAL DATA:  ORIF right ankle. EXAM: RIGHT ANKLE - 2 VIEW COMPARISON:  None Available. FINDINGS: Intraoperative fluoroscopic images for right ankle ORIF. Plate and screw fixation of the distal fibula and a single cancellous screw through the medial malleolus. Total fluoroscopic time was 2 minutes and 55 seconds. Total fluoroscopic dose was 6.321 mGy. IMPRESSION: Intraoperative utilization of fluoroscopy. Electronically Signed   By: Larose Hires D.O.   On: 10/29/2021  15:01   DG Ankle Complete Right  Result Date: 10/27/2021 CLINICAL DATA:  Bimalleolar fracture. EXAM: RIGHT ANKLE - COMPLETE 3+ VIEW COMPARISON:  Oct 24, 2021. FINDINGS: The right ankle has been casted and immobilized. There is continued presence of mildly displaced medial malleolar and distal fibular fractures. IMPRESSION: Continued presence of mildly displaced medial malleolar and distal fibular fractures. Electronically Signed   By: Lupita Raider M.D.   On: 10/27/2021 08:35   CT CHEST WO CONTRAST  Result Date: 10/24/2021 CLINICAL DATA:  Bradycardia, abnormal chest x-ray EXAM: CT CHEST WITHOUT CONTRAST TECHNIQUE: Multidetector CT imaging of the chest was performed following the standard protocol without IV contrast. RADIATION DOSE REDUCTION: This exam was performed according to the departmental dose-optimization program which includes automated exposure control, adjustment of the mA and/or kV according to patient size and/or use of iterative reconstruction technique. COMPARISON:  10/24/2021, 08/07/2016 FINDINGS: Cardiovascular: Unenhanced imaging of the heart and great vessels demonstrates no pericardial effusion. 4 cm ascending thoracic aortic aneurysm unchanged. Evaluation of the vascular lumen is limited without IV contrast. Cardiac pacer from inferior approach, lead terminates within the right ventricle. Mediastinum/Nodes: Patient is intubated, endotracheal tube terminating well above carina. Enteric catheter extends into the gastric lumen. 3.2 cm left lobe thyroid nodules unchanged since 2018. No pathologic adenopathy within the mediastinum or hila. Lungs/Pleura: Dependent atelectasis seen bilaterally. No airspace disease, effusion, or pneumothorax. Minimal mucoid material within the mainstem bronchi. Upper Abdomen: No acute abnormality. Musculoskeletal: No acute or destructive bony lesions. Reconstructed images demonstrate no additional findings. IMPRESSION: 1. Support devices as above. 2. Dependent  hypoventilatory changes bilaterally. No acute airspace disease. 3. 4 cm ascending thoracic aortic aneurysm, unchanged. Recommend annual imaging followup by CTA or MRA. This recommendation follows 2010 ACCF/AHA/AATS/ACR/ASA/SCA/SCAI/SIR/STS/SVM Guidelines for the Diagnosis and Management of Patients with Thoracic Aortic Disease. Circulation. 2010; 121: W098-J191. Aortic aneurysm NOS (ICD10-I71.9) Electronically Signed   By: Sharlet Salina M.D.   On: 10/24/2021 19:27   CARDIAC CATHETERIZATION  Result Date: 10/24/2021   LV end diastolic pressure is severely elevated. Successful placement of Temporary Transvenous Pacemaker via 6 French RFV 23 cm sheath -> with loop and RA, of the catheter was advanced to 78-79 cm. => Rate set at 60 bpm, 5 MA with threshold of 1 MA.  Initially set at 70 bpm, reduced to 60. (Patient actually had a intrinsic rhythm upon completion of procedure) Angiographically Normal Coronary Arteries Relatively preserved LVEF (estimated 45 to 50%, with global hypokinesis) EDP  of 28-30 mmHg (40 mg IV Lasix administered) RECOMMENDATIONS Continue evaluation for nonischemic etiology for complete heart block EP consultation Continue to Manage Diastolic Heart Failure   DG Chest Port 1 View  Result Date: 10/24/2021 CLINICAL DATA:  Bradycardia. EXAM: PORTABLE CHEST 1 VIEW COMPARISON:  Oct 24, 2021 (1:51 p.m.) FINDINGS: Multiple overlying cardiac lead wires are seen. There is stable endotracheal tube and nasogastric tube positioning. The heart size and mediastinal contours are within normal limits. Low lung volumes are seen with mild areas of bibasilar scarring and/or atelectasis. Interval improvement of the previously noted pulmonary vascular congestion is seen. There is no evidence of a pleural effusion or pneumothorax. The visualized skeletal structures are unremarkable. IMPRESSION: 1. Low lung volumes with mild bibasilar scarring and/or atelectasis. 2. Interval improvement of the previously noted  pulmonary vascular congestion. Electronically Signed   By: Aram Candela M.D.   On: 10/24/2021 17:34   DG Chest Portable 1 View  Result Date: 10/24/2021 CLINICAL DATA:  Found unresponsive. EXAM: PORTABLE CHEST 1 VIEW COMPARISON:  09/13/2021 FINDINGS: 1351 hours. Endotracheal tube tip is approximately 2 cm above the base of the carina. The NG tube passes into the stomach although the distal tip position is not included on the film. Low lung volumes accentuate the cardiomediastinal silhouette. Pulmonary vascular congestion evident with possible interstitial edema. No substantial pleural effusion. Telemetry leads overlie the chest. IMPRESSION: 1. Endotracheal tube tip is approximately 2 cm above the base of the carina. 2. Low volume film accentuates the cardiopericardial silhouette. There is prominent opacity in the superior mediastinum with mediastinal widening, new since the study from about 5 weeks ago. This may be related to low volumes and positioning, but CT chest recommended to further evaluate 3. Pulmonary vascular congestion with possible interstitial edema. Electronically Signed   By: Kennith Center M.D.   On: 10/24/2021 14:11   DG Tibia/Fibula Right Port  Result Date: 10/24/2021 CLINICAL DATA:  Fall.  Right lower extremity bruising and pain. EXAM: PORTABLE RIGHT TIBIA AND FIBULA - 2 VIEW COMPARISON:  None Available. FINDINGS: Fracture of the medial malleolus at the ankle joint. Probable fracture of the distal fibula, not well-defined. No other fractures.  No bone lesions. Knee and ankle joints are normally aligned. Diffuse soft tissue edema. IMPRESSION: 1. Ankle fractures including a fracture across the base of the medial malleolus and a suspected distal fibular fracture. These will be further assessed on the ankle radiographs. 2. No other fractures. No dislocation. Diffuse soft tissue swelling. Electronically Signed   By: Amie Portland M.D.   On: 10/24/2021 18:04   DG Ankle Right Port  Result  Date: 10/24/2021 CLINICAL DATA:  Larey Seat.  Pain and bruising. EXAM: PORTABLE RIGHT ANKLE - 2 VIEW COMPARISON:  None Available. FINDINGS: Limited examination due to the patient's condition and limited positioning. There appears to be a transverse fracture of the medial malleolus. No obvious fibular fracture. The mid and hindfoot bony structures are intact. Diffuse soft tissue swelling is noted. IMPRESSION: 1. Limited examination due to the patient's condition and limited positioning. 2. Suspect transverse fracture of the medial malleolus. Recommend follow-up dedicated ankle films when possible. Electronically Signed   By: Rudie Meyer M.D.   On: 10/24/2021 18:05   DG C-Arm 1-60 Min-No Report  Result Date: 10/29/2021 Fluoroscopy was utilized by the requesting physician.  No radiographic interpretation.   [place] OR NERVE BLOCK-IMAGE ONLY [facility])  Result Date: 10/29/2021 There is no interpretation for this exam.  This order is for images  obtained during a surgical procedure.  Please See "Surgeries" Tab for more information regarding the procedure.   (Echo, Carotid, EGD, Colonoscopy, ERCP)    Subjective: Pt c/o malaise    Discharge Exam: Vitals:   11/03/21 0500 11/03/21 0815  BP:  (!) 139/93  Pulse:  71  Resp: 20 18  Temp:  98.4 F (36.9 C)  SpO2:  97%   Vitals:   11/02/21 2308 11/03/21 0419 11/03/21 0500 11/03/21 0815  BP: (!) 159/84 (!) 153/74  (!) 139/93  Pulse: 67   71  Resp: 18 18 20 18   Temp: 97.9 F (36.6 C) 98.7 F (37.1 C)  98.4 F (36.9 C)  TempSrc:  Oral    SpO2: 97% 96%  97%  Weight:  (!) 152.5 kg    Height:        General: Pt is alert, awake, not in acute distress Cardiovascular: S1/S2 +, no rubs, no gallops Respiratory: decreased breath sounds b/l otherwise clear  Abdominal: Soft, NT, obese, bowel sounds + Extremities: no cyanosis    The results of significant diagnostics from this hospitalization (including imaging, microbiology, ancillary and laboratory) are listed  below for reference.     Microbiology: Recent Results (from the past 240 hour(s))  Resp Panel by RT-PCR (Flu A&B, Covid) Nasopharyngeal Swab     Status: None   Collection Time: 10/24/21  1:35 PM   Specimen: Nasopharyngeal Swab; Nasopharyngeal(NP) swabs in vial transport medium  Result Value Ref Range Status   SARS Coronavirus 2 by RT PCR NEGATIVE NEGATIVE Final    Comment: (NOTE) SARS-CoV-2 target nucleic acids are NOT DETECTED.  The SARS-CoV-2 RNA is generally detectable in upper respiratory specimens during the acute phase of infection. The lowest concentration of SARS-CoV-2 viral copies this assay can detect is 138 copies/mL. A negative result does not preclude SARS-Cov-2 infection and should not be used as the sole basis for treatment or other patient management decisions. A negative result may occur with  improper specimen collection/handling, submission of specimen other than nasopharyngeal swab, presence of viral mutation(s) within the areas targeted by this assay, and inadequate number of viral copies(<138 copies/mL). A negative result must be combined with clinical observations, patient history, and epidemiological information. The expected result is Negative.  Fact Sheet for Patients:  10/26/21  Fact Sheet for Healthcare Providers:  BloggerCourse.com  This test is no t yet approved or cleared by the [employer] FDA and  has been authorized for detection and/or diagnosis of SARS-CoV-2 by FDA under an Emergency Use Authorization (EUA). This EUA will remain  in effect (meaning this test can be used) for the duration of the COVID-19 declaration under Section 564(b)(1) of the Act, 21 U.S.C.section 360bbb-3(b)(1), unless the authorization is terminated  or revoked sooner.       Influenza A by PCR NEGATIVE NEGATIVE Final   Influenza B by PCR NEGATIVE NEGATIVE Final    Comment: (NOTE) The Xpert Xpress  SARS-CoV-2/FLU/RSV plus assay is intended as an aid in the diagnosis of influenza from Nasopharyngeal swab specimens and should not be used as a sole basis for treatment. Nasal washings and aspirates are unacceptable for Xpert Xpress SARS-CoV-2/FLU/RSV testing.  Fact Sheet for Patients: [place]  Fact Sheet for Healthcare Providers: BloggerCourse.com  This test is not yet approved or cleared by the [employer].[place] FDA and has been authorized for detection and/or diagnosis of SARS-CoV-2 by FDA under an Emergency Use Authorization (EUA). This EUA will remain in effect (meaning this test can be used)  for the duration of the COVID-19 declaration under Section 564(b)(1) of the Act, 21 U.S.C. section 360bbb-3(b)(1), unless the authorization is terminated or revoked.  Performed at [facility], [address] [place], [place] [address]   MRSA Next Gen by PCR, Nasal     Status: None   Collection Time: 10/24/21  4:45 PM   Specimen: Nasal Mucosa; Nasal Swab  Result Value Ref Range Status   MRSA by PCR Next Gen NOT DETECTED NOT DETECTED Final    Comment: (NOTE) The GeneXpert MRSA Assay (FDA approved for NASAL specimens only), is one component of a comprehensive MRSA colonization surveillance program. It is not intended to diagnose MRSA infection nor to guide or monitor treatment for MRSA infections. Test performance is not FDA approved in patients less than 67 years old. Performed at [facility], [address] [place], [place] [address]   Blood culture (routine x 2)     Status: None   Collection Time: 10/24/21  5:49 PM   Specimen: BLOOD  Result Value Ref Range Status   Specimen Description BLOOD BOTTLES DRAWN AEROBIC AND ANAEROBIC  Final   Special Requests   Final    Blood Culture results may not be optimal due to an inadequate volume of blood received in culture bottles RIGHT ANTECUBITAL   Culture   Final     NO GROWTH 5 DAYS Performed at [facility], [address] [place], [place] [address]    Report Status 10/29/2021 FINAL  Final  Blood culture (routine x 2)     Status: None   Collection Time: 10/24/21  5:51 PM   Specimen: BLOOD  Result Value Ref Range Status   Specimen Description BLOOD BOTTLES DRAWN AEROBIC AND ANAEROBIC  Final   Special Requests   Final    Blood Culture results may not be optimal due to an inadequate volume of blood received in culture bottles LEFT ANTECUBITAL   Culture   Final    NO GROWTH 5 DAYS Performed at [facility], [address] [place], [place] [address]    Report Status 10/29/2021 FINAL  Final  Respiratory (~20 pathogens) panel by PCR     Status: Abnormal   Collection Time: 10/24/21 10:30 PM   Specimen: Nasopharyngeal Swab; Respiratory  Result Value Ref Range Status   Adenovirus NOT DETECTED NOT DETECTED Final   Coronavirus 229E NOT DETECTED NOT DETECTED Final    Comment: (NOTE) The Coronavirus on the Respiratory Panel, DOES NOT test for the novel  Coronavirus (2019 nCoV)    Coronavirus HKU1 NOT DETECTED NOT DETECTED Final   Coronavirus NL63 NOT DETECTED NOT DETECTED Final   Coronavirus OC43 NOT DETECTED NOT DETECTED Final   Metapneumovirus NOT DETECTED NOT DETECTED Final   Rhinovirus / Enterovirus DETECTED (A) NOT DETECTED Final   Influenza A NOT DETECTED NOT DETECTED Final   Influenza B NOT DETECTED NOT DETECTED Final   Parainfluenza Virus 1 NOT DETECTED NOT DETECTED Final   Parainfluenza Virus 2 NOT DETECTED NOT DETECTED Final   Parainfluenza Virus 3 NOT DETECTED NOT DETECTED Final   Parainfluenza Virus 4 NOT DETECTED NOT DETECTED Final   Respiratory Syncytial Virus NOT DETECTED NOT DETECTED Final   Bordetella pertussis NOT DETECTED NOT DETECTED Final   Bordetella Parapertussis NOT DETECTED NOT DETECTED Final   Chlamydophila pneumoniae NOT DETECTED NOT DETECTED Final   Mycoplasma pneumoniae NOT DETECTED NOT DETECTED  Final    Comment: Performed at [facility], [address]  [place], [place] [address]     Labs: BNP (last 3 results) Recent Labs    10/24/21 1320  BNP 27.4   Basic Metabolic Panel: Recent Labs  Lab 10/30/21 0637 10/31/21 0805 11/01/21 0419 11/02/21 0453 11/03/21 0751  NA 138 138 140 140 138  K 3.6 3.6 3.7 3.6 4.0  CL 103 102 105 103 102  CO2 GLUCOSE 121* 110* 107* 101* 109*  BUN CREATININE 0.87 0.83 0.83 0.84 0.79  CALCIUM 8.2* 8.6* 8.6* 8.9 8.8*  MG 2.1 2.1 2.2 2.2 2.2  PHOS 3.8 3.8 4.4 4.7* 4.3   Liver Function Tests: No results for input(s): AST, ALT, ALKPHOS, BILITOT, PROT, ALBUMIN in the last 168 hours. No results for input(s): LIPASE, AMYLASE in the last 168 hours. No results for input(s): AMMONIA in the last 168 hours. CBC: Recent Labs  Lab 10/30/21 0637 10/31/21 0805 11/01/21 0419 11/02/21 0453 11/03/21 0751  WBC 9.0 6.9 6.1 6.6 5.8  HGB 12.1* 12.0* 12.3* 12.2* 12.6*  HCT 36.1* 36.3* 37.2* 37.4* 37.4*  MCV 89.8 89.0 89.4 90.8 88.6  PLT 204 237 248 291 305   Cardiac Enzymes: No results for input(s): CKTOTAL, CKMB, CKMBINDEX, TROPONINI in the last 168 hours. BNP: Invalid input(s): POCBNP CBG: Recent Labs  Lab 11/02/21 0818 11/02/21 1221 11/02/21 1645 11/02/21 2009 11/03/21 0817  GLUCAP 128* 172* 127* 138* 123*   D-Dimer No results for input(s): DDIMER in the last 72 hours. Hgb A1c No results for input(s): HGBA1C in the last 72 hours. Lipid Profile No results for input(s): CHOL, HDL, LDLCALC, TRIG, CHOLHDL, LDLDIRECT in the last 72 hours. Thyroid function studies No results for input(s): TSH, T4TOTAL, T3FREE, THYROIDAB in the last 72 hours.  Invalid input(s): FREET3 Anemia work up No results for input(s): VITAMINB12, FOLATE, FERRITIN, TIBC, IRON, RETICCTPCT in the last 72 hours. Urinalysis    Component Value Date/Time   COLORURINE YELLOW (A) 10/24/2021 1303   APPEARANCEUR HAZY (A) 10/24/2021  1303   LABSPEC 1.012 10/24/2021 1303   PHURINE 6.0 10/24/2021 1303   GLUCOSEU 50 (A) 10/24/2021 1303   HGBUR NEGATIVE 10/24/2021 1303   BILIRUBINUR NEGATIVE 10/24/2021 1303   KETONESUR NEGATIVE 10/24/2021 1303   PROTEINUR 30 (A) 10/24/2021 1303   NITRITE NEGATIVE 10/24/2021 1303   LEUKOCYTESUR NEGATIVE 10/24/2021 1303   Sepsis Labs Invalid input(s): PROCALCITONIN,  WBC,  LACTICIDVEN Microbiology Recent Results (from the past 240 hour(s))  Resp Panel by RT-PCR (Flu A&B, Covid) Nasopharyngeal Swab     Status: None   Collection Time: 10/24/21  1:35 PM   Specimen: Nasopharyngeal Swab; Nasopharyngeal(NP) swabs in vial transport medium  Result Value Ref Range Status   SARS Coronavirus 2 by RT PCR NEGATIVE NEGATIVE Final    Comment: (NOTE) SARS-CoV-2 target nucleic acids are NOT DETECTED.  The SARS-CoV-2 RNA is generally detectable in upper respiratory specimens during the acute phase of infection. The lowest concentration of SARS-CoV-2 viral copies this assay can detect is 138 copies/mL. A negative result does not preclude SARS-Cov-2 infection and should not be used as the sole basis for treatment or other patient management decisions. A negative result may occur with  improper specimen collection/handling, submission of specimen other than nasopharyngeal swab, presence of viral mutation(s) within the areas targeted by this assay, and inadequate number of viral copies(<138 copies/mL). A negative result must be combined with clinical observations, patient history, and epidemiological information. The expected result is Negative.  Fact Sheet for Patients:  BloggerCourse.com  Fact Sheet for Healthcare Providers:  SeriousBroker.it  This test is no t yet approved or cleared by the [place] FDA and  has been authorized for detection and/or diagnosis of SARS-CoV-2 by FDA under an Emergency Use Authorization (EUA). This EUA will  remain  in effect (meaning this test can be used) for the duration of the COVID-19 declaration under Section 564(b)(1) of the Act, 21 U.S.C.section 360bbb-3(b)(1), unless the authorization is terminated  or revoked sooner.       Influenza A by PCR NEGATIVE NEGATIVE Final   Influenza B by PCR NEGATIVE NEGATIVE Final    Comment: (NOTE) The Xpert Xpress SARS-CoV-2/FLU/RSV plus assay is intended as an aid in the diagnosis of influenza from Nasopharyngeal swab specimens and should not be used as a sole basis for treatment. Nasal washings and aspirates are unacceptable for Xpert Xpress SARS-CoV-2/FLU/RSV testing.  Fact Sheet for Patients: BloggerCourse.com  Fact Sheet for Healthcare Providers: SeriousBroker.it  This test is not yet approved or cleared by the [place] FDA and has been authorized for detection and/or diagnosis of SARS-CoV-2 by FDA under an Emergency Use Authorization (EUA). This EUA will remain in effect (meaning this test can be used) for the duration of the COVID-19 declaration under Section 564(b)(1) of the Act, 21 U.S.C. section 360bbb-3(b)(1), unless the authorization is terminated or revoked.  Performed at [facility], [address] [place], [place] [address]   MRSA Next Gen by PCR, Nasal     Status: None   Collection Time: 10/24/21  4:45 PM   Specimen: Nasal Mucosa; Nasal Swab  Result Value Ref Range Status   MRSA by PCR Next Gen NOT DETECTED NOT DETECTED Final    Comment: (NOTE) The GeneXpert MRSA Assay (FDA approved for NASAL specimens only), is one component of a comprehensive MRSA colonization surveillance program. It is not intended to diagnose MRSA infection nor to guide or monitor treatment for MRSA infections. Test performance is not FDA approved in patients less than 59 years old. Performed at [facility], [address] [place], [place] [address]   Blood culture  (routine x 2)     Status: None   Collection Time: 10/24/21  5:49 PM   Specimen: BLOOD  Result Value Ref Range Status   Specimen Description BLOOD BOTTLES DRAWN AEROBIC AND ANAEROBIC  Final   Special Requests   Final    Blood Culture results may not be optimal due to an inadequate volume of blood received in culture bottles RIGHT ANTECUBITAL   Culture   Final    NO GROWTH 5 DAYS Performed at [facility], [address] [place], [place] [address]    Report Status 10/29/2021 FINAL  Final  Blood culture (routine x 2)     Status: None   Collection Time: 10/24/21  5:51 PM   Specimen: BLOOD  Result Value Ref Range Status   Specimen Description BLOOD BOTTLES DRAWN AEROBIC AND ANAEROBIC  Final   Special Requests   Final    Blood Culture results may not be optimal due to an inadequate volume of blood received in culture bottles LEFT ANTECUBITAL   Culture   Final    NO GROWTH 5 DAYS Performed at [facility], [address], [place], [place] [address]    Report Status 10/29/2021 FINAL  Final  Respiratory (~20 pathogens) panel by PCR     Status: Abnormal   Collection Time: 10/24/21 10:30 PM   Specimen: Nasopharyngeal Swab; Respiratory  Result Value Ref Range Status   Adenovirus NOT DETECTED NOT DETECTED Final   Coronavirus 229E NOT DETECTED NOT DETECTED Final    Comment: (NOTE) The Coronavirus on the Respiratory Panel, DOES NOT test for the novel  Coronavirus (2019 nCoV)    Coronavirus HKU1 NOT DETECTED NOT DETECTED Final   Coronavirus NL63 NOT DETECTED NOT DETECTED Final   Coronavirus OC43 NOT DETECTED NOT DETECTED Final   Metapneumovirus NOT DETECTED NOT DETECTED Final   Rhinovirus / Enterovirus DETECTED (A) NOT DETECTED Final   Influenza A NOT DETECTED NOT DETECTED Final   Influenza B NOT DETECTED NOT DETECTED Final   Parainfluenza Virus 1 NOT DETECTED NOT DETECTED Final   Parainfluenza Virus 2 NOT DETECTED NOT DETECTED Final   Parainfluenza Virus 3 NOT  DETECTED NOT DETECTED Final   Parainfluenza Virus 4 NOT DETECTED NOT DETECTED Final   Respiratory Syncytial Virus NOT DETECTED NOT DETECTED Final   Bordetella pertussis NOT DETECTED NOT DETECTED Final   Bordetella Parapertussis NOT DETECTED NOT DETECTED Final   Chlamydophila pneumoniae NOT DETECTED NOT DETECTED Final   Mycoplasma pneumoniae NOT DETECTED NOT DETECTED Final    Comment: Performed at [facility], [address] [place], [place] [address]     Time coordinating discharge: Over 30 minutes  SIGNED:   Charise Killian, MD  [facility] 11/03/2021, 12:18 PM Pager   If 7PM-7AM, please contact night-coverage

## 2021-11-03 NOTE — Care Management Important Message (Signed)
Important Message  Patient Details  Name: Jared Carey MRN: [id number] Date of Birth: Jun 19, 1955   Medicare Important Message Given:  Yes  Patient is in an isolation room so I reviewed the Important Message from Medicare by phone [phone]). He is in agreement with his discharge plan.  He said everyone had treated him awesome and this admission has been a great experience and I thanked him for the positive feedback.  I asked if he would like a copy of the form but he declined.  I wished him well and thanked him for his time.    Olegario Messier A Loreli Debruler 11/03/2021, 10:52 AM

## 2021-11-03 NOTE — Progress Notes (Signed)
Report called to [facility].   Discharge instructions (including medications) discussed with and copy provided to facility.    Pt transported by EMS

## 2021-11-04 DIAGNOSIS — J9601 Acute respiratory failure with hypoxia: Secondary | ICD-10-CM | POA: Diagnosis not present

## 2021-11-04 DIAGNOSIS — I455 Other specified heart block: Secondary | ICD-10-CM | POA: Diagnosis not present

## 2021-11-04 DIAGNOSIS — S82841A Displaced bimalleolar fracture of right lower leg, initial encounter for closed fracture: Secondary | ICD-10-CM | POA: Diagnosis not present

## 2021-11-04 DIAGNOSIS — M6281 Muscle weakness (generalized): Secondary | ICD-10-CM | POA: Diagnosis not present

## 2021-11-04 DIAGNOSIS — I10 Essential (primary) hypertension: Secondary | ICD-10-CM | POA: Diagnosis not present

## 2021-11-10 ENCOUNTER — Encounter: Payer: Self-pay | Admitting: Orthopedic Surgery

## 2021-11-11 DIAGNOSIS — I10 Essential (primary) hypertension: Secondary | ICD-10-CM | POA: Diagnosis not present

## 2021-11-11 DIAGNOSIS — G4709 Other insomnia: Secondary | ICD-10-CM | POA: Diagnosis not present

## 2021-11-11 DIAGNOSIS — S82841A Displaced bimalleolar fracture of right lower leg, initial encounter for closed fracture: Secondary | ICD-10-CM | POA: Diagnosis not present

## 2021-11-11 DIAGNOSIS — J9601 Acute respiratory failure with hypoxia: Secondary | ICD-10-CM | POA: Diagnosis not present

## 2021-11-11 DIAGNOSIS — I455 Other specified heart block: Secondary | ICD-10-CM | POA: Diagnosis not present

## 2021-11-11 DIAGNOSIS — J302 Other seasonal allergic rhinitis: Secondary | ICD-10-CM | POA: Diagnosis not present

## 2021-11-11 DIAGNOSIS — M6281 Muscle weakness (generalized): Secondary | ICD-10-CM | POA: Diagnosis not present

## 2021-11-12 DIAGNOSIS — Z9889 Other specified postprocedural states: Secondary | ICD-10-CM | POA: Diagnosis not present

## 2021-11-12 DIAGNOSIS — Z8781 Personal history of (healed) traumatic fracture: Secondary | ICD-10-CM | POA: Diagnosis not present

## 2021-11-13 ENCOUNTER — Other Ambulatory Visit: Payer: Self-pay | Admitting: *Deleted

## 2021-11-13 DIAGNOSIS — M6281 Muscle weakness (generalized): Secondary | ICD-10-CM | POA: Diagnosis not present

## 2021-11-13 DIAGNOSIS — R197 Diarrhea, unspecified: Secondary | ICD-10-CM | POA: Diagnosis not present

## 2021-11-13 DIAGNOSIS — J9601 Acute respiratory failure with hypoxia: Secondary | ICD-10-CM | POA: Diagnosis not present

## 2021-11-13 DIAGNOSIS — I10 Essential (primary) hypertension: Secondary | ICD-10-CM | POA: Diagnosis not present

## 2021-11-13 DIAGNOSIS — I455 Other specified heart block: Secondary | ICD-10-CM | POA: Diagnosis not present

## 2021-11-13 DIAGNOSIS — S82841A Displaced bimalleolar fracture of right lower leg, initial encounter for closed fracture: Secondary | ICD-10-CM | POA: Diagnosis not present

## 2021-11-13 NOTE — Patient Outreach (Signed)
[facility] Post- Acute Care Coordinator follow up. Per [facility] eligible member currently resides in [facility].  Screened for potential [facility] care coordination services as a benefit of [employer] plan.  Member's PCP at [facility] has [facility] care coordination services.  Spoke with Deseree, SNF SW at [facility]. Deseree reports member is from home with spouse. Anticipated transition plan is to return home. States member had ortho follow up appointment. Deseree waiting to hear if there is a change in weight bearing status.   Telephone call made to [facility] (spouse/DPR). [name] states she is at the dentist. Writer to call back at later time.   Telephone call made to [name] [phone]. Patient identifiers confirmed.  Conversation brief due to poor cell reception in SNF. [name] [facility] and [facility] to [facility] Judithann Graves at [facility] if unable to get to [facility].   Will plan follow up at later time.    [name], MSN, RN,BSN [facility] Post Acute Care Coordinator [phone] [facility]) [phone]  (Toll free office)

## 2021-11-17 DIAGNOSIS — S82841A Displaced bimalleolar fracture of right lower leg, initial encounter for closed fracture: Secondary | ICD-10-CM | POA: Diagnosis not present

## 2021-11-17 DIAGNOSIS — I10 Essential (primary) hypertension: Secondary | ICD-10-CM | POA: Diagnosis not present

## 2021-11-17 DIAGNOSIS — I455 Other specified heart block: Secondary | ICD-10-CM | POA: Diagnosis not present

## 2021-11-17 DIAGNOSIS — M6281 Muscle weakness (generalized): Secondary | ICD-10-CM | POA: Diagnosis not present

## 2021-11-17 DIAGNOSIS — R197 Diarrhea, unspecified: Secondary | ICD-10-CM | POA: Diagnosis not present

## 2021-11-23 ENCOUNTER — Inpatient Hospital Stay
Admission: EM | Admit: 2021-11-23 | Discharge: 2021-11-25 | DRG: 229 | Disposition: A | Discharge status: Home Health | Payer: Medicare Other | Attending: Internal Medicine | Admitting: Internal Medicine

## 2021-11-23 ENCOUNTER — Emergency Department: Payer: Medicare Other

## 2021-11-23 ENCOUNTER — Other Ambulatory Visit: Payer: Self-pay

## 2021-11-23 DIAGNOSIS — S82891A Other fracture of right lower leg, initial encounter for closed fracture: Secondary | ICD-10-CM | POA: Diagnosis not present

## 2021-11-23 DIAGNOSIS — F988 Other specified behavioral and emotional disorders with onset usually occurring in childhood and adolescence: Secondary | ICD-10-CM | POA: Diagnosis present

## 2021-11-23 DIAGNOSIS — W182XXA Fall in (into) shower or empty bathtub, initial encounter: Secondary | ICD-10-CM | POA: Diagnosis present

## 2021-11-23 DIAGNOSIS — Z818 Family history of other mental and behavioral disorders: Secondary | ICD-10-CM

## 2021-11-23 DIAGNOSIS — R001 Bradycardia, unspecified: Secondary | ICD-10-CM | POA: Diagnosis present

## 2021-11-23 DIAGNOSIS — S0181XA Laceration without foreign body of other part of head, initial encounter: Secondary | ICD-10-CM | POA: Diagnosis not present

## 2021-11-23 DIAGNOSIS — Z803 Family history of malignant neoplasm of breast: Secondary | ICD-10-CM | POA: Diagnosis not present

## 2021-11-23 DIAGNOSIS — Z833 Family history of diabetes mellitus: Secondary | ICD-10-CM

## 2021-11-23 DIAGNOSIS — I10 Essential (primary) hypertension: Secondary | ICD-10-CM | POA: Diagnosis present

## 2021-11-23 DIAGNOSIS — I441 Atrioventricular block, second degree: Principal | ICD-10-CM | POA: Diagnosis present

## 2021-11-23 DIAGNOSIS — I5032 Chronic diastolic (congestive) heart failure: Secondary | ICD-10-CM | POA: Diagnosis not present

## 2021-11-23 DIAGNOSIS — W1830XD Fall on same level, unspecified, subsequent encounter: Secondary | ICD-10-CM | POA: Diagnosis not present

## 2021-11-23 DIAGNOSIS — S82841D Displaced bimalleolar fracture of right lower leg, subsequent encounter for closed fracture with routine healing: Secondary | ICD-10-CM | POA: Diagnosis not present

## 2021-11-23 DIAGNOSIS — Z820 Family history of epilepsy and other diseases of the nervous system: Secondary | ICD-10-CM | POA: Diagnosis not present

## 2021-11-23 DIAGNOSIS — G4489 Other headache syndrome: Secondary | ICD-10-CM | POA: Diagnosis not present

## 2021-11-23 DIAGNOSIS — I5030 Unspecified diastolic (congestive) heart failure: Secondary | ICD-10-CM | POA: Diagnosis not present

## 2021-11-23 DIAGNOSIS — E162 Hypoglycemia, unspecified: Secondary | ICD-10-CM | POA: Diagnosis not present

## 2021-11-23 DIAGNOSIS — S0003XA Contusion of scalp, initial encounter: Secondary | ICD-10-CM | POA: Diagnosis not present

## 2021-11-23 DIAGNOSIS — Z800 Family history of malignant neoplasm of digestive organs: Secondary | ICD-10-CM

## 2021-11-23 DIAGNOSIS — Z79899 Other long term (current) drug therapy: Secondary | ICD-10-CM

## 2021-11-23 DIAGNOSIS — K219 Gastro-esophageal reflux disease without esophagitis: Secondary | ICD-10-CM | POA: Diagnosis present

## 2021-11-23 DIAGNOSIS — Z6841 Body Mass Index (BMI) 40.0 and over, adult: Secondary | ICD-10-CM

## 2021-11-23 DIAGNOSIS — Z886 Allergy status to analgesic agent status: Secondary | ICD-10-CM

## 2021-11-23 DIAGNOSIS — Z7401 Bed confinement status: Secondary | ICD-10-CM | POA: Diagnosis not present

## 2021-11-23 DIAGNOSIS — R570 Cardiogenic shock: Secondary | ICD-10-CM | POA: Diagnosis not present

## 2021-11-23 DIAGNOSIS — I110 Hypertensive heart disease with heart failure: Secondary | ICD-10-CM | POA: Diagnosis present

## 2021-11-23 DIAGNOSIS — Z743 Need for continuous supervision: Secondary | ICD-10-CM | POA: Diagnosis not present

## 2021-11-23 DIAGNOSIS — R55 Syncope and collapse: Secondary | ICD-10-CM | POA: Diagnosis not present

## 2021-11-23 DIAGNOSIS — Z950 Presence of cardiac pacemaker: Secondary | ICD-10-CM | POA: Diagnosis not present

## 2021-11-23 DIAGNOSIS — Z87891 Personal history of nicotine dependence: Secondary | ICD-10-CM | POA: Diagnosis not present

## 2021-11-23 DIAGNOSIS — F324 Major depressive disorder, single episode, in partial remission: Secondary | ICD-10-CM | POA: Diagnosis not present

## 2021-11-23 DIAGNOSIS — I5022 Chronic systolic (congestive) heart failure: Secondary | ICD-10-CM | POA: Diagnosis present

## 2021-11-23 DIAGNOSIS — Z86010 Personal history of colonic polyps: Secondary | ICD-10-CM | POA: Diagnosis not present

## 2021-11-23 DIAGNOSIS — Z006 Encounter for examination for normal comparison and control in clinical research program: Secondary | ICD-10-CM | POA: Diagnosis not present

## 2021-11-23 DIAGNOSIS — Y92012 Bathroom of single-family (private) house as the place of occurrence of the external cause: Secondary | ICD-10-CM | POA: Diagnosis not present

## 2021-11-23 DIAGNOSIS — Z888 Allergy status to other drugs, medicaments and biological substances status: Secondary | ICD-10-CM

## 2021-11-23 DIAGNOSIS — N3281 Overactive bladder: Secondary | ICD-10-CM | POA: Diagnosis present

## 2021-11-23 DIAGNOSIS — I459 Conduction disorder, unspecified: Secondary | ICD-10-CM

## 2021-11-23 DIAGNOSIS — R404 Transient alteration of awareness: Secondary | ICD-10-CM | POA: Diagnosis not present

## 2021-11-23 DIAGNOSIS — W19XXXA Unspecified fall, initial encounter: Principal | ICD-10-CM

## 2021-11-23 DIAGNOSIS — F32A Depression, unspecified: Secondary | ICD-10-CM | POA: Diagnosis present

## 2021-11-23 DIAGNOSIS — Z8052 Family history of malignant neoplasm of bladder: Secondary | ICD-10-CM | POA: Diagnosis not present

## 2021-11-23 DIAGNOSIS — I499 Cardiac arrhythmia, unspecified: Secondary | ICD-10-CM | POA: Diagnosis not present

## 2021-11-23 DIAGNOSIS — I4510 Unspecified right bundle-branch block: Secondary | ICD-10-CM | POA: Diagnosis not present

## 2021-11-23 DIAGNOSIS — Z8249 Family history of ischemic heart disease and other diseases of the circulatory system: Secondary | ICD-10-CM | POA: Diagnosis not present

## 2021-11-23 DIAGNOSIS — N179 Acute kidney failure, unspecified: Secondary | ICD-10-CM | POA: Diagnosis not present

## 2021-11-23 DIAGNOSIS — E161 Other hypoglycemia: Secondary | ICD-10-CM | POA: Diagnosis not present

## 2021-11-23 DIAGNOSIS — M4312 Spondylolisthesis, cervical region: Secondary | ICD-10-CM | POA: Diagnosis not present

## 2021-11-23 LAB — BASIC METABOLIC PANEL
Anion gap: 10 (ref 5–15)
BUN: 13 mg/dL (ref 8–23)
CO2: 25 mmol/L (ref 22–32)
Calcium: 9.5 mg/dL (ref 8.9–10.3)
Chloride: 101 mmol/L (ref 98–111)
Creatinine, Ser: 0.92 mg/dL (ref 0.61–1.24)
GFR, Estimated: >60 mL/min (ref >60)
Glucose, Bld: 112 mg/dL — ABNORMAL HIGH (ref 70–99)
Potassium: 4.5 mmol/L (ref 3.5–5.1)
Sodium: 136 mmol/L (ref 135–145)

## 2021-11-23 LAB — CBC
HCT: 48.3 % (ref 39.0–52.0)
Hemoglobin: 15.9 g/dL (ref 13.0–17.0)
MCH: 29.1 pg (ref 26.0–34.0)
MCHC: 32.9 g/dL (ref 30.0–36.0)
MCV: 88.3 fL (ref 80.0–100.0)
Platelets: 210 10*3/uL (ref 150–400)
RBC: 5.47 MIL/uL (ref 4.22–5.81)
RDW: 12.3 % (ref 11.5–15.5)
WBC: 9.3 10*3/uL (ref 4.0–10.5)
nRBC: 0 % (ref 0.0–0.2)

## 2021-11-23 LAB — TROPONIN I (HIGH SENSITIVITY)
Troponin I (High Sensitivity): 6 ng/L (ref <18)
Troponin I (High Sensitivity): 6 ng/L (ref <18)

## 2021-11-23 LAB — MAGNESIUM: Magnesium: 2 mg/dL (ref 1.7–2.4)

## 2021-11-23 LAB — BRAIN NATRIURETIC PEPTIDE: B Natriuretic Peptide: 14.3 pg/mL (ref 0.0–100.0)

## 2021-11-23 LAB — TSH: TSH: 1.517 u[IU]/mL (ref 0.350–4.500)

## 2021-11-23 MED ORDER — ALBUTEROL SULFATE (2.5 MG/3ML) 0.083% IN NEBU: 3.0000 mL | INHALATION_SOLUTION | Freq: Four times a day (QID) | RESPIRATORY_TRACT | Status: DC | PRN

## 2021-11-23 MED ORDER — VENLAFAXINE HCL ER 75 MG PO CP24
150.0000 mg | ORAL_CAPSULE | Freq: Every day | ORAL | Status: DC
  Administered 2021-11-25: 150 mg via ORAL
  Filled 2021-11-23 (×2): qty 2
  Filled 2021-11-23: qty 1

## 2021-11-23 MED ORDER — OXYBUTYNIN CHLORIDE ER 10 MG PO TB24
20.0000 mg | ORAL_TABLET | Freq: Two times a day (BID) | ORAL | Status: DC
  Administered 2021-11-24 – 2021-11-25 (×3): 20 mg via ORAL
  Filled 2021-11-23 (×4): qty 2

## 2021-11-23 MED ORDER — CEFAZOLIN IN SODIUM CHLORIDE 3-0.9 GM/100ML-% IV SOLN: 3.0000 g | INTRAVENOUS | Status: DC

## 2021-11-23 MED ORDER — AMLODIPINE BESYLATE 10 MG PO TABS
10.0000 mg | ORAL_TABLET | Freq: Every day | ORAL | Status: DC
  Administered 2021-11-24 – 2021-11-25 (×2): 10 mg via ORAL
  Filled 2021-11-23 (×2): qty 1

## 2021-11-23 MED ORDER — CARBAMAZEPINE 200 MG PO TABS
200.0000 mg | ORAL_TABLET | Freq: Three times a day (TID) | ORAL | Status: DC
  Administered 2021-11-23 – 2021-11-25 (×7): 200 mg via ORAL
  Filled 2021-11-23 (×8): qty 1

## 2021-11-23 MED ORDER — FLUTICASONE PROPIONATE 50 MCG/ACT NA SUSP
1.0000 | Freq: Every day | NASAL | Status: DC
  Administered 2021-11-24 – 2021-11-25 (×2): 1 via NASAL
  Filled 2021-11-23: qty 16

## 2021-11-23 MED ORDER — ADULT MULTIVITAMIN W/MINERALS CH
2.0000 | ORAL_TABLET | Freq: Every day | ORAL | Status: DC
  Administered 2021-11-24 – 2021-11-25 (×2): 2 via ORAL
  Filled 2021-11-23 (×2): qty 2

## 2021-11-23 MED ORDER — CLONAZEPAM 0.5 MG PO TABS
0.5000 mg | ORAL_TABLET | Freq: Two times a day (BID) | ORAL | Status: DC
  Administered 2021-11-23 – 2021-11-25 (×4): 0.5 mg via ORAL
  Filled 2021-11-23 (×4): qty 1

## 2021-11-23 MED ORDER — HYDROCODONE-ACETAMINOPHEN 5-325 MG PO TABS
1.0000 | ORAL_TABLET | Freq: Three times a day (TID) | ORAL | Status: DC | PRN
  Administered 2021-11-23 – 2021-11-24 (×3): 1 via ORAL
  Filled 2021-11-23 (×3): qty 1

## 2021-11-23 MED ORDER — ONDANSETRON HCL 4 MG/2ML IJ SOLN: 4.0000 mg | Freq: Four times a day (QID) | INTRAMUSCULAR | Status: DC | PRN

## 2021-11-23 MED ORDER — SODIUM CHLORIDE 0.9% FLUSH
3.0000 mL | Freq: Two times a day (BID) | INTRAVENOUS | Status: DC
  Administered 2021-11-24 – 2021-11-25 (×3): 3 mL via INTRAVENOUS

## 2021-11-23 MED ORDER — ONDANSETRON HCL 4 MG PO TABS: 4.0000 mg | ORAL_TABLET | Freq: Four times a day (QID) | ORAL | Status: DC | PRN

## 2021-11-23 MED ORDER — TRAZODONE HCL 100 MG PO TABS
100.0000 mg | ORAL_TABLET | Freq: Every day | ORAL | Status: DC
  Administered 2021-11-23 – 2021-11-24 (×2): 100 mg via ORAL
  Filled 2021-11-23 (×2): qty 1

## 2021-11-23 MED ORDER — LIDOCAINE-EPINEPHRINE 2 %-1:100000 IJ SOLN
20.0000 mL | Freq: Once | INTRAMUSCULAR | Status: AC
  Administered 2021-11-23: 20 mL via INTRADERMAL
  Filled 2021-11-23: qty 1

## 2021-11-23 MED ORDER — SODIUM CHLORIDE 0.9% FLUSH
3.0000 mL | Freq: Two times a day (BID) | INTRAVENOUS | Status: DC
  Administered 2021-11-23 – 2021-11-25 (×5): 3 mL via INTRAVENOUS

## 2021-11-23 NOTE — ED Triage Notes (Signed)
Pt here from [facility] after an syncopal episode in the shower after standing. Pt broke his foot with the last fall. Pt HR in the 40s as well. Pt has gash on forehead from fall, pt not on blood thinners.   157/64 122-cbg 95% RA

## 2021-11-23 NOTE — Assessment & Plan Note (Addendum)
Patient has a BMI of 44.46 Complicates overall prognosis and care Lifestyle modification and exercise has been discussed in detail. 

## 2021-11-23 NOTE — Assessment & Plan Note (Addendum)
History of right ankle fracture following a prior syncopal episode Status post ORIF which was done on 10/29/21 Patient remains nonweightbearing on his right lower extremity Pain medication as needed Follow-up with orthopedic surgery as an outpatient.

## 2021-11-23 NOTE — Assessment & Plan Note (Addendum)
Patient presents to the ER for evaluation after he had a syncopal episode with a laceration of his forehead. EKG showed second-degree AV block with 2-1 AV conduction. Status post permanent pacemaker placement today. Cardiology will evaluate right groin site tomorrow and remove the sutures.  Potential discharge tomorrow if he is doing well

## 2021-11-23 NOTE — Assessment & Plan Note (Addendum)
-   Continue amlodipine ?

## 2021-11-23 NOTE — H&P (Signed)
History and Physical    Patient: Jared Carey:[id number] DOB: 1955-10-02 DOA: 11/23/2021 DOS: the patient was seen and examined on 11/23/2021 PCP: Nada Boozer, MD  Patient coming from: Home  Chief Complaint:  Chief Complaint  Patient presents with   Loss of Consciousness   HPI: Jared Carey is a 66 y.o. male with medical history significant for depression, anxiety, morbid obesity (BMI 44.46), recent admission for symptomatic bradycardia for complete heart block s/p temporary pacemaker insertion and cardiac cath which showed normal coronaries in 05/23, LVEF 45 - 50% with global hypokinesis, history of acute hypoxic and hypercapnic respiratory failure s/p mechanical intubation, history of right ankle fracture following a syncopal episode status post ORIF of his right ankle on 10/29/21 who presents to the ER from the rehab where he has been following his hospital discharge in May, 2023 for evaluation of a syncopal episode. Patient states that he was taking a shower when he suddenly felt dizzy and lightheaded so he sat down on the shower chair and then passed out hitting his head.  He was brought into the ER and had a laceration over his forehead requiring sutures.  Twelve-lead EKG showed second-degree AV block with heart rate in the 40s. Patient states that he has had at least 8 syncopal episodes over the last 6 weeks.  During his last hospitalization his propranolol due to symptomatic bradycardia was discontinued but he has continued to have syncopal episodes. He denies having any chest pain, no shortness of breath, no nausea, no vomiting, no changes in his bowel habits, no urinary symptoms, no headache, no fever, no chills, no dizziness, no lightheadedness, no focal deficits or blurred vision.   Review of Systems: As mentioned in the history of present illness. All other systems reviewed and are negative. Past Medical History:  Diagnosis Date   ADD (attention deficit disorder)     Akathisia 01/18/2019   Allergy    Anemia    Anxiety    Barrett esophagus    Chest pain 08/07/2016   Colon polyps    Depression    Diverticulosis    Dysphagia    Edema    Gait abnormality 03/09/2021   GERD (gastroesophageal reflux disease)    Hypertension    Incontinence    urge   Major depressive disorder    Obesity    Obesity    Occipital neuralgia 03/09/2021   Bilateral   Osteoarthritis of thumbs, bilateral    Overactive bladder    Renal infarction (HCC)    Tardive akathisia    Tardive dyskinesia    Thoracic stomach hernia    Thyroid disease    Thyroid nodule    benign   Venous insufficiency    Past Surgical History:  Procedure Laterality Date   BIOPSY THYROID  2018   benign   COLONOSCOPY  09/2017   [facility]   ESOPHAGOGASTRODUODENOSCOPY  09/2017   [facility]   INGUINAL HERNIA REPAIR Right 1982   LAPAROSCOPIC CHOLECYSTECTOMY  2002   LEFT HEART CATH AND CORONARY ANGIOGRAPHY N/A 10/24/2021   Procedure: LEFT HEART CATH AND CORONARY ANGIOGRAPHY;  Surgeon: Marykay Lex, MD;  Location: [facility] INVASIVE CV LAB;  Service: Cardiovascular;  Laterality: N/A;   LUMBAR DISC SURGERY  1999   L5-S1 fusion   NERVE AND TENDON REPAIR Left 1998   left elbow   ORIF ANKLE FRACTURE Right 10/29/2021   Procedure: OPEN REDUCTION INTERNAL FIXATION (ORIF) ANKLE FRACTURE;  Surgeon: Kennedy Bucker, MD;  Location: [facility] ORS;  Service: Orthopedics;  Laterality: Right;   TEMPORARY PACEMAKER N/A 10/24/2021   Procedure: TEMPORARY PACEMAKER;  Surgeon: Marykay Lex, MD;  Location: [facility] INVASIVE CV LAB;  Service: Cardiovascular;  Laterality: N/A;   TONSILLECTOMY  1960   Social History:  reports that he quit smoking about 46 years ago. His smoking use included cigarettes. He quit smokeless tobacco use about 46 years ago.  His smokeless tobacco use included chew. He reports that he does not currently use alcohol. He reports that he does not currently use drugs after having used the following drugs:  Marijuana.  Allergies  Allergen Reactions   Aripiprazole Other (See Comments)   Naproxen Other (See Comments)   Flexeril [Cyclobenzaprine]    Gabapentin     Increased acathisia   Benadryl [Diphenhydramine Hcl (Sleep)]    Omeprazole Diarrhea   Robaxin [Methocarbamol] Rash   Simvastatin     Family History  Problem Relation Age of Onset   Pneumonia Mother    Mitral valve prolapse Mother    Parkinsonism Mother    Diabetes Mother    Depression Mother    Hyperthyroidism Mother    Heart attack Father    Bladder Cancer Father    Hyperthyroidism Father    Heart disease Father    Breast cancer Sister    Colon cancer Brother     Prior to Admission medications   Medication Sig Start Date End Date Taking? Authorizing Provider  albuterol (PROVENTIL HFA;VENTOLIN HFA) 108 (90 BASE) MCG/ACT inhaler Inhale 2 puffs into the lungs every 6 (six) hours as needed for wheezing or shortness of breath.    [provider]  amLODipine (NORVASC) 5 MG tablet Take 5 mg by mouth daily.    [provider]  aspirin 325 MG tablet Take 325 mg by mouth 2 (two) times daily.    [provider]  carbamazepine (TEGRETOL) 200 MG tablet Take 1 tablet (200 mg total) by mouth 3 (three) times daily. Patient taking differently: Take 200 mg by mouth 2 (two) times daily. 09/08/21 12/02/22  Penumalli, Glenford Bayley, MD  clonazePAM (KLONOPIN) 0.5 MG tablet Take 1 tablet (0.5 mg total) by mouth in the morning, at noon, and at bedtime for 1 day. 11/03/21 11/04/21  Charise Killian, MD  Coenzyme Q10 (CO Q 10) 100 MG CAPS Take 100 mg by mouth daily.    [provider]  enoxaparin (LOVENOX) 80 MG/0.8ML injection Inject 0.775 mLs (77.5 mg total) into the skin daily for 14 days. 11/04/21 11/18/21  Charise Killian, MD  fluticasone (FLONASE) 50 MCG/ACT nasal spray Place 1 spray into both nostrils daily.    [provider]  LIDOCAINE EX Apply topically. 4% ointment.1 application topically 3  times daily as needed.    [provider]  loratadine (CLARITIN) 10 MG tablet Take 10 mg by mouth daily as needed.    [provider]  Multiple Vitamin (MULTIVITAMIN) capsule Take 2 capsules by mouth daily.    [provider]  oxybutynin (DITROPAN-XL) 10 MG 24 hr tablet Take 20 mg by mouth 2 (two) times daily.    [provider]  traZODone (DESYREL) 100 MG tablet Take 100 mg by mouth at bedtime.    [provider]  venlafaxine XR (EFFEXOR-XR) 75 MG 24 hr capsule Take 150 mg by mouth daily with breakfast.    [provider]    Physical Exam: Vitals:   11/23/21 1350 11/23/21 1400 11/23/21 1430 11/23/21 1500  BP:  (!) 157/73 Marland Kitchen)  125/92 (!) 150/91  Pulse: (!) 44 (!) 39 (!) 46 88  Resp: 17 (!) 27 19 (!) 22  Temp:      TempSrc:      SpO2: 98% 97% 95% 97%  Weight:      Height:       Physical Exam Vitals and nursing note reviewed.  Constitutional:      Appearance: He is obese.     Comments: Laceration over the forehead with sutures in place  HENT:     Head: Normocephalic and atraumatic.     Nose: Nose normal.     Mouth/Throat:     Mouth: Mucous membranes are moist.  Eyes:     Pupils: Pupils are equal, round, and reactive to light.  Cardiovascular:     Rate and Rhythm: Bradycardia present.  Pulmonary:     Effort: Pulmonary effort is normal.     Breath sounds: Normal breath sounds.  Abdominal:     General: Bowel sounds are normal.     Palpations: Abdomen is soft.     Comments: Central adiposity  Musculoskeletal:        General: Normal range of motion.     Cervical back: Normal range of motion and neck supple.     Comments: Decreased range of motion right ankle.  Right ankle in a cast  Skin:    General: Skin is warm and dry.  Neurological:     General: No focal deficit present.     Mental Status: He is alert and oriented to person, place, and time.  Psychiatric:        Mood and Affect: Mood normal.        Behavior:  Behavior normal.     Data Reviewed: Relevant notes from primary care and specialist visits, past discharge summaries as available in EHR, including Care Everywhere. Prior diagnostic testing as pertinent to current admission diagnoses Updated medications and problem lists for reconciliation ED course, including vitals, labs, imaging, treatment and response to treatment Triage notes, nursing and pharmacy notes and ED provider's notes Notable results as noted in HPI Labs reviewed.  TSH 1.5, magnesium 2.0, troponin 6, BNP 14, sodium 136, potassium 4.5, chloride 101, bicarb 25, glucose 112, BUN 13, creatinine 0.92, calcium 9.5, white count 9.3, hemoglobin 15.9, hematocrit 48.3, platelet count 210 Twelve-lead EKG reviewed by me shows second-degree AV block with 2-1 AV conduction.  Right bundle branch block. There are no new results to review at this time.  Assessment and Plan: * Symptomatic bradycardia Patient presents to the ER for evaluation after he had a syncopal episode with a laceration of his forehead. EKG showed second-degree AV block with 2-1 AV conduction. Pacer pads to anterior/posterior chest wall Cardiology consulted with plans to insert a permanent pacemaker since patient was recently admitted for complete heart block and at that time was on propranolol which was discontinued.  He also had a temporary pacemaker placed at that time. Monitor patient closely  Complete heart block (HCC) Treatment as outlined in 1  HTN (hypertension) Continue amlodipine  Depression Continue venlafaxine, trazodone and Klonopin  BMI 45.0-49.9, adult (HCC) Patient has a BMI of 44.46 Complicates overall prognosis and care Lifestyle modification and exercise has been discussed in detail  Ankle fracture, right History of right ankle fracture following a prior syncopal episode Status post ORIF which was done on 10/29/21 Patient remains nonweightbearing on his right lower extremity Pain medication  as needed Follow-up with orthopedic surgery as an outpatient  Advance Care Planning:   Code Status: Full Code   Consults: Cardiology  Family Communication: Greater than 50% of time was spent discussing patient's condition and plan of care with him at the bedside.  All questions and concerns have been addressed he verbalizes understanding and agrees with the plan.  Severity of Illness: The appropriate patient status for this patient is INPATIENT. Inpatient status is judged to be reasonable and necessary in order to provide the required intensity of service to ensure the patient's safety. The patient's presenting symptoms, physical exam findings, and initial radiographic and laboratory data in the context of their chronic comorbidities is felt to place them at high risk for further clinical deterioration. Furthermore, it is not anticipated that the patient will be medically stable for discharge from the hospital within 2 midnights of admission.   * I certify that at the point of admission it is my clinical judgment that the patient will require inpatient hospital care spanning beyond 2 midnights from the point of admission due to high intensity of service, high risk for further deterioration and high frequency of surveillance required.*  Author: Lucile Shutters, MD 11/23/2021 4:14 PM  For on call review [email or web address].

## 2021-11-23 NOTE — Consult Note (Signed)
[facility] CLINIC CARDIOLOGY CONSULT NOTE       Patient ID: Jared Carey MRN: [id number] DOB/AGE: 67-Aug-1957 67 y.o.  Admit date: 11/23/2021 Referring Physician Dr. Antoine Primas Primary Physician [facility] Primary Cardiologist Dr. Juliann Pares Reason for Consultation symptomatic bradycardia  HPI: Jared Carey is a 61ZHY with a PMH of anxiety, ADD, GERD, hypertension, depression, morbid obesity, venous insufficiency who was recently admitted to [facility] from 10/24/2021 to 11/03/2021 after being found unresponsive in his home.  He fractured his right ankle and was found to to be in complete heart block requiring temporary pacemaker placement for 2 days.  Left heart cath revealed normal coronaries and EF 45-50% with global hypokinesis.  His home beta-blocker propranolol was discontinued and he underwent ORIF of his right ankle on 10/29/2021 without untoward event.  He was discharged to rehab and was scheduled for outpatient cardiology follow-up today.  He presents to [facility] ED on 6/12 after a syncopal episode while he was sitting in the shower, subsequently hitting his head requiring sutures in the ED.  EKG on admission showed second-degree AV block, heart rate in the 40s.  The patient presents with his wife who contributes to history.  He states that over the past 6 weeks he has had at least 8 syncopal episodes, 4 of which occurring within the past week.  Once he was standing at the sink and suddenly lost consciousness, earlier this week he was using an exercise machine when he slumped over, and today he was sitting in the shower and lost consciousness, hitting and cutting his forehead.  He denies any preceding dizziness, nausea or chest pain during these episodes,  happening completely out of the blue.  Sometimes he notices a fluttering in his chest but not always correlating with syncope.  He feels exhausted and somewhat short of breath when he regains consciousness.  His propranolol was discontinued after his last  admission, but unfortunately these episodes have persisted.  He is a former smoker for 7 years, quit in 1976, he has not drink alcohol since 1985.  He does have a family history of heart attacks and most of the family members on his father side of the family.  Vitals are notable for blood pressure of 150/91.  EKG on admission showed second-degree AV block with heart rate 46.  Heart rate was initially in the 40s and sinus rhythm with second-degree AV block, heart rate improving to the 80s during interview.  Labs are notable for potassium 4.5, BUN/creatinine 13/0.92, GFR greater than 60.  BNP negative at 14, high-sensitivity troponin is 6.  H&H stable at 15.9/48.3, platelets 210.  CT head and C-spine negative for acute intracranial abnormality, small forehead scalp hematoma, enlarged and heterogenous left thyroid with 2.7 cm nodule in the left thyroid lobe for which outpatient thyroid ultrasound was recommended.  Chest x-ray negative for active cardiopulmonary disease  Review of systems complete and found to be negative unless listed above     Past Medical History:  Diagnosis Date   ADD (attention deficit disorder)    Akathisia 01/18/2019   Allergy    Anemia    Anxiety    Barrett esophagus    Chest pain 08/07/2016   Colon polyps    Depression    Diverticulosis    Dysphagia    Edema    Gait abnormality 03/09/2021   GERD (gastroesophageal reflux disease)    Hypertension    Incontinence    urge   Major depressive disorder    Obesity  Obesity    Occipital neuralgia 03/09/2021   Bilateral   Osteoarthritis of thumbs, bilateral    Overactive bladder    Renal infarction (HCC)    Tardive akathisia    Tardive dyskinesia    Thoracic stomach hernia    Thyroid disease    Thyroid nodule    benign   Venous insufficiency     Past Surgical History:  Procedure Laterality Date   BIOPSY THYROID  2018   benign   COLONOSCOPY  09/2017   [facility]   ESOPHAGOGASTRODUODENOSCOPY  09/2017   [facility]    INGUINAL HERNIA REPAIR Right 1982   LAPAROSCOPIC CHOLECYSTECTOMY  2002   LEFT HEART CATH AND CORONARY ANGIOGRAPHY N/A 10/24/2021   Procedure: LEFT HEART CATH AND CORONARY ANGIOGRAPHY;  Surgeon: Marykay Lex, MD;  Location: [facility] INVASIVE CV LAB;  Service: Cardiovascular;  Laterality: N/A;   LUMBAR DISC SURGERY  1999   L5-S1 fusion   NERVE AND TENDON REPAIR Left 1998   left elbow   ORIF ANKLE FRACTURE Right 10/29/2021   Procedure: OPEN REDUCTION INTERNAL FIXATION (ORIF) ANKLE FRACTURE;  Surgeon: Kennedy Bucker, MD;  Location: [facility] ORS;  Service: Orthopedics;  Laterality: Right;   TEMPORARY PACEMAKER N/A 10/24/2021   Procedure: TEMPORARY PACEMAKER;  Surgeon: Marykay Lex, MD;  Location: [facility] INVASIVE CV LAB;  Service: Cardiovascular;  Laterality: N/A;   TONSILLECTOMY  1960    (Not in a hospital admission)  Social History   Socioeconomic History   Marital status: Married    Spouse name: Boyd Kerbs   Number of children: 3   Years of education: 12   Highest education level: Not on file  Occupational History    Comment: disabled  Tobacco Use   Smoking status: Former    Years: 7.00    Types: Cigarettes    Quit date: 1977    Years since quitting: 46.4   Smokeless tobacco: Former    Types: Chew    Quit date: 1977  Vaping Use   Vaping Use: Never used  Substance and Sexual Activity   Alcohol use: Not Currently    Comment: Quit 1985   Drug use: Not Currently    Types: Marijuana   Sexual activity: Not on file  Other Topics Concern   Not on file  Social History Narrative   Lives with [place]   Right Handed   Drinks caffeinated tea AM 2 cups   Social Determinants of Health   Financial Resource Strain: Not on file  Food Insecurity: Not on file  Transportation Needs: Not on file  Physical Activity: Not on file  Stress: Not on file  Social Connections: Not on file  Intimate Partner Violence: Not on file    Family History  Problem Relation Age of Onset   Pneumonia Mother     Mitral valve prolapse Mother    Parkinsonism Mother    Diabetes Mother    Depression Mother    Hyperthyroidism Mother    Heart attack Father    Bladder Cancer Father    Hyperthyroidism Father    Heart disease Father    Breast cancer Sister    Colon cancer Brother       PHYSICAL EXAM General: Pleasant obese Caucasian male, well nourished, in no acute distress.  Sitting upright in ED stretcher with wife at bedside HEENT:  Normocephalic.  Vertical, linear 2.5 cm laceration in the center of his forehead near his scalp with sutures placed, streaks of dried blood surrounding Neck:  No JVD.  Lungs: Normal respiratory effort on room air. Clear bilaterally to auscultation. No wheezes, crackles, rhonchi.  Chest: Defibrillator pads on anterior and posterior chest Heart: HRRR . Normal S1 and S2 without gallops or murmurs.  Abdomen: Obese appearing.  Umbilical hernia present Msk: Normal strength and tone for age. Extremities:  No clubbing, cyanosis.  Right lower extremity with short cast up to his mid calf s/p recent ORIF, left lower extremity without edema.   Neuro: Alert and oriented X 3. Psych:  Answers questions appropriately.   Labs:   Lab Results  Component Value Date   WBC 9.3 11/23/2021   HGB 15.9 11/23/2021   HCT 48.3 11/23/2021   MCV 88.3 11/23/2021   PLT 210 11/23/2021    Recent Labs  Lab 11/23/21 1314  NA 136  K 4.5  CL 101  CO2 25  BUN 13  CREATININE 0.92  CALCIUM 9.5  GLUCOSE 112*   Lab Results  Component Value Date   TROPONINI <0.03 08/07/2016   No results found for: "CHOL" No results found for: "HDL" No results found for: "LDLCALC" Lab Results  Component Value Date   TRIG 304 (H) 10/25/2021   No results found for: "CHOLHDL" No results found for: "LDLDIRECT"    Radiology: CT HEAD WO CONTRAST  Result Date: 11/23/2021 CLINICAL DATA:  Syncope/presyncope, cerebrovascular cause suspected; Neck trauma (Age >= 65y) EXAM: CT HEAD WITHOUT CONTRAST CT CERVICAL  SPINE WITHOUT CONTRAST TECHNIQUE: Multidetector CT imaging of the head and cervical spine was performed following the standard protocol without intravenous contrast. Multiplanar CT image reconstructions of the cervical spine were also generated. RADIATION DOSE REDUCTION: This exam was performed according to the departmental dose-optimization program which includes automated exposure control, adjustment of the mA and/or kV according to patient size and/or use of iterative reconstruction technique. COMPARISON:  None Available. FINDINGS: CT HEAD FINDINGS Brain: No evidence of acute intracranial hemorrhage or extra-axial collection.No evidence of mass lesion/concerning mass effect.The ventricles are normal in size.Scattered subcortical and periventricular white matter hypodensities, nonspecific but likely sequela of chronic small vessel ischemic disease. Vascular: No hyperdense vessel or unexpected calcification. Skull: Negative for skull fracture Sinuses/Orbits: No acute findings. Other: Small forehead scalp hematoma. CT CERVICAL SPINE FINDINGS Alignment: Degenerative grade 1 anterolisthesis at C3-C4 and C7-T1. Skull base and vertebrae: There is no evidence of acute cervical spine fracture. No aggressive osseous lesion. Soft tissues and spinal canal: No prevertebral fluid or swelling. No visible canal hematoma. Disc levels: There is moderate to severe multilevel degenerative disc disease from C4 inferiorly. There is multilevel facet arthropathy, severe on the left at C3-C4 and bilaterally at C7-T1. There is probable left-sided neural foraminal narrowing at C3-C4. Probable mild-to-moderate spinal canal narrowing at C4-C5. Upper chest: Negative. Other: Enlarged and heterogeneous left thyroid gland with 2.7 cm heterogeneous inferior left thyroid nodule. IMPRESSION: No acute intracranial abnormality. Mild sequela of chronic small vessel ischemic disease. Small forehead scalp hematoma. No acute cervical spine fracture.  Multilevel degenerative disc disease and facet arthropathy. Enlarged and heterogeneous left thyroid with 2.7 cm nodule in the inferior left thyroid lobe. If not previously performed, recommend non-emergent thyroid ultrasound as an outpatient for further evaluation. Electronically Signed   By: Caprice RenshawJacob  Kahn M.D.   On: 11/23/2021 14:19   CT Cervical Spine Wo Contrast  Result Date: 11/23/2021 CLINICAL DATA:  Syncope/presyncope, cerebrovascular cause suspected; Neck trauma (Age >= 65y) EXAM: CT HEAD WITHOUT CONTRAST CT CERVICAL SPINE WITHOUT CONTRAST TECHNIQUE: Multidetector CT imaging of the head and cervical spine was  performed following the standard protocol without intravenous contrast. Multiplanar CT image reconstructions of the cervical spine were also generated. RADIATION DOSE REDUCTION: This exam was performed according to the departmental dose-optimization program which includes automated exposure control, adjustment of the mA and/or kV according to patient size and/or use of iterative reconstruction technique. COMPARISON:  None Available. FINDINGS: CT HEAD FINDINGS Brain: No evidence of acute intracranial hemorrhage or extra-axial collection.No evidence of mass lesion/concerning mass effect.The ventricles are normal in size.Scattered subcortical and periventricular white matter hypodensities, nonspecific but likely sequela of chronic small vessel ischemic disease. Vascular: No hyperdense vessel or unexpected calcification. Skull: Negative for skull fracture Sinuses/Orbits: No acute findings. Other: Small forehead scalp hematoma. CT CERVICAL SPINE FINDINGS Alignment: Degenerative grade 1 anterolisthesis at C3-C4 and C7-T1. Skull base and vertebrae: There is no evidence of acute cervical spine fracture. No aggressive osseous lesion. Soft tissues and spinal canal: No prevertebral fluid or swelling. No visible canal hematoma. Disc levels: There is moderate to severe multilevel degenerative disc disease from C4  inferiorly. There is multilevel facet arthropathy, severe on the left at C3-C4 and bilaterally at C7-T1. There is probable left-sided neural foraminal narrowing at C3-C4. Probable mild-to-moderate spinal canal narrowing at C4-C5. Upper chest: Negative. Other: Enlarged and heterogeneous left thyroid gland with 2.7 cm heterogeneous inferior left thyroid nodule. IMPRESSION: No acute intracranial abnormality. Mild sequela of chronic small vessel ischemic disease. Small forehead scalp hematoma. No acute cervical spine fracture. Multilevel degenerative disc disease and facet arthropathy. Enlarged and heterogeneous left thyroid with 2.7 cm nodule in the inferior left thyroid lobe. If not previously performed, recommend non-emergent thyroid ultrasound as an outpatient for further evaluation. Electronically Signed   By: Caprice Renshaw M.D.   On: 11/23/2021 14:19   DG Chest Portable 1 View  Result Date: 11/23/2021 CLINICAL DATA:  Syncope bradycardia EXAM: PORTABLE CHEST 1 VIEW COMPARISON:  Chest radiograph 10/24/2021 FINDINGS: A defibrillator pad overlies the chest. The heart is at the upper limits of normal for size, unchanged. The upper mediastinal contours are normal. There is no focal consolidation or pulmonary edema. There is no pleural effusion or pneumothorax There is no acute osseous abnormality. IMPRESSION: No radiographic evidence of acute cardiopulmonary process. Electronically Signed   By: Lesia Hausen M.D.   On: 11/23/2021 14:17   DG Ankle 2 Views Right  Result Date: 10/29/2021 CLINICAL DATA:  ORIF right ankle. EXAM: RIGHT ANKLE - 2 VIEW COMPARISON:  None Available. FINDINGS: Intraoperative fluoroscopic images for right ankle ORIF. Plate and screw fixation of the distal fibula and a single cancellous screw through the medial malleolus. Total fluoroscopic time was 2 minutes and 55 seconds. Total fluoroscopic dose was 6.321 mGy. IMPRESSION: Intraoperative utilization of fluoroscopy. Electronically Signed   By:  Larose Hires D.O.   On: 10/29/2021 15:01   DG C-Arm 1-60 Min-No Report  Result Date: 10/29/2021 Fluoroscopy was utilized by the requesting physician.  No radiographic interpretation.   [place] OR NERVE BLOCK-IMAGE ONLY [facility])  Result Date: 10/29/2021 There is no interpretation for this exam.  This order is for images obtained during a surgical procedure.  Please See "Surgeries" Tab for more information regarding the procedure.   DG Ankle Complete Right  Result Date: 10/27/2021 CLINICAL DATA:  Bimalleolar fracture. EXAM: RIGHT ANKLE - COMPLETE 3+ VIEW COMPARISON:  Oct 24, 2021. FINDINGS: The right ankle has been casted and immobilized. There is continued presence of mildly displaced medial malleolar and distal fibular fractures. IMPRESSION: Continued presence of mildly displaced medial malleolar  and distal fibular fractures. Electronically Signed   By: Lupita Raider M.D.   On: 10/27/2021 08:35   DG Knee 1-2 Views Right  Result Date: 10/25/2021 CLINICAL DATA:  Right knee pain EXAM: RIGHT KNEE - 1-2 VIEW COMPARISON:  None Available. FINDINGS: No evidence of fracture, dislocation, or joint effusion. No evidence of significant arthropathy or other focal bone abnormality. Soft tissues are unremarkable. IMPRESSION: No acute osseous abnormality identified. Electronically Signed   By: Jannifer Hick M.D.   On: 10/25/2021 16:30   CT CHEST WO CONTRAST  Result Date: 10/24/2021 CLINICAL DATA:  Bradycardia, abnormal chest x-ray EXAM: CT CHEST WITHOUT CONTRAST TECHNIQUE: Multidetector CT imaging of the chest was performed following the standard protocol without IV contrast. RADIATION DOSE REDUCTION: This exam was performed according to the departmental dose-optimization program which includes automated exposure control, adjustment of the mA and/or kV according to patient size and/or use of iterative reconstruction technique. COMPARISON:  10/24/2021, 08/07/2016 FINDINGS: Cardiovascular: Unenhanced imaging of the  heart and great vessels demonstrates no pericardial effusion. 4 cm ascending thoracic aortic aneurysm unchanged. Evaluation of the vascular lumen is limited without IV contrast. Cardiac pacer from inferior approach, lead terminates within the right ventricle. Mediastinum/Nodes: Patient is intubated, endotracheal tube terminating well above carina. Enteric catheter extends into the gastric lumen. 3.2 cm left lobe thyroid nodules unchanged since 2018. No pathologic adenopathy within the mediastinum or hila. Lungs/Pleura: Dependent atelectasis seen bilaterally. No airspace disease, effusion, or pneumothorax. Minimal mucoid material within the mainstem bronchi. Upper Abdomen: No acute abnormality. Musculoskeletal: No acute or destructive bony lesions. Reconstructed images demonstrate no additional findings. IMPRESSION: 1. Support devices as above. 2. Dependent hypoventilatory changes bilaterally. No acute airspace disease. 3. 4 cm ascending thoracic aortic aneurysm, unchanged. Recommend annual imaging followup by CTA or MRA. This recommendation follows 2010 ACCF/AHA/AATS/ACR/ASA/SCA/SCAI/SIR/STS/SVM Guidelines for the Diagnosis and Management of Patients with Thoracic Aortic Disease. Circulation. 2010; 121: N829-F621. Aortic aneurysm NOS (ICD10-I71.9) Electronically Signed   By: Sharlet Salina M.D.   On: 10/24/2021 19:27   DG Ankle Right Port  Result Date: 10/24/2021 CLINICAL DATA:  Larey Seat.  Pain and bruising. EXAM: PORTABLE RIGHT ANKLE - 2 VIEW COMPARISON:  None Available. FINDINGS: Limited examination due to the patient's condition and limited positioning. There appears to be a transverse fracture of the medial malleolus. No obvious fibular fracture. The mid and hindfoot bony structures are intact. Diffuse soft tissue swelling is noted. IMPRESSION: 1. Limited examination due to the patient's condition and limited positioning. 2. Suspect transverse fracture of the medial malleolus. Recommend follow-up dedicated ankle  films when possible. Electronically Signed   By: Rudie Meyer M.D.   On: 10/24/2021 18:05   DG Tibia/Fibula Right Port  Result Date: 10/24/2021 CLINICAL DATA:  Fall.  Right lower extremity bruising and pain. EXAM: PORTABLE RIGHT TIBIA AND FIBULA - 2 VIEW COMPARISON:  None Available. FINDINGS: Fracture of the medial malleolus at the ankle joint. Probable fracture of the distal fibula, not well-defined. No other fractures.  No bone lesions. Knee and ankle joints are normally aligned. Diffuse soft tissue edema. IMPRESSION: 1. Ankle fractures including a fracture across the base of the medial malleolus and a suspected distal fibular fracture. These will be further assessed on the ankle radiographs. 2. No other fractures. No dislocation. Diffuse soft tissue swelling. Electronically Signed   By: Amie Portland M.D.   On: 10/24/2021 18:04   DG Chest Port 1 View  Result Date: 10/24/2021 CLINICAL DATA:  Bradycardia. EXAM: PORTABLE CHEST  1 VIEW COMPARISON:  Oct 24, 2021 (1:51 p.m.) FINDINGS: Multiple overlying cardiac lead wires are seen. There is stable endotracheal tube and nasogastric tube positioning. The heart size and mediastinal contours are within normal limits. Low lung volumes are seen with mild areas of bibasilar scarring and/or atelectasis. Interval improvement of the previously noted pulmonary vascular congestion is seen. There is no evidence of a pleural effusion or pneumothorax. The visualized skeletal structures are unremarkable. IMPRESSION: 1. Low lung volumes with mild bibasilar scarring and/or atelectasis. 2. Interval improvement of the previously noted pulmonary vascular congestion. Electronically Signed   By: Aram Candela M.D.   On: 10/24/2021 17:34   CARDIAC CATHETERIZATION  Result Date: 10/24/2021   LV end diastolic pressure is severely elevated. Successful placement of Temporary Transvenous Pacemaker via 6 French RFV 23 cm sheath -> with loop and RA, of the catheter was advanced to  78-79 cm. => Rate set at 60 bpm, 5 MA with threshold of 1 MA.  Initially set at 70 bpm, reduced to 60. (Patient actually had a intrinsic rhythm upon completion of procedure) Angiographically Normal Coronary Arteries Relatively preserved LVEF (estimated 45 to 50%, with global hypokinesis) EDP of 28-30 mmHg (40 mg IV Lasix administered) RECOMMENDATIONS Continue evaluation for nonischemic etiology for complete heart block EP consultation Continue to Manage Diastolic Heart Failure    [facility]  There are no prior TTE studies for comparison. Bradycardia (HR 40s noted).  Normal LV soze. No LVH. LVEF >55% Mild RV enlargement with normal function Mild RA enlargement No significant valve disease No pulmonary hypertension. No pericardial effusion. Mildly dilated ascending aorta.  TELEMETRY reviewed by me: Sinus rhythm with intermittent second-degree AV block rate 40s, improved to the 80s during interview  EKG reviewed by me: Second-degree AV block, heart rate 46  ASSESSMENT AND PLAN:  Jared Carey is a 74ZWC with a PMH of anxiety, ADD, GERD, hypertension, depression, morbid obesity, venous insufficiency who was recently admitted to [facility] from 10/24/2021 to 11/03/2021 after being found unresponsive in his home.  He fractured his right ankle and was found to to be in complete heart block requiring temporary pacemaker placement for 2 days.  Left heart cath revealed normal coronaries and EF 45-50% with global hypokinesis.  His home beta-blocker propranolol was discontinued and he underwent ORIF of his right ankle on 10/29/2021 without untoward event.  He was discharged to rehab and was scheduled for outpatient cardiology follow-up today.  He presents to [facility] ED on 6/12 after a syncopal episode while he was sitting in the shower, subsequently hitting his head requiring sutures in the ED.  EKG on admission showed second-degree AV block, heart rate in the 40s.  #Symptomatic bradycardia #Second-degree  AV block The patient has had 8 syncopal episodes over the past 6 weeks, 4 of which occurring over the past week -all without preceding prodrome, and recent admission last month with complete heart block requiring temporary pacemaker for 2 days, heart block resolving by the time of discharge and discontinuation of his propranolol.  EKG on admission showed second-degree heart block with rate in the 40s.  -Keep defibrillator pads in place -No indication for temporary pacemaker wire at this time, blood pressure is good at my time of evaluation at 150/91 -Hold antihypertensives (amlodipine 5mg ), do not give beta-blocker -Discussed risks and benefits of permanent pacemaker placement and options for dual-chamber versus Micra leadless pacemaker.  Patient prefers to have a .  Plan for procedure 6/13 at 9:30  AM with Dr. Marcina Millard.  We will make n.p.o. at midnight.  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 -not on GDMT at baseline, consider initiation post pacemaker placement   This patient's plan of care was discussed and created with Dr. Juliann Pares and he is in agreement.  Signed: Rebeca Allegra , PA-C 11/23/2021, 2:40 PM [facility]

## 2021-11-23 NOTE — ED Provider Notes (Signed)
[facility] Provider Note    Event Date/Time   First MD Initiated Contact with Patient 11/23/21 1335     (approximate)   History   Loss of Consciousness   HPI  Jared Carey is a 66 y.o. male past medical history of diverticulosis, anxiety, anemia, ADD, akathisia, obesity, thyroid disease and overactive bladder as well as recent admission for symptomatic complete heart block requiring placement of pacemaker subsequently this was discontinued as well as management of a right ankle fracture with patient currently in a cast who presents from rehab for evaluation after syncopal episode.  Patient states he has had 3-4 syncopal episodes over the last week or so.  He states that today he reached down to clean his foot in the shower when he passed out waking up in a pool of blood.  He has not any blood thinners calcium channel blockers or beta-blockers aside from some low-dose Norvasc.  He denies any other recent sick symptoms including any new pain in his right ankle or any recent fevers, chills, cough, nausea, vomiting, diarrhea or rash.  The last time he syncopized a couple days ago when he is on exercise bike.  He is not on any blood thinners.  He endorses a slight headache some mild dizziness but no other symptoms at this time.    Past Medical History:  Diagnosis Date   ADD (attention deficit disorder)    Akathisia 01/18/2019   Allergy    Anemia    Anxiety    Barrett esophagus    Chest pain 08/07/2016   Colon polyps    Depression    Diverticulosis    Dysphagia    Edema    Gait abnormality 03/09/2021   GERD (gastroesophageal reflux disease)    Hypertension    Incontinence    urge   Major depressive disorder    Obesity    Obesity    Occipital neuralgia 03/09/2021   Bilateral   Osteoarthritis of thumbs, bilateral    Overactive bladder    Renal infarction (HCC)    Tardive akathisia    Tardive dyskinesia    Thoracic stomach hernia    Thyroid disease     Thyroid nodule    benign   Venous insufficiency      Physical Exam  Triage Vital Signs: ED Triage Vitals  Enc Vitals Group     BP 11/23/21 1302 (!) 153/83     Pulse Rate 11/23/21 1302 (!) 47     Resp 11/23/21 1302 18     Temp 11/23/21 1302 98.4 F (36.9 C)     Temp Source 11/23/21 1302 Oral     SpO2 11/23/21 1302 96 %     Weight 11/23/21 1303 (!) 337 lb (152.9 kg)     Height 11/23/21 1303 6\' 1"  (1.854 m)     Head Circumference --      Peak Flow --      Pain Score 11/23/21 1303 5     Pain Loc --      Pain Edu? --      Excl. in GC? --     Most recent vital signs: Vitals:   11/23/21 1400 11/23/21 1430  BP: (!) 157/73 (!) 125/92  Pulse: (!) 39 (!) 46  Resp: (!) 27 19  Temp:    SpO2: 97% 95%    General: Awake, no distress.  CV:  Good peripheral perfusion.  2+ radial pulses.  Bradycardic. Resp:  Normal effort.  Clear  bilaterally Abd:  No distention.  Soft. Other:  Linear hemostatic 2.5 cm laceration over the mid forehead.  Cranial nerves II through XII are grossly intact.  No other obvious trauma to the face Head or neck.  There is no tenderness along the C/T/L-spine.  Cast is in place over the right ankle.  Patient otherwise has no evidence of gross trauma to the extremities.   ED Results / Procedures / Treatments  Labs (all labs ordered are listed, but only abnormal results are displayed) Labs Reviewed  BASIC METABOLIC PANEL - Abnormal; Notable for the following components:      Result Value   Glucose, Bld 112 (*)    All other components within normal limits  CBC  URINALYSIS, ROUTINE W REFLEX MICROSCOPIC  TSH  MAGNESIUM  BRAIN NATRIURETIC PEPTIDE  CBG MONITORING, ED  TROPONIN I (HIGH SENSITIVITY)  TROPONIN I (HIGH SENSITIVITY)     EKG  Sinus bradycardia with a ventricular rate of 48 with apparent to 1 secondary AV block with evidence of right bundle branch block and otherwise unremarkable intervals nonspecific ST changes in lead III without other clear  evidence of acute ischemia.   RADIOLOGY   Chest reviewed by myself shows no focal consoidation, effusion, edema, pneumothorax or other clear acute thoracic process. I also reviewed radiology interpretation and agree with findings described.  CT head and C-spine my review without evidence of a skull fracture, intracranial hemorrhage or acute C-spine injury.  Reviewed radiology interpretation and agree to findings of small vessel ischemic disease and thyroid nodule as well as forehead hematoma but no other acute process.  PROCEDURES:  Critical Care performed: Yes, see critical care procedure note(s)  .Marland KitchenLaceration Repair  Date/Time: 11/23/2021 2:44 PM  Performed by: Gilles Chiquito, MD Authorized by: Gilles Chiquito, MD   Consent:    Consent obtained:  Verbal   Consent given by:  Patient   Risks discussed:  Infection, need for additional repair, nerve damage, pain and poor cosmetic result   Alternatives discussed:  No treatment Universal protocol:    Procedure explained and questions answered to patient or proxy's satisfaction: yes     Patient identity confirmed:  Verbally with patient Laceration details:    Location:  Face   Face location:  Forehead   Length (cm):  2.5 Exploration:    Limited defect created (wound extended): no     Contaminated: no   Treatment:    Area cleansed with:  Saline   Amount of cleaning:  Standard   Irrigation solution:  Sterile water   Irrigation method:  Syringe   Visualized foreign bodies/material removed: no     Debridement:  None   Scar revision: no   Skin repair:    Repair method:  Sutures   Suture size:  5-0   Wound skin closure material used: vycril.   Suture technique:  Simple interrupted   Number of sutures:  4 Approximation:    Approximation:  Close Repair type:    Repair type:  Simple Post-procedure details:    Dressing:  Open (no dressing)   Procedure completion:  Tolerated well, no immediate complications .Critical  Care  Performed by: Gilles Chiquito, MD Authorized by: Gilles Chiquito, MD   Critical care provider statement:    Critical care time (minutes):  30   Critical care was necessary to treat or prevent imminent or life-threatening deterioration of the following conditions:  Cardiac failure   Critical care was time spent personally by me  on the following activities:  Development of treatment plan with patient or surrogate, discussions with consultants, evaluation of patient's response to treatment, examination of patient, ordering and review of laboratory studies, ordering and review of radiographic studies, ordering and performing treatments and interventions, pulse oximetry, re-evaluation of patient's condition and review of old charts     MEDICATIONS ORDERED IN ED: Medications  lidocaine-EPINEPHrine (XYLOCAINE W/EPI) 2 %-1:100000 (with pres) injection 20 mL (20 mLs Intradermal Given by Other 11/23/21 1406)     IMPRESSION / MDM / ASSESSMENT AND PLAN / ED COURSE  I reviewed the triage vital signs and the nursing notes. Patient's presentation is most consistent with acute presentation with potential threat to life or bodily function.                               Differential diagnosis includes, but is not limited to repeat syncopal episode from symptomatic bradycardia related to heart block.  I reviewed patient's recent admission including work-up involving cardiac cath from 5/13 that showed normal coronary arteries patient has not had any chest pain in light of very less patient for ACS will obtain a troponin.  He is complaining some mild dizziness which suspect is related to his low heart rate.  He is not hypotensive and he is otherwise awake and alert.  He does have a fairly decent laceration on the forehead which all repaired per procedure note above.  In addition we will obtain CT head and C-spine to assess for possible occult injury or intracranial hemorrhage.  He has no other evidence of  gross trauma to the extremities and denies any other acute pain at below suspicion for other significant visceral or occult trauma.  Troponin is nonelevated and overall I have low suspicion for ACS at this time.  CBC without leukocytosis or acute anemia.  BMP is unremarkable.  We will also check his thyroid as well as basic electrolytes.  Chest reviewed by myself shows no focal consoidation, effusion, edema, pneumothorax or other clear acute thoracic process. I also reviewed radiology interpretation and agree with findings described.  CT head and C-spine my review without evidence of a skull fracture, intracranial hemorrhage or acute C-spine injury.  Reviewed radiology interpretation and agree to findings of small vessel ischemic disease and thyroid nodule as well as forehead hematoma but no other acute process.  Given evidence of heart block and concern for symptomatic bradycardia likely causing patient's syncope I consulted with on-call cardiologist Dr. Juliann Paresallwood recommended hospitalist admission and the patient would likely require permanent pacemaker placement but they would follow along as consulting service.  Forehead laceration repaired per procedure note above.  Tetanus is up-to-date.  I will admit to medicine service for further evaluation and management.      FINAL CLINICAL IMPRESSION(S) / ED DIAGNOSES   Final diagnoses:  Fall, initial encounter  Laceration of forehead, initial encounter  Heart block  Syncope and collapse     Rx / DC Orders   ED Discharge Orders     None        Note:  This document was prepared using Dragon voice recognition software and may include unintentional dictation errors.   Gilles ChiquitoSmith, Lipa Knauff P, MD 11/23/21 1450

## 2021-11-23 NOTE — Assessment & Plan Note (Addendum)
Status post pacemaker on 6/13

## 2021-11-23 NOTE — ED Notes (Signed)
Pt to CT at this time.

## 2021-11-23 NOTE — Assessment & Plan Note (Addendum)
Continue venlafaxine, trazodone and Klonopin.

## 2021-11-24 ENCOUNTER — Other Ambulatory Visit: Payer: Self-pay

## 2021-11-24 ENCOUNTER — Encounter: Payer: Self-pay | Admitting: Cardiology

## 2021-11-24 ENCOUNTER — Encounter
Admission: EM | Disposition: A | Discharge status: Home Health | Payer: Self-pay | Source: Home / Self Care | Attending: Internal Medicine

## 2021-11-24 DIAGNOSIS — I10 Essential (primary) hypertension: Secondary | ICD-10-CM | POA: Diagnosis not present

## 2021-11-24 DIAGNOSIS — R001 Bradycardia, unspecified: Secondary | ICD-10-CM | POA: Diagnosis not present

## 2021-11-24 DIAGNOSIS — I441 Atrioventricular block, second degree: Secondary | ICD-10-CM

## 2021-11-24 DIAGNOSIS — Z6841 Body Mass Index (BMI) 40.0 and over, adult: Secondary | ICD-10-CM

## 2021-11-24 DIAGNOSIS — F324 Major depressive disorder, single episode, in partial remission: Secondary | ICD-10-CM

## 2021-11-24 HISTORY — PX: PACEMAKER LEADLESS INSERTION: EP1219

## 2021-11-24 LAB — BASIC METABOLIC PANEL
Anion gap: 5 (ref 5–15)
BUN: 13 mg/dL (ref 8–23)
CO2: 28 mmol/L (ref 22–32)
Calcium: 9.1 mg/dL (ref 8.9–10.3)
Chloride: 103 mmol/L (ref 98–111)
Creatinine, Ser: 0.74 mg/dL (ref 0.61–1.24)
GFR, Estimated: >60 mL/min (ref >60)
Glucose, Bld: 110 mg/dL — ABNORMAL HIGH (ref 70–99)
Potassium: 3.9 mmol/L (ref 3.5–5.1)
Sodium: 136 mmol/L (ref 135–145)

## 2021-11-24 LAB — URINALYSIS, ROUTINE W REFLEX MICROSCOPIC
Bilirubin Urine: NEGATIVE
Glucose, UA: NEGATIVE mg/dL
Hgb urine dipstick: NEGATIVE
Ketones, ur: NEGATIVE mg/dL
Leukocytes,Ua: NEGATIVE
Nitrite: NEGATIVE
Protein, ur: NEGATIVE mg/dL
Specific Gravity, Urine: 1.009 (ref 1.005–1.030)
pH: 6 (ref 5.0–8.0)

## 2021-11-24 LAB — CBC
HCT: 45 % (ref 39.0–52.0)
Hemoglobin: 14.9 g/dL (ref 13.0–17.0)
MCH: 29.2 pg (ref 26.0–34.0)
MCHC: 33.1 g/dL (ref 30.0–36.0)
MCV: 88.2 fL (ref 80.0–100.0)
Platelets: 170 10*3/uL (ref 150–400)
RBC: 5.1 MIL/uL (ref 4.22–5.81)
RDW: 12.4 % (ref 11.5–15.5)
WBC: 8.2 10*3/uL (ref 4.0–10.5)
nRBC: 0 % (ref 0.0–0.2)

## 2021-11-24 LAB — SURGICAL PCR SCREEN
MRSA, PCR: NEGATIVE
Staphylococcus aureus: NEGATIVE

## 2021-11-24 LAB — PROTIME-INR
INR: 1 (ref 0.8–1.2)
Prothrombin Time: 13.4 seconds (ref 11.4–15.2)

## 2021-11-24 SURGERY — PACEMAKER LEADLESS INSERTION
Anesthesia: Moderate Sedation

## 2021-11-24 MED ORDER — ACETAMINOPHEN 325 MG PO TABS
ORAL_TABLET | ORAL | Status: AC
  Administered 2021-11-24: 650 mg via ORAL
  Filled 2021-11-24: qty 2

## 2021-11-24 MED ORDER — HEPARIN SODIUM (PORCINE) 1000 UNIT/ML IJ SOLN
INTRAMUSCULAR | Status: AC
  Filled 2021-11-24: qty 10

## 2021-11-24 MED ORDER — SODIUM CHLORIDE 0.9% FLUSH: 3.0000 mL | INTRAVENOUS | Status: DC | PRN

## 2021-11-24 MED ORDER — HEPARIN SODIUM (PORCINE) 1000 UNIT/ML IJ SOLN
INTRAMUSCULAR | Status: DC | PRN
  Administered 2021-11-24: 5000 [IU] via INTRAVENOUS

## 2021-11-24 MED ORDER — SODIUM CHLORIDE 0.9 % IV SOLN: 250.0000 mL | INTRAVENOUS | Status: DC | PRN

## 2021-11-24 MED ORDER — MIDAZOLAM HCL 2 MG/2ML IJ SOLN
INTRAMUSCULAR | Status: DC | PRN
  Administered 2021-11-24 (×2): 1 mg via INTRAVENOUS

## 2021-11-24 MED ORDER — ACETAMINOPHEN 325 MG PO TABS: 650.0000 mg | ORAL_TABLET | ORAL | Status: DC | PRN

## 2021-11-24 MED ORDER — SODIUM CHLORIDE 0.9 % IV SOLN
250.0000 mL | INTRAVENOUS | Status: DC
  Administered 2021-11-24: 250 mL via INTRAVENOUS

## 2021-11-24 MED ORDER — MIDAZOLAM HCL 2 MG/2ML IJ SOLN
INTRAMUSCULAR | Status: AC
  Filled 2021-11-24: qty 2

## 2021-11-24 MED ORDER — FENTANYL CITRATE (PF) 100 MCG/2ML IJ SOLN
INTRAMUSCULAR | Status: AC
  Filled 2021-11-24: qty 2

## 2021-11-24 MED ORDER — CHLORHEXIDINE GLUCONATE CLOTH 2 % EX PADS
6.0000 | MEDICATED_PAD | Freq: Every day | CUTANEOUS | Status: AC
  Administered 2021-11-24: 6 via TOPICAL

## 2021-11-24 MED ORDER — LIDOCAINE HCL 1 % IJ SOLN
INTRAMUSCULAR | Status: AC
  Filled 2021-11-24: qty 20

## 2021-11-24 MED ORDER — SODIUM CHLORIDE 0.9% FLUSH
3.0000 mL | Freq: Two times a day (BID) | INTRAVENOUS | Status: DC
  Administered 2021-11-24 – 2021-11-25 (×2): 3 mL via INTRAVENOUS

## 2021-11-24 MED ORDER — IOHEXOL 300 MG/ML  SOLN
INTRAMUSCULAR | Status: DC | PRN
  Administered 2021-11-24: 10 mL

## 2021-11-24 MED ORDER — ONDANSETRON HCL 4 MG/2ML IJ SOLN: 4.0000 mg | Freq: Four times a day (QID) | INTRAMUSCULAR | Status: DC | PRN

## 2021-11-24 MED ORDER — HEPARIN (PORCINE) IN NACL 2000-0.9 UNIT/L-% IV SOLN
INTRAVENOUS | Status: DC | PRN
  Administered 2021-11-24: 1000 mL

## 2021-11-24 MED ORDER — LIDOCAINE HCL (PF) 1 % IJ SOLN
INTRAMUSCULAR | Status: DC | PRN
  Administered 2021-11-24: 20 mL

## 2021-11-24 MED ORDER — FENTANYL CITRATE (PF) 100 MCG/2ML IJ SOLN
INTRAMUSCULAR | Status: DC | PRN
Start: 2021-11-24 — End: 2021-11-24
  Administered 2021-11-24: 50 ug via INTRAVENOUS
  Administered 2021-11-24: 25 ug via INTRAVENOUS

## 2021-11-24 SURGICAL SUPPLY — 17 items
DILATOR VESSEL 38 20CM 12FR (INTRODUCER) ×1 IMPLANT
DILATOR VESSEL 38 20CM 14FR (INTRODUCER) ×1 IMPLANT
DILATOR VESSEL 38 20CM 18FR (INTRODUCER) ×1 IMPLANT
DILATOR VESSEL 38 20CM 8FR (INTRODUCER) ×1 IMPLANT
MICRA AV TRANSCATH PACING SYS (Pacemaker) ×2 IMPLANT
MICRA INTRODUCER SHEATH (SHEATH) ×2
NDL PERC 18GX7CM (NEEDLE) IMPLANT
NEEDLE PERC 18GX7CM (NEEDLE) ×2 IMPLANT
PACK CARDIAC CATH (CUSTOM PROCEDURE TRAY) ×2 IMPLANT
PAD ELECT DEFIB RADIOL ZOLL (MISCELLANEOUS) ×1 IMPLANT
PROTECTION STATION PRESSURIZED (MISCELLANEOUS) ×2
SHEATH AVANTI 7FRX11 (SHEATH) ×1 IMPLANT
SHEATH INTRODUCER MICRA (SHEATH) IMPLANT
STATION PROTECTION PRESSURIZED (MISCELLANEOUS) IMPLANT
SUT SILK 0 FSL (SUTURE) ×1 IMPLANT
SYSTEM PACING TRNSCTH AV MICRA (Pacemaker) IMPLANT
WIRE AMPLATZ SS-J .035X180CM (WIRE) ×1 IMPLANT

## 2021-11-24 NOTE — Progress Notes (Signed)
  Progress Note   Patient: Jared Carey WYO:[id number] DOB: February 17, 1956 DOA: 11/23/2021     1 DOS: the patient was seen and examined on 11/24/2021   Brief hospital course: 66 y.o. male with medical history significant for depression, anxiety, morbid obesity (BMI 44.46), recent admission for symptomatic bradycardia for complete heart block s/p temporary pacemaker insertion and cardiac cath which showed normal coronaries in 05/23, LVEF 45 - 50% with global hypokinesis, history of acute hypoxic and hypercapnic respiratory failure s/p mechanical intubation, history of right ankle fracture following a syncopal episode status post ORIF of his right ankle on 10/29/21 readmitted now for syncopal episode and was found to have second-degree AV block with heart rate in 40s  6/13: Status post pacemaker   Assessment and Plan: * Second degree AV block Status post pacemaker on 6/13  Symptomatic bradycardia Patient presents to the ER for evaluation after he had a syncopal episode with a laceration of his forehead. EKG showed second-degree AV block with 2-1 AV conduction. Status post permanent pacemaker placement today. Cardiology will evaluate right groin site tomorrow and remove the sutures.  Potential discharge tomorrow if he is doing well  HTN (hypertension) Continue amlodipine.  Depression Continue venlafaxine, trazodone and Klonopin.  BMI 45.0-49.9, adult [facility]) Patient has a BMI of 44.46 Complicates overall prognosis and care Lifestyle modification and exercise has been discussed in detail.  Ankle fracture, right History of right ankle fracture following a prior syncopal episode Status post ORIF which was done on 10/29/21 Patient remains nonweightbearing on his right lower extremity Pain medication as needed Follow-up with orthopedic surgery as an outpatient.        Subjective: No new complaints.  Wife at bedside.  Patient is feeling much better.  Has some questions around pacemaker and  how his future will hold   Physical Exam: Vitals:   11/24/21 1200 11/24/21 1215 11/24/21 1230 11/24/21 1241  BP: 119/74 130/79 131/70   Pulse: 71 73 76 73  Resp: 17 19 18 14   Temp:      TempSrc:      SpO2: 93% 93% 92% 94%  Weight:      Height:       66 year old obese male lying in the bed comfortably without any acute distress Lungs clear to auscultation bilaterally, no wheezing or rales or rhonchi Cardiovascular regular rate and rhythm Abdomen soft, obese, umbilical hernia + Neuro alert and oriented, nonfocal Psych normal mood and affect Data Reviewed:  There are no new results to review at this time.  Family Communication: Wife updated at bedside  Disposition: Status is: Inpatient Remains inpatient appropriate because: Status post pacemaker earlier today will need telemetry monitoring overnight and removal of sutures tomorrow in the groin   Planned Discharge Destination: Home    DVT prophylaxis-SCDs Time spent: 35 minutes  Author: 70, MD 11/24/2021 3:26 PM  For on call review [email or web address].

## 2021-11-24 NOTE — Hospital Course (Signed)
66 y.o. male with medical history significant for depression, anxiety, morbid obesity (BMI 44.46), recent admission for symptomatic bradycardia for complete heart block s/p temporary pacemaker insertion and cardiac cath which showed normal coronaries in 05/23, LVEF 45 - 50% with global hypokinesis, history of acute hypoxic and hypercapnic respiratory failure s/p mechanical intubation, history of right ankle fracture following a syncopal episode status post ORIF of his right ankle on 10/29/21 readmitted now for syncopal episode and was found to have second-degree AV block with heart rate in 40s  6/13: Status post pacemaker

## 2021-11-24 NOTE — Progress Notes (Signed)
[facility] CARDIOLOGY CONSULT NOTE       Patient ID: [name] MRN: [id number] DOB/AGE: 01/28/1956 66 y.o.  Admit date: 11/23/2021 Referring Physician Dr. Antoine Primas Primary Physician [facility] Primary Cardiologist Dr. Juliann Pares Reason for Consultation symptomatic bradycardia  HPI: Jared "Rosanne Carey" Huseby is a 12KGM with a PMH of anxiety, ADD, GERD, hypertension, depression, morbid obesity, venous insufficiency who was recently admitted to [facility] from 10/24/2021 to 11/03/2021 after being found unresponsive in his home.  He fractured his right ankle and was found to to be in complete heart block requiring temporary pacemaker placement for 2 days.  Left heart cath revealed normal coronaries and EF 45-50% with global hypokinesis.  His home beta-blocker propranolol was discontinued and he underwent ORIF of his right ankle on 10/29/2021 without untoward event.  He was discharged to rehab and was scheduled for outpatient cardiology follow-up today.  He presents to [facility] ED on 6/12 after a syncopal episode while he was sitting in the shower, subsequently hitting his head requiring sutures in the ED.  EKG on admission showed second-degree AV block, heart rate in the 40s. Successful Micra leadless pacemaker placement on 6/13.   Interval History:  - stable overnight, HR in the 70s in sinus on tele - successful micra pacemaker placement this morning without complication - seen this afternoon, had a few palpitations where he could feel his heart beating that were short lived. No chest pain, SOB.   Review of systems complete and found to be negative unless listed above     Past Medical History:  Diagnosis Date   ADD (attention deficit disorder)    Akathisia 01/18/2019   Allergy    Anemia    Anxiety    Barrett esophagus    Chest pain 08/07/2016   Colon polyps    Depression    Diverticulosis    Dysphagia    Edema    Gait abnormality 03/09/2021   GERD (gastroesophageal reflux disease)    Hypertension     Incontinence    urge   Major depressive disorder    Obesity    Obesity    Occipital neuralgia 03/09/2021   Bilateral   Osteoarthritis of thumbs, bilateral    Overactive bladder    Renal infarction (HCC)    Tardive akathisia    Tardive dyskinesia    Thoracic stomach hernia    Thyroid disease    Thyroid nodule    benign   Venous insufficiency     Past Surgical History:  Procedure Laterality Date   BIOPSY THYROID  2018   benign   COLONOSCOPY  09/2017   [facility]   ESOPHAGOGASTRODUODENOSCOPY  09/2017   [facility]   INGUINAL HERNIA REPAIR Right 1982   LAPAROSCOPIC CHOLECYSTECTOMY  2002   LEFT HEART CATH AND CORONARY ANGIOGRAPHY N/A 10/24/2021   Procedure: LEFT HEART CATH AND CORONARY ANGIOGRAPHY;  Surgeon: Marykay Lex, MD;  Location: [facility] INVASIVE CV LAB;  Service: Cardiovascular;  Laterality: N/A;   LUMBAR DISC SURGERY  1999   L5-S1 fusion   NERVE AND TENDON REPAIR Left 1998   left elbow   ORIF ANKLE FRACTURE Right 10/29/2021   Procedure: OPEN REDUCTION INTERNAL FIXATION (ORIF) ANKLE FRACTURE;  Surgeon: Kennedy Bucker, MD;  Location: [facility] ORS;  Service: Orthopedics;  Laterality: Right;   TEMPORARY PACEMAKER N/A 10/24/2021   Procedure: TEMPORARY PACEMAKER;  Surgeon: Marykay Lex, MD;  Location: [facility] INVASIVE CV LAB;  Service: Cardiovascular;  Laterality: N/A;   TONSILLECTOMY  1960  Medications Prior to Admission  Medication Sig Dispense Refill Last Dose   ACETAMINOPHEN EXTRA STRENGTH 500 MG tablet Take 1,000 mg by mouth 2 (two) times daily as needed.   11/22/2021 at 2041   amLODipine (NORVASC) 5 MG tablet Take 10 mg by mouth daily.   11/23/2021 at 0943   aspirin 325 MG tablet Take 325 mg by mouth 2 (two) times daily.   11/23/2021 at 0943   carbamazepine (TEGRETOL) 200 MG tablet Take 1 tablet (200 mg total) by mouth 3 (three) times daily. 270 tablet 4 11/23/2021 at 0943   clonazePAM (KLONOPIN) 0.5 MG tablet Take 1 tablet (0.5 mg total) by mouth in the morning, at noon, and at bedtime  for 1 day. (Patient taking differently: Take 0.5 mg by mouth 2 (two) times daily.) 3 tablet 0 11/23/2021 at 0943   Coenzyme Q10 (CO Q 10) 100 MG CAPS Take 100 mg by mouth daily.   11/23/2021 at 0943   fluticasone (FLONASE) 50 MCG/ACT nasal spray Place 1 spray into both nostrils daily.   11/23/2021 at 0943   oxybutynin (DITROPAN-XL) 10 MG 24 hr tablet Take 20 mg by mouth 2 (two) times daily.   11/23/2021 at 0943   traZODone (DESYREL) 100 MG tablet Take 100 mg by mouth at bedtime.   11/22/2021 at 2101   venlafaxine XR (EFFEXOR-XR) 75 MG 24 hr capsule Take 150 mg by mouth daily with breakfast.   11/23/2021 at 0943   albuterol (PROVENTIL HFA;VENTOLIN HFA) 108 (90 BASE) MCG/ACT inhaler Inhale 2 puffs into the lungs every 6 (six) hours as needed for wheezing or shortness of breath.   prn   enoxaparin (LOVENOX) 80 MG/0.8ML injection Inject 0.775 mLs (77.5 mg total) into the skin daily for 14 days. 10.85 mL 0    HYDROcodone-acetaminophen (NORCO/VICODIN) 5-325 MG tablet Take 1 tablet by mouth every 8 (eight) hours as needed.   11/19/2021 at 1028   LIDOCAINE EX Apply topically. 4% ointment.1 application topically 3 times daily as needed.   prn   loratadine (CLARITIN) 10 MG tablet Take 10 mg by mouth daily as needed.   prn   Multiple Vitamin (MULTIVITAMIN) capsule Take 2 capsules by mouth daily. (Patient not taking: Reported on 11/23/2021)   Not Taking    Social History   Socioeconomic History   Marital status: Married    Spouse name: Boyd Kerbs   Number of children: 3   Years of education: 12   Highest education level: Not on file  Occupational History    Comment: disabled  Tobacco Use   Smoking status: Former    Years: 7.00    Types: Cigarettes    Quit date: 1977    Years since quitting: 46.4   Smokeless tobacco: Former    Types: Chew    Quit date: 1977  Vaping Use   Vaping Use: Never used  Substance and Sexual Activity   Alcohol use: Not Currently    Comment: Quit 1985   Drug use: Not Currently     Types: Marijuana   Sexual activity: Not on file  Other Topics Concern   Not on file  Social History Narrative   Lives with Alverda   Right Handed   Drinks caffeinated tea AM 2 cups   Social Determinants of Health   Financial Resource Strain: Not on file  Food Insecurity: Not on file  Transportation Needs: Not on file  Physical Activity: Not on file  Stress: Not on file  Social Connections: Not on file  Intimate Partner Violence: Not on file    Family History  Problem Relation Age of Onset   Pneumonia Mother    Mitral valve prolapse Mother    Parkinsonism Mother    Diabetes Mother    Depression Mother    Hyperthyroidism Mother    Heart attack Father    Bladder Cancer Father    Hyperthyroidism Father    Heart disease Father    Breast cancer Sister    Colon cancer Brother       PHYSICAL EXAM General: Pleasant obese Caucasian male, well nourished, in no acute distress. Laying flat in PCU bed with wife at bedside.  HEENT:  Normocephalic.  Vertical, linear 2.5 cm laceration in the center of his forehead near his scalp with dissolvable sutures placed Neck:  No JVD.  Lungs: Normal respiratory effort on room air. Clear bilaterally to auscultation. No wheezes, crackles, rhonchi.  Heart: HRRR . Normal S1 and S2 without gallops or murmurs.  Abdomen: Obese appearing.  Umbilical hernia present Msk: Normal strength and tone for age. Extremities:  No clubbing, cyanosis.  Right lower extremity with short cast up to his mid calf s/p recent ORIF, left lower extremity without edema.   Neuro: Alert and oriented X 3. Psych:  Answers questions appropriately.   Labs:   Lab Results  Component Value Date   WBC 8.2 11/24/2021   HGB 14.9 11/24/2021   HCT 45.0 11/24/2021   MCV 88.2 11/24/2021   PLT 170 11/24/2021    Recent Labs  Lab 11/24/21 0607  NA 136  K 3.9  CL 103  CO2 28  BUN 13  CREATININE 0.74  CALCIUM 9.1  GLUCOSE 110*    Lab Results  Component Value Date    TROPONINI <0.03 08/07/2016   No results found for: "CHOL" No results found for: "HDL" No results found for: "LDLCALC" Lab Results  Component Value Date   TRIG 304 (H) 10/25/2021   No results found for: "CHOLHDL" No results found for: "LDLDIRECT"    Radiology: EP PPM/ICD IMPLANT  Result Date: 11/24/2021 Successful [employer] Micra AV leadless pacemaker implantation   CT HEAD WO CONTRAST  Result Date: 11/23/2021 CLINICAL DATA:  Syncope/presyncope, cerebrovascular cause suspected; Neck trauma (Age >= 65y) EXAM: CT HEAD WITHOUT CONTRAST CT CERVICAL SPINE WITHOUT CONTRAST TECHNIQUE: Multidetector CT imaging of the head and cervical spine was performed following the standard protocol without intravenous contrast. Multiplanar CT image reconstructions of the cervical spine were also generated. RADIATION DOSE REDUCTION: This exam was performed according to the departmental dose-optimization program which includes automated exposure control, adjustment of the mA and/or kV according to patient size and/or use of iterative reconstruction technique. COMPARISON:  None Available. FINDINGS: CT HEAD FINDINGS Brain: No evidence of acute intracranial hemorrhage or extra-axial collection.No evidence of mass lesion/concerning mass effect.The ventricles are normal in size.Scattered subcortical and periventricular white matter hypodensities, nonspecific but likely sequela of chronic small vessel ischemic disease. Vascular: No hyperdense vessel or unexpected calcification. Skull: Negative for skull fracture Sinuses/Orbits: No acute findings. Other: Small forehead scalp hematoma. CT CERVICAL SPINE FINDINGS Alignment: Degenerative grade 1 anterolisthesis at C3-C4 and C7-T1. Skull base and vertebrae: There is no evidence of acute cervical spine fracture. No aggressive osseous lesion. Soft tissues and spinal canal: No prevertebral fluid or swelling. No visible canal hematoma. Disc levels: There is moderate to severe multilevel  degenerative disc disease from C4 inferiorly. There is multilevel facet arthropathy, severe on the left at C3-C4 and bilaterally at C7-T1. There is  probable left-sided neural foraminal narrowing at C3-C4. Probable mild-to-moderate spinal canal narrowing at C4-C5. Upper chest: Negative. Other: Enlarged and heterogeneous left thyroid gland with 2.7 cm heterogeneous inferior left thyroid nodule. IMPRESSION: No acute intracranial abnormality. Mild sequela of chronic small vessel ischemic disease. Small forehead scalp hematoma. No acute cervical spine fracture. Multilevel degenerative disc disease and facet arthropathy. Enlarged and heterogeneous left thyroid with 2.7 cm nodule in the inferior left thyroid lobe. If not previously performed, recommend non-emergent thyroid ultrasound as an outpatient for further evaluation. Electronically Signed   By: Caprice RenshawJacob  Kahn M.D.   On: 11/23/2021 14:19   CT Cervical Spine Wo Contrast  Result Date: 11/23/2021 CLINICAL DATA:  Syncope/presyncope, cerebrovascular cause suspected; Neck trauma (Age >= 65y) EXAM: CT HEAD WITHOUT CONTRAST CT CERVICAL SPINE WITHOUT CONTRAST TECHNIQUE: Multidetector CT imaging of the head and cervical spine was performed following the standard protocol without intravenous contrast. Multiplanar CT image reconstructions of the cervical spine were also generated. RADIATION DOSE REDUCTION: This exam was performed according to the departmental dose-optimization program which includes automated exposure control, adjustment of the mA and/or kV according to patient size and/or use of iterative reconstruction technique. COMPARISON:  None Available. FINDINGS: CT HEAD FINDINGS Brain: No evidence of acute intracranial hemorrhage or extra-axial collection.No evidence of mass lesion/concerning mass effect.The ventricles are normal in size.Scattered subcortical and periventricular white matter hypodensities, nonspecific but likely sequela of chronic small vessel ischemic  disease. Vascular: No hyperdense vessel or unexpected calcification. Skull: Negative for skull fracture Sinuses/Orbits: No acute findings. Other: Small forehead scalp hematoma. CT CERVICAL SPINE FINDINGS Alignment: Degenerative grade 1 anterolisthesis at C3-C4 and C7-T1. Skull base and vertebrae: There is no evidence of acute cervical spine fracture. No aggressive osseous lesion. Soft tissues and spinal canal: No prevertebral fluid or swelling. No visible canal hematoma. Disc levels: There is moderate to severe multilevel degenerative disc disease from C4 inferiorly. There is multilevel facet arthropathy, severe on the left at C3-C4 and bilaterally at C7-T1. There is probable left-sided neural foraminal narrowing at C3-C4. Probable mild-to-moderate spinal canal narrowing at C4-C5. Upper chest: Negative. Other: Enlarged and heterogeneous left thyroid gland with 2.7 cm heterogeneous inferior left thyroid nodule. IMPRESSION: No acute intracranial abnormality. Mild sequela of chronic small vessel ischemic disease. Small forehead scalp hematoma. No acute cervical spine fracture. Multilevel degenerative disc disease and facet arthropathy. Enlarged and heterogeneous left thyroid with 2.7 cm nodule in the inferior left thyroid lobe. If not previously performed, recommend non-emergent thyroid ultrasound as an outpatient for further evaluation. Electronically Signed   By: Caprice RenshawJacob  Kahn M.D.   On: 11/23/2021 14:19   DG Chest Portable 1 View  Result Date: 11/23/2021 CLINICAL DATA:  Syncope bradycardia EXAM: PORTABLE CHEST 1 VIEW COMPARISON:  Chest radiograph 10/24/2021 FINDINGS: A defibrillator pad overlies the chest. The heart is at the upper limits of normal for size, unchanged. The upper mediastinal contours are normal. There is no focal consolidation or pulmonary edema. There is no pleural effusion or pneumothorax There is no acute osseous abnormality. IMPRESSION: No radiographic evidence of acute cardiopulmonary process.  Electronically Signed   By: Lesia HausenPeter  Noone M.D.   On: 11/23/2021 14:17   DG Ankle 2 Views Right  Result Date: 10/29/2021 CLINICAL DATA:  ORIF right ankle. EXAM: RIGHT ANKLE - 2 VIEW COMPARISON:  None Available. FINDINGS: Intraoperative fluoroscopic images for right ankle ORIF. Plate and screw fixation of the distal fibula and a single cancellous screw through the medial malleolus. Total fluoroscopic time was 2 minutes  and 55 seconds. Total fluoroscopic dose was 6.321 mGy. IMPRESSION: Intraoperative utilization of fluoroscopy. Electronically Signed   By: Larose Hires D.O.   On: 10/29/2021 15:01   DG C-Arm 1-60 Min-No Report  Result Date: 10/29/2021 Fluoroscopy was utilized by the requesting physician.  No radiographic interpretation.   [place] OR NERVE BLOCK-IMAGE ONLY [facility])  Result Date: 10/29/2021 There is no interpretation for this exam.  This order is for images obtained during a surgical procedure.  Please See "Surgeries" Tab for more information regarding the procedure.   DG Ankle Complete Right  Result Date: 10/27/2021 CLINICAL DATA:  Bimalleolar fracture. EXAM: RIGHT ANKLE - COMPLETE 3+ VIEW COMPARISON:  Oct 24, 2021. FINDINGS: The right ankle has been casted and immobilized. There is continued presence of mildly displaced medial malleolar and distal fibular fractures. IMPRESSION: Continued presence of mildly displaced medial malleolar and distal fibular fractures. Electronically Signed   By: Lupita Raider M.D.   On: 10/27/2021 08:35   DG Knee 1-2 Views Right  Result Date: 10/25/2021 CLINICAL DATA:  Right knee pain EXAM: RIGHT KNEE - 1-2 VIEW COMPARISON:  None Available. FINDINGS: No evidence of fracture, dislocation, or joint effusion. No evidence of significant arthropathy or other focal bone abnormality. Soft tissues are unremarkable. IMPRESSION: No acute osseous abnormality identified. Electronically Signed   By: Jannifer Hick M.D.   On: 10/25/2021 16:30    [facility]   There are no prior TTE studies for comparison. Bradycardia (HR 40s noted).  Normal LV soze. No LVH. LVEF >55% Mild RV enlargement with normal function Mild RA enlargement No significant valve disease No pulmonary hypertension. No pericardial effusion. Mildly dilated ascending aorta.  TELEMETRY reviewed by me: Sinus rhythm with intermittent second-degree AV block rate 40s, improved to the 80s during interview  EKG reviewed by me: Second-degree AV block, heart rate 46  ASSESSMENT AND PLAN:  Jared Carey is a 82TDV with a PMH of anxiety, ADD, GERD, hypertension, depression, morbid obesity, venous insufficiency who was recently admitted to [facility] from 10/24/2021 to 11/03/2021 after being found unresponsive in his home.  He fractured his right ankle and was found to to be in complete heart block requiring temporary pacemaker placement for 2 days.  Left heart cath revealed normal coronaries and EF 45-50% with global hypokinesis.  His home beta-blocker propranolol was discontinued and he underwent ORIF of his right ankle on 10/29/2021 without untoward event.  He was discharged to rehab and was scheduled for outpatient cardiology follow-up today.  He presents to [facility] ED on 6/12 after a syncopal episode while he was sitting in the shower, subsequently hitting his head requiring sutures in the ED.  EKG on admission showed second-degree AV block, heart rate in the 40s.  #Symptomatic bradycardia #Second-degree AV block  #s/p Medtronic Micra leadless pacemaker placement 6/13 The patient has had 8 syncopal episodes over the past 6 weeks, 4 of which occurring over the past week -all without preceding prodrome, and recent admission last month with complete heart block requiring temporary pacemaker for 2 days, heart block resolving by the time of discharge and discontinuation of his propranolol.  EKG on admission showed second-degree heart block with rate in the 40s.  -stable post pacemaker placement this  morning -Ok to resume amlodipine 5mg  if BP remains elevated -monitor on telemetry  -Will assess femoral access site on R groin tomorrow morning and remove suture - if stable and feeling well, likely ok for discharge tomorrow.   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 -not on GDMT at baseline, consider repeat echo and further management on an outpatient basis   #snoring Reports snoring at night, waking up gasping for air (wife confirms). Occasional morning headaches. - discussed following up with his PCP for consideration of sleep study on an outpatient basis.   This patient's plan of care was discussed and created with Dr. Juliann Pares and he is in agreement.  Signed: Rebeca Allegra , PA-C 11/24/2021, 1:30 PM [facility] Cardiology

## 2021-11-24 NOTE — Discharge Instructions (Signed)
Please avoid showering until Thursday 6/15. Do not submerge yourself in water for the next week. If your bandage gets wet or starts to fall off, replace it with another piece of gauze and the Tegaderm (clear bandage I provided). You should try to keep it covered for a week. You will follow up with Dr. Juliann Pares in the office in about 1 week. If you have bleeding from the site, lie down and apply firm pressure for 20 minutes, if it continues to bleed, call our office at [facility] [phone]). Avoid heavy lifting, squatting (other than sitting) and strenuous activity for 1 week. You will get a call from [employer] to set up your pacemaker and the [employer] device. You do not need to bring this device with you to appointments. It is used to monitor your pacemaker from home.

## 2021-11-25 ENCOUNTER — Encounter: Payer: Self-pay | Admitting: Internal Medicine

## 2021-11-25 DIAGNOSIS — I441 Atrioventricular block, second degree: Secondary | ICD-10-CM | POA: Diagnosis not present

## 2021-11-25 LAB — BASIC METABOLIC PANEL
Anion gap: 7 (ref 5–15)
BUN: 14 mg/dL (ref 8–23)
CO2: 27 mmol/L (ref 22–32)
Calcium: 9 mg/dL (ref 8.9–10.3)
Chloride: 101 mmol/L (ref 98–111)
Creatinine, Ser: 0.76 mg/dL (ref 0.61–1.24)
GFR, Estimated: >60 mL/min (ref >60)
Glucose, Bld: 107 mg/dL — ABNORMAL HIGH (ref 70–99)
Potassium: 3.7 mmol/L (ref 3.5–5.1)
Sodium: 135 mmol/L (ref 135–145)

## 2021-11-25 LAB — GLUCOSE, CAPILLARY: Glucose-Capillary: 97 mg/dL (ref 70–99)

## 2021-11-25 MED ORDER — BUTALBITAL-APAP-CAFFEINE 50-325-40 MG PO TABS
1.0000 | ORAL_TABLET | Freq: Four times a day (QID) | ORAL | Status: DC | PRN
Start: 2021-11-25 — End: 2021-11-25
  Administered 2021-11-25: 1 via ORAL
  Filled 2021-11-25: qty 1

## 2021-11-25 NOTE — Discharge Summary (Signed)
Physician Discharge Summary   Patient: Jared Carey MRN: [id number] DOB: Apr 27, 1956  Admit date:     11/23/2021  Discharge date: 11/25/21  Discharge Physician: Pennie Banter   PCP: Nada Boozer, MD   Recommendations at discharge:   Follow-up with cardiology in 1 week Follow-up with orthopedic surgery as scheduled Follow-up with primary care in 1 week Repeat BMP, Mg, CBC in 1 week  Discharge Diagnoses: Principal Problem:   Second degree AV block Active Problems:   HTN (hypertension)   Depression   BMI 45.0-49.9, adult (HCC)   Ankle fracture, right  Resolved Problems:   Symptomatic bradycardia  Hospital Course: 66 y.o. male with medical history significant for depression, anxiety, morbid obesity (BMI 44.46), recent admission for symptomatic bradycardia for complete heart block s/p temporary pacemaker insertion and cardiac cath which showed normal coronaries in 05/23, LVEF 45 - 50% with global hypokinesis, history of acute hypoxic and hypercapnic respiratory failure s/p mechanical intubation, history of right ankle fracture following a syncopal episode status post ORIF of his right ankle on 10/29/21 readmitted now for syncopal episode and was found to have second-degree AV block with heart rate in 40s  6/13: Status post pacemaker  Assessment and Plan: * Second degree AV block Status post pacemaker on 6/13  Symptomatic bradycardia-resolved as of 11/25/2021 Patient presents to the ER for evaluation after he had a syncopal episode with a laceration of his forehead. EKG showed second-degree AV block with 2-1 AV conduction. Status post permanent pacemaker placement today. Cardiology will evaluate right groin site tomorrow and remove the sutures.  Potential discharge tomorrow if he is doing well  HTN (hypertension) Continue amlodipine.  Depression Continue venlafaxine, trazodone and Klonopin.  BMI 45.0-49.9, adult [facility]) Patient has a BMI of 44.46 Complicates overall  prognosis and care Lifestyle modification and exercise has been discussed in detail.  Ankle fracture, right History of right ankle fracture following a prior syncopal episode.  Status post ORIF which was done on 10/29/21 with Dr. Rosita Kea.  Had been at SNF, requests to return home as he has made as much progress at rehab as he can until he is able to bear weight on the right lower extremity. --Patient reports instructed for toe-touch weightbearing on his right lower extremity. --Pain medication as needed --Follow-up with orthopedic surgery as an outpatient. -- PT and OT recommending home health -- DME ordered: Wheelchair and bariatric bedside commode         Consultants: Cardiology Procedures performed: Pacemaker placement 6/13  Disposition: Home health  Diet recommendation:  Cardiac diet   DISCHARGE MEDICATION: Allergies as of 11/25/2021       Reactions   Aripiprazole Other (See Comments)   Naproxen Other (See Comments)   Flexeril [cyclobenzaprine]    Gabapentin    Increased acathisia   Benadryl [diphenhydramine Hcl (sleep)]    Omeprazole Diarrhea   Robaxin [methocarbamol] Rash   Simvastatin         Medication List     STOP taking these medications    aspirin 325 MG tablet   enoxaparin 80 MG/0.8ML injection Commonly known as: LOVENOX       TAKE these medications    Acetaminophen Extra Strength 500 MG tablet Generic drug: acetaminophen Take 1,000 mg by mouth 2 (two) times daily as needed.   albuterol 108 (90 Base) MCG/ACT inhaler Commonly known as: VENTOLIN HFA Inhale 2 puffs into the lungs every 6 (six) hours as needed for wheezing or shortness of breath.   amLODipine 5  MG tablet Commonly known as: NORVASC Take 10 mg by mouth daily.   carbamazepine 200 MG tablet Commonly known as: TEGRETOL Take 1 tablet (200 mg total) by mouth 3 (three) times daily.   clonazePAM 0.5 MG tablet Commonly known as: KLONOPIN Take 1 tablet (0.5 mg total) by mouth in  the morning, at noon, and at bedtime for 1 day. What changed: when to take this   Co Q 10 100 MG Caps Take 100 mg by mouth daily.   fluticasone 50 MCG/ACT nasal spray Commonly known as: FLONASE Place 1 spray into both nostrils daily.   HYDROcodone-acetaminophen 5-325 MG tablet Commonly known as: NORCO/VICODIN Take 1 tablet by mouth every 8 (eight) hours as needed.   LIDOCAINE EX Apply topically. 4% ointment.1 application topically 3 times daily as needed.   loratadine 10 MG tablet Commonly known as: CLARITIN Take 10 mg by mouth daily as needed.   multivitamin capsule Take 2 capsules by mouth daily.   oxybutynin 10 MG 24 hr tablet Commonly known as: DITROPAN-XL Take 20 mg by mouth 2 (two) times daily.   traZODone 100 MG tablet Commonly known as: DESYREL Take 100 mg by mouth at bedtime.   venlafaxine XR 75 MG 24 hr capsule Commonly known as: EFFEXOR-XR Take 150 mg by mouth daily with breakfast.               Durable Medical Equipment  (From admission, onward)           Start     Ordered   11/25/21 1409  For home use only DME 3 n 1  Once       Comments: Needs bariatric bedside commode   11/25/21 1408   11/25/21 0953  For home use only DME standard manual wheelchair with seat cushion  Once       Comments: Patient suffers from right ankle fracture which impairs their ability to perform daily activities like bathing, dressing, grooming in the home.  A cane, crutch, or walker will not resolve issue with performing activities of daily living. A wheelchair will allow patient to safely perform daily activities. Patient can safely propel the wheelchair in the home or has a caregiver who can provide assistance. Length of need 6 months . Accessories: elevating leg rests (ELRs), wheel locks, extensions and anti-tippers.   11/25/21 0953              Discharge Care Instructions  (From admission, onward)           Start     Ordered   11/25/21 0000  Discharge  wound care:       Comments: Cleanse area gently with soap and water daily, pat dry.   11/25/21 1517            Follow-up Information     Callwood, Dwayne D, MD. Go in 1 week(s).   Specialties: Cardiology, Internal Medicine Contact information: [address] [place] [address] [phone]                Discharge Exam: Ceasar Mons Weights   11/23/21 1303 11/24/21 0125 11/25/21 0527  Weight: (!) 152.9 kg (!) 149.4 kg (!) 148.4 kg   General exam: awake, alert, no acute distress HEENT: atraumatic, clear conjunctiva, anicteric sclera, moist mucus membranes, hearing grossly normal  Respiratory system: CTAB, no wheezes, rales or rhonchi, normal respiratory effort. Cardiovascular system: normal S1/S2, RRR Gastrointestinal system: soft, NT, ND, no HSM felt, +bowel sounds. Central nervous system: A&O x3. no  gross focal neurologic deficits, normal speech Extremities: Right distal lower extremity and rigid cast, no edema, normal tone Skin: dry, intact, normal temperature Psychiatry: normal mood, congruent affect, judgement and insight appear normal   Condition at discharge: stable  The results of significant diagnostics from this hospitalization (including imaging, microbiology, ancillary and laboratory) are listed below for reference.   Imaging Studies: EP PPM/ICD IMPLANT  Result Date: 11/24/2021 Successful [employer] Micra AV leadless pacemaker implantation   CT HEAD WO CONTRAST  Result Date: 11/23/2021 CLINICAL DATA:  Syncope/presyncope, cerebrovascular cause suspected; Neck trauma (Age >= 65y) EXAM: CT HEAD WITHOUT CONTRAST CT CERVICAL SPINE WITHOUT CONTRAST TECHNIQUE: Multidetector CT imaging of the head and cervical spine was performed following the standard protocol without intravenous contrast. Multiplanar CT image reconstructions of the cervical spine were also generated. RADIATION DOSE REDUCTION: This exam was performed according to the departmental  dose-optimization program which includes automated exposure control, adjustment of the mA and/or kV according to patient size and/or use of iterative reconstruction technique. COMPARISON:  None Available. FINDINGS: CT HEAD FINDINGS Brain: No evidence of acute intracranial hemorrhage or extra-axial collection.No evidence of mass lesion/concerning mass effect.The ventricles are normal in size.Scattered subcortical and periventricular white matter hypodensities, nonspecific but likely sequela of chronic small vessel ischemic disease. Vascular: No hyperdense vessel or unexpected calcification. Skull: Negative for skull fracture Sinuses/Orbits: No acute findings. Other: Small forehead scalp hematoma. CT CERVICAL SPINE FINDINGS Alignment: Degenerative grade 1 anterolisthesis at C3-C4 and C7-T1. Skull base and vertebrae: There is no evidence of acute cervical spine fracture. No aggressive osseous lesion. Soft tissues and spinal canal: No prevertebral fluid or swelling. No visible canal hematoma. Disc levels: There is moderate to severe multilevel degenerative disc disease from C4 inferiorly. There is multilevel facet arthropathy, severe on the left at C3-C4 and bilaterally at C7-T1. There is probable left-sided neural foraminal narrowing at C3-C4. Probable mild-to-moderate spinal canal narrowing at C4-C5. Upper chest: Negative. Other: Enlarged and heterogeneous left thyroid gland with 2.7 cm heterogeneous inferior left thyroid nodule. IMPRESSION: No acute intracranial abnormality. Mild sequela of chronic small vessel ischemic disease. Small forehead scalp hematoma. No acute cervical spine fracture. Multilevel degenerative disc disease and facet arthropathy. Enlarged and heterogeneous left thyroid with 2.7 cm nodule in the inferior left thyroid lobe. If not previously performed, recommend non-emergent thyroid ultrasound as an outpatient for further evaluation. Electronically Signed   By: Caprice Renshaw M.D.   On: 11/23/2021  14:19   CT Cervical Spine Wo Contrast  Result Date: 11/23/2021 CLINICAL DATA:  Syncope/presyncope, cerebrovascular cause suspected; Neck trauma (Age >= 65y) EXAM: CT HEAD WITHOUT CONTRAST CT CERVICAL SPINE WITHOUT CONTRAST TECHNIQUE: Multidetector CT imaging of the head and cervical spine was performed following the standard protocol without intravenous contrast. Multiplanar CT image reconstructions of the cervical spine were also generated. RADIATION DOSE REDUCTION: This exam was performed according to the departmental dose-optimization program which includes automated exposure control, adjustment of the mA and/or kV according to patient size and/or use of iterative reconstruction technique. COMPARISON:  None Available. FINDINGS: CT HEAD FINDINGS Brain: No evidence of acute intracranial hemorrhage or extra-axial collection.No evidence of mass lesion/concerning mass effect.The ventricles are normal in size.Scattered subcortical and periventricular white matter hypodensities, nonspecific but likely sequela of chronic small vessel ischemic disease. Vascular: No hyperdense vessel or unexpected calcification. Skull: Negative for skull fracture Sinuses/Orbits: No acute findings. Other: Small forehead scalp hematoma. CT CERVICAL SPINE FINDINGS Alignment: Degenerative grade 1 anterolisthesis at C3-C4 and C7-T1. Skull base and vertebrae:  There is no evidence of acute cervical spine fracture. No aggressive osseous lesion. Soft tissues and spinal canal: No prevertebral fluid or swelling. No visible canal hematoma. Disc levels: There is moderate to severe multilevel degenerative disc disease from C4 inferiorly. There is multilevel facet arthropathy, severe on the left at C3-C4 and bilaterally at C7-T1. There is probable left-sided neural foraminal narrowing at C3-C4. Probable mild-to-moderate spinal canal narrowing at C4-C5. Upper chest: Negative. Other: Enlarged and heterogeneous left thyroid gland with 2.7 cm  heterogeneous inferior left thyroid nodule. IMPRESSION: No acute intracranial abnormality. Mild sequela of chronic small vessel ischemic disease. Small forehead scalp hematoma. No acute cervical spine fracture. Multilevel degenerative disc disease and facet arthropathy. Enlarged and heterogeneous left thyroid with 2.7 cm nodule in the inferior left thyroid lobe. If not previously performed, recommend non-emergent thyroid ultrasound as an outpatient for further evaluation. Electronically Signed   By: Caprice Renshaw M.D.   On: 11/23/2021 14:19   DG Chest Portable 1 View  Result Date: 11/23/2021 CLINICAL DATA:  Syncope bradycardia EXAM: PORTABLE CHEST 1 VIEW COMPARISON:  Chest radiograph 10/24/2021 FINDINGS: A defibrillator pad overlies the chest. The heart is at the upper limits of normal for size, unchanged. The upper mediastinal contours are normal. There is no focal consolidation or pulmonary edema. There is no pleural effusion or pneumothorax There is no acute osseous abnormality. IMPRESSION: No radiographic evidence of acute cardiopulmonary process. Electronically Signed   By: Lesia Hausen M.D.   On: 11/23/2021 14:17   DG Ankle 2 Views Right  Result Date: 10/29/2021 CLINICAL DATA:  ORIF right ankle. EXAM: RIGHT ANKLE - 2 VIEW COMPARISON:  None Available. FINDINGS: Intraoperative fluoroscopic images for right ankle ORIF. Plate and screw fixation of the distal fibula and a single cancellous screw through the medial malleolus. Total fluoroscopic time was 2 minutes and 55 seconds. Total fluoroscopic dose was 6.321 mGy. IMPRESSION: Intraoperative utilization of fluoroscopy. Electronically Signed   By: Larose Hires D.O.   On: 10/29/2021 15:01   DG C-Arm 1-60 Min-No Report  Result Date: 10/29/2021 Fluoroscopy was utilized by the requesting physician.  No radiographic interpretation.   [place] OR NERVE BLOCK-IMAGE ONLY [facility])  Result Date: 10/29/2021 There is no interpretation for this exam.  This order is for  images obtained during a surgical procedure.  Please See "Surgeries" Tab for more information regarding the procedure.   DG Ankle Complete Right  Result Date: 10/27/2021 CLINICAL DATA:  Bimalleolar fracture. EXAM: RIGHT ANKLE - COMPLETE 3+ VIEW COMPARISON:  Oct 24, 2021. FINDINGS: The right ankle has been casted and immobilized. There is continued presence of mildly displaced medial malleolar and distal fibular fractures. IMPRESSION: Continued presence of mildly displaced medial malleolar and distal fibular fractures. Electronically Signed   By: Lupita Raider M.D.   On: 10/27/2021 08:35    Microbiology: Results for orders placed or performed during the hospital encounter of 11/23/21  Surgical PCR screen     Status: None   Collection Time: 11/24/21  4:34 AM   Specimen: Urine, Clean Catch; Nasal Swab  Result Value Ref Range Status   MRSA, PCR NEGATIVE NEGATIVE Final   Staphylococcus aureus NEGATIVE NEGATIVE Final    Comment: (NOTE) The Xpert SA Assay (FDA approved for NASAL specimens in patients 16 years of age and older), is one component of a comprehensive surveillance program. It is not intended to diagnose infection nor to guide or monitor treatment. Performed at [facility], [address] [place], [place] [address]  Labs: CBC: Recent Labs  Lab 11/23/21 1314 11/24/21 0607  WBC 9.3 8.2  HGB 15.9 14.9  HCT 48.3 45.0  MCV 88.3 88.2  PLT 210 170   Basic Metabolic Panel: Recent Labs  Lab 11/23/21 1314 11/23/21 1503 11/24/21 0607 11/25/21 0602  NA 136  --  136 135  K 4.5  --  3.9 3.7  CL 101  --  103 101  CO2 25  --  28 27  GLUCOSE 112*  --  110* 107*  BUN 13  --  13 14  CREATININE 0.92  --  0.74 0.76  CALCIUM 9.5  --  9.1 9.0  MG  --  2.0  --   --    Liver Function Tests: No results for input(s): "AST", "ALT", "ALKPHOS", "BILITOT", "PROT", "ALBUMIN" in the last 168 hours. CBG: Recent Labs  Lab 11/25/21 0522  GLUCAP 97    Discharge time  spent: greater than 30 minutes.  Signed: Pennie BanterKelly A Francia Verry, DO [facility] 11/25/2021

## 2021-11-25 NOTE — Progress Notes (Signed)
[facility] CLINIC CARDIOLOGY CONSULT NOTE       Patient ID: KINSLEY NICKLAUS MRN: [id number] DOB/AGE: Sep 30, 1955 66 y.o.  Admit date: 11/23/2021 Referring Physician Dr. Antoine Primas Primary Physician [facility] Primary Cardiologist Dr. Juliann Pares Reason for Consultation symptomatic bradycardia  HPI: Beth "Rosanne Ashing" Higinbotham is a [id number] with a PMH of anxiety, ADD, GERD, hypertension, depression, morbid obesity, venous insufficiency who was recently admitted to [facility] from 10/24/2021 to 11/03/2021 after being found unresponsive in his home.  He fractured his right ankle and was found to to be in complete heart block requiring temporary pacemaker placement for 2 days.  Left heart cath revealed normal coronaries and EF 45-50% with global hypokinesis.  His home beta-blocker propranolol was discontinued and he underwent ORIF of his right ankle on 10/29/2021 without untoward event.  He was discharged to rehab and was scheduled for outpatient cardiology follow-up today.  He presents to [facility] ED on 6/12 after a syncopal episode while he was sitting in the shower, subsequently hitting his head requiring sutures in the ED.  EKG on admission showed second-degree AV block, heart rate in the 40s. Successful Micra leadless pacemaker placement on 6/13.   Interval History:  -No acute events -Had transient dizziness with sitting up from a laying position that resolved quickly.  No chest pain.  -Denies bleeding or tenderness from right femoral access site  Review of systems complete and found to be negative unless listed above     Past Medical History:  Diagnosis Date   ADD (attention deficit disorder)    Akathisia 01/18/2019   Allergy    Anemia    Anxiety    Barrett esophagus    Chest pain 08/07/2016   Colon polyps    Depression    Diverticulosis    Dysphagia    Edema    Gait abnormality 03/09/2021   GERD (gastroesophageal reflux disease)    Hypertension    Incontinence    urge   Major depressive disorder    Obesity     Obesity    Occipital neuralgia 03/09/2021   Bilateral   Osteoarthritis of thumbs, bilateral    Overactive bladder    Renal infarction (HCC)    Tardive akathisia    Tardive dyskinesia    Thoracic stomach hernia    Thyroid disease    Thyroid nodule    benign   Venous insufficiency     Past Surgical History:  Procedure Laterality Date   BIOPSY THYROID  2018   benign   COLONOSCOPY  09/2017   [facility]   ESOPHAGOGASTRODUODENOSCOPY  09/2017   [facility]   INGUINAL HERNIA REPAIR Right 1982   LAPAROSCOPIC CHOLECYSTECTOMY  2002   LEFT HEART CATH AND CORONARY ANGIOGRAPHY N/A 10/24/2021   Procedure: LEFT HEART CATH AND CORONARY ANGIOGRAPHY;  Surgeon: Marykay Lex, MD;  Location: [facility] INVASIVE CV LAB;  Service: Cardiovascular;  Laterality: N/A;   LUMBAR DISC SURGERY  1999   L5-S1 fusion   NERVE AND TENDON REPAIR Left 1998   left elbow   ORIF ANKLE FRACTURE Right 10/29/2021   Procedure: OPEN REDUCTION INTERNAL FIXATION (ORIF) ANKLE FRACTURE;  Surgeon: Kennedy Bucker, MD;  Location: [facility] ORS;  Service: Orthopedics;  Laterality: Right;   PACEMAKER LEADLESS INSERTION N/A 11/24/2021   Procedure: PACEMAKER LEADLESS INSERTION;  Surgeon: Marcina Millard, MD;  Location: [facility] INVASIVE CV LAB;  Service: Cardiovascular;  Laterality: N/A;   TEMPORARY PACEMAKER N/A 10/24/2021   Procedure: TEMPORARY PACEMAKER;  Surgeon: Marykay Lex, MD;  Location[facility] INVASIVE  CV LAB;  Service: Cardiovascular;  Laterality: N/A;   TONSILLECTOMY  1960    Medications Prior to Admission  Medication Sig Dispense Refill Last Dose   ACETAMINOPHEN EXTRA STRENGTH 500 MG tablet Take 1,000 mg by mouth 2 (two) times daily as needed.   11/22/2021 at 2041   amLODipine (NORVASC) 5 MG tablet Take 10 mg by mouth daily.   11/23/2021 at 0943   aspirin 325 MG tablet Take 325 mg by mouth 2 (two) times daily.   11/23/2021 at 0943   carbamazepine (TEGRETOL) 200 MG tablet Take 1 tablet (200 mg total) by mouth 3 (three) times daily. 270 tablet 4  11/23/2021 at 0943   clonazePAM (KLONOPIN) 0.5 MG tablet Take 1 tablet (0.5 mg total) by mouth in the morning, at noon, and at bedtime for 1 day. (Patient taking differently: Take 0.5 mg by mouth 2 (two) times daily.) 3 tablet 0 11/23/2021 at 0943   Coenzyme Q10 (CO Q 10) 100 MG CAPS Take 100 mg by mouth daily.   11/23/2021 at 0943   fluticasone (FLONASE) 50 MCG/ACT nasal spray Place 1 spray into both nostrils daily.   11/23/2021 at 0943   oxybutynin (DITROPAN-XL) 10 MG 24 hr tablet Take 20 mg by mouth 2 (two) times daily.   11/23/2021 at 0943   traZODone (DESYREL) 100 MG tablet Take 100 mg by mouth at bedtime.   11/22/2021 at 2101   venlafaxine XR (EFFEXOR-XR) 75 MG 24 hr capsule Take 150 mg by mouth daily with breakfast.   11/23/2021 at 0943   albuterol (PROVENTIL HFA;VENTOLIN HFA) 108 (90 BASE) MCG/ACT inhaler Inhale 2 puffs into the lungs every 6 (six) hours as needed for wheezing or shortness of breath.   prn   enoxaparin (LOVENOX) 80 MG/0.8ML injection Inject 0.775 mLs (77.5 mg total) into the skin daily for 14 days. 10.85 mL 0    HYDROcodone-acetaminophen (NORCO/VICODIN) 5-325 MG tablet Take 1 tablet by mouth every 8 (eight) hours as needed.   11/19/2021 at 1028   LIDOCAINE EX Apply topically. 4% ointment.1 application topically 3 times daily as needed.   prn   loratadine (CLARITIN) 10 MG tablet Take 10 mg by mouth daily as needed.   prn   Multiple Vitamin (MULTIVITAMIN) capsule Take 2 capsules by mouth daily. (Patient not taking: Reported on 11/23/2021)   Not Taking    Social History   Socioeconomic History   Marital status: Married    Spouse name: Boyd Kerbs   Number of children: 3   Years of education: 12   Highest education level: Not on file  Occupational History    Comment: disabled  Tobacco Use   Smoking status: Former    Years: 7.00    Types: Cigarettes    Quit date: 1977    Years since quitting: 46.4   Smokeless tobacco: Former    Types: Chew    Quit date: 1977  Vaping Use    Vaping Use: Never used  Substance and Sexual Activity   Alcohol use: Not Currently    Comment: Quit 1985   Drug use: Not Currently    Types: Marijuana   Sexual activity: Not on file  Other Topics Concern   Not on file  Social History Narrative   Lives with [place]   Right Handed   Drinks caffeinated tea AM 2 cups   Social Determinants of Health   Financial Resource Strain: Not on file  Food Insecurity: Not on file  Transportation Needs: Not on file  Physical  Activity: Not on file  Stress: Not on file  Social Connections: Not on file  Intimate Partner Violence: Not on file    Family History  Problem Relation Age of Onset   Pneumonia Mother    Mitral valve prolapse Mother    Parkinsonism Mother    Diabetes Mother    Depression Mother    Hyperthyroidism Mother    Heart attack Father    Bladder Cancer Father    Hyperthyroidism Father    Heart disease Father    Breast cancer Sister    Colon cancer Brother       PHYSICAL EXAM General: Pleasant obese Caucasian male, well nourished, in no acute distress.  Sitting upright in PCU bed HEENT:  Normocephalic.  Vertical, linear 2.5 cm laceration in the center of his forehead near his scalp with dissolvable sutures placed Neck:  No JVD.  Lungs: Normal respiratory effort on room air. Clear bilaterally to auscultation. No wheezes, crackles, rhonchi.  Heart: HRRR . Normal S1 and S2 without gallops or murmurs.  Abdomen: Obese appearing.  Umbilical hernia present Msk: Normal strength and tone for age. Extremities:  No clubbing, cyanosis.  Right lower extremity with short cast up to his mid calf s/p recent ORIF, left lower extremity without edema.   Right groin: No bleeding, significant tenderness to palpation, or apparent aneurysm.  Incision closed without drainage or obvious infection.  Figure-of-eight suture removed by me and covered with gauze and Tegaderm. Neuro: Alert and oriented X 3. Psych:  Answers questions appropriately.    Labs:   Lab Results  Component Value Date   WBC 8.2 11/24/2021   HGB 14.9 11/24/2021   HCT 45.0 11/24/2021   MCV 88.2 11/24/2021   PLT 170 11/24/2021    Recent Labs  Lab 11/25/21 0602  NA 135  K 3.7  CL 101  CO2 27  BUN 14  CREATININE 0.76  CALCIUM 9.0  GLUCOSE 107*    Lab Results  Component Value Date   TROPONINI <0.03 08/07/2016   No results found for: "CHOL" No results found for: "HDL" No results found for: "LDLCALC" Lab Results  Component Value Date   TRIG 304 (H) 10/25/2021   No results found for: "CHOLHDL" No results found for: "LDLDIRECT"    Radiology: EP PPM/ICD IMPLANT  Result Date: 11/24/2021 Successful Medtronic Micra AV leadless pacemaker implantation   CT HEAD WO CONTRAST  Result Date: 11/23/2021 CLINICAL DATA:  Syncope/presyncope, cerebrovascular cause suspected; Neck trauma (Age >= 65y) EXAM: CT HEAD WITHOUT CONTRAST CT CERVICAL SPINE WITHOUT CONTRAST TECHNIQUE: Multidetector CT imaging of the head and cervical spine was performed following the standard protocol without intravenous contrast. Multiplanar CT image reconstructions of the cervical spine were also generated. RADIATION DOSE REDUCTION: This exam was performed according to the departmental dose-optimization program which includes automated exposure control, adjustment of the mA and/or kV according to patient size and/or use of iterative reconstruction technique. COMPARISON:  None Available. FINDINGS: CT HEAD FINDINGS Brain: No evidence of acute intracranial hemorrhage or extra-axial collection.No evidence of mass lesion/concerning mass effect.The ventricles are normal in size.Scattered subcortical and periventricular white matter hypodensities, nonspecific but likely sequela of chronic small vessel ischemic disease. Vascular: No hyperdense vessel or unexpected calcification. Skull: Negative for skull fracture Sinuses/Orbits: No acute findings. Other: Small forehead scalp hematoma. CT CERVICAL  SPINE FINDINGS Alignment: Degenerative grade 1 anterolisthesis at C3-C4 and C7-T1. Skull base and vertebrae: There is no evidence of acute cervical spine fracture. No aggressive osseous lesion. Soft tissues  and spinal canal: No prevertebral fluid or swelling. No visible canal hematoma. Disc levels: There is moderate to severe multilevel degenerative disc disease from C4 inferiorly. There is multilevel facet arthropathy, severe on the left at C3-C4 and bilaterally at C7-T1. There is probable left-sided neural foraminal narrowing at C3-C4. Probable mild-to-moderate spinal canal narrowing at C4-C5. Upper chest: Negative. Other: Enlarged and heterogeneous left thyroid gland with 2.7 cm heterogeneous inferior left thyroid nodule. IMPRESSION: No acute intracranial abnormality. Mild sequela of chronic small vessel ischemic disease. Small forehead scalp hematoma. No acute cervical spine fracture. Multilevel degenerative disc disease and facet arthropathy. Enlarged and heterogeneous left thyroid with 2.7 cm nodule in the inferior left thyroid lobe. If not previously performed, recommend non-emergent thyroid ultrasound as an outpatient for further evaluation. Electronically Signed   By: Caprice Renshaw M.D.   On: 11/23/2021 14:19   CT Cervical Spine Wo Contrast  Result Date: 11/23/2021 CLINICAL DATA:  Syncope/presyncope, cerebrovascular cause suspected; Neck trauma (Age >= 65y) EXAM: CT HEAD WITHOUT CONTRAST CT CERVICAL SPINE WITHOUT CONTRAST TECHNIQUE: Multidetector CT imaging of the head and cervical spine was performed following the standard protocol without intravenous contrast. Multiplanar CT image reconstructions of the cervical spine were also generated. RADIATION DOSE REDUCTION: This exam was performed according to the departmental dose-optimization program which includes automated exposure control, adjustment of the mA and/or kV according to patient size and/or use of iterative reconstruction technique. COMPARISON:   None Available. FINDINGS: CT HEAD FINDINGS Brain: No evidence of acute intracranial hemorrhage or extra-axial collection.No evidence of mass lesion/concerning mass effect.The ventricles are normal in size.Scattered subcortical and periventricular white matter hypodensities, nonspecific but likely sequela of chronic small vessel ischemic disease. Vascular: No hyperdense vessel or unexpected calcification. Skull: Negative for skull fracture Sinuses/Orbits: No acute findings. Other: Small forehead scalp hematoma. CT CERVICAL SPINE FINDINGS Alignment: Degenerative grade 1 anterolisthesis at C3-C4 and C7-T1. Skull base and vertebrae: There is no evidence of acute cervical spine fracture. No aggressive osseous lesion. Soft tissues and spinal canal: No prevertebral fluid or swelling. No visible canal hematoma. Disc levels: There is moderate to severe multilevel degenerative disc disease from C4 inferiorly. There is multilevel facet arthropathy, severe on the left at C3-C4 and bilaterally at C7-T1. There is probable left-sided neural foraminal narrowing at C3-C4. Probable mild-to-moderate spinal canal narrowing at C4-C5. Upper chest: Negative. Other: Enlarged and heterogeneous left thyroid gland with 2.7 cm heterogeneous inferior left thyroid nodule. IMPRESSION: No acute intracranial abnormality. Mild sequela of chronic small vessel ischemic disease. Small forehead scalp hematoma. No acute cervical spine fracture. Multilevel degenerative disc disease and facet arthropathy. Enlarged and heterogeneous left thyroid with 2.7 cm nodule in the inferior left thyroid lobe. If not previously performed, recommend non-emergent thyroid ultrasound as an outpatient for further evaluation. Electronically Signed   By: Caprice Renshaw M.D.   On: 11/23/2021 14:19   DG Chest Portable 1 View  Result Date: 11/23/2021 CLINICAL DATA:  Syncope bradycardia EXAM: PORTABLE CHEST 1 VIEW COMPARISON:  Chest radiograph 10/24/2021 FINDINGS: A  defibrillator pad overlies the chest. The heart is at the upper limits of normal for size, unchanged. The upper mediastinal contours are normal. There is no focal consolidation or pulmonary edema. There is no pleural effusion or pneumothorax There is no acute osseous abnormality. IMPRESSION: No radiographic evidence of acute cardiopulmonary process. Electronically Signed   By: Lesia Hausen M.D.   On: 11/23/2021 14:17   DG Ankle 2 Views Right  Result Date: 10/29/2021 CLINICAL DATA:  ORIF  right ankle. EXAM: RIGHT ANKLE - 2 VIEW COMPARISON:  None Available. FINDINGS: Intraoperative fluoroscopic images for right ankle ORIF. Plate and screw fixation of the distal fibula and a single cancellous screw through the medial malleolus. Total fluoroscopic time was 2 minutes and 55 seconds. Total fluoroscopic dose was 6.321 mGy. IMPRESSION: Intraoperative utilization of fluoroscopy. Electronically Signed   By: Larose HiresImran  Ahmed D.O.   On: 10/29/2021 15:01   DG C-Arm 1-60 Min-No Report  Result Date: 10/29/2021 Fluoroscopy was utilized by the requesting physician.  No radiographic interpretation.   US OR NERVE BLOCK-IMAGE ONLY [facility])  Result Date: 10/29/2021 There is no interpretation for this exam.  This order is for images obtained during a surgical procedure.  Please See "Surgeries" Tab for more information regarding the procedure.   DG Ankle Complete Right  Result Date: 10/27/2021 CLINICAL DATA:  Bimalleolar fracture. EXAM: RIGHT ANKLE - COMPLETE 3+ VIEW COMPARISON:  Oct 24, 2021. FINDINGS: The right ankle has been casted and immobilized. There is continued presence of mildly displaced medial malleolar and distal fibular fractures. IMPRESSION: Continued presence of mildly displaced medial malleolar and distal fibular fractures. Electronically Signed   By: Lupita RaiderJames  Green Jr M.D.   On: 10/27/2021 08:35    [facility]  There are no prior TTE studies for comparison. Bradycardia (HR 40s noted).  Normal LV soze.  No LVH. LVEF >55% Mild RV enlargement with normal function Mild RA enlargement No significant valve disease No pulmonary hypertension. No pericardial effusion. Mildly dilated ascending aorta.  TELEMETRY reviewed by me: Sinus rhythm with intermittent second-degree AV block rate 40s, improved to the 80s during interview  EKG reviewed by me: Second-degree AV block, heart rate 46  ASSESSMENT AND PLAN:  Marry GuanJames "Jim" Georgetta HaberHatch is a [id number] with a PMH of anxiety, ADD, GERD, hypertension, depression, morbid obesity, venous insufficiency who [facility] from 10[facility]21 to 11/03/2021 after being found unresponsive in his home.  He fractured his right ankle and was found to to be in complete heart block requiring temporary pacemaker placement for 2 days.  Left heart cath revealed normal coronaries and EF 45-50% with global hypokinesis.  His home beta-blocker propranolol was discontinued and he underwent ORIF of his right ankle on 10/29/2021 without untoward event.  He was discharged to rehab and was scheduled for outpatient cardiology follow-up today.  [facility] ED on [facility]/12 after a syncopal episode while he was sitting in the shower, subsequently hitting his head requiring sutures in the ED.  EKG on admission showed second-degree AV block, heart rate in the 40s. Successful Micra leadless pacemaker placement on 6/13.   #Symptomatic bradycardia #Second-degree AV block  #s/p Medtronic Micra leadless pacemaker placement 6/13 The patient has had 8 syncopal episodes over the past 6 weeks, 4 of which occurring over the past week -all without preceding prodrome, and recent admission last month with complete heart block requiring temporary pacemaker for 2 days, heart block resolving by the time of discharge and discontinuation of his propranolol.  EKG on admission showed second-degree heart block with rate in the 40s.  -Remains stable post pacemaker placement  -Ok to resume amlodipine 5mg  if BP  remains elevated -monitor on telemetry  -Figure-of-eight suture removed from right groin by me, care instructions and return precautions given to patient. -Okay for discharge from a cardiac standpoint today, he will need follow-up with Dr. Juliann Paresallwood in 1 week.  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 -not on GDMT at baseline, further management on an outpatient basis   #snoring Reports snoring at night, waking up gasping for air (wife confirms). Occasional morning headaches. - discussed following up with his PCP for consideration of sleep study on an outpatient basis.   This patient's plan of care was discussed and created with Dr. Juliann Pares and he is in agreement.  Signed: Rebeca Allegra , PA-C 11/25/2021, 8:29 AM [facility] Cardiology

## 2021-11-25 NOTE — Assessment & Plan Note (Signed)
Status post pacemaker on 6/13 

## 2021-11-25 NOTE — TOC Progression Note (Addendum)
Transition of Care [facility]) - Progression Note    Patient Details  Name: Jared Carey MRN: [id number] Date of Birth: 1956/01/28  Transition of Care [facility]) CM/SW Contact  Truddie Hidden, RN Phone Number: 11/25/2021, 2:38 PM  Clinical Narrative:    Per therapy recommendation Patient would benefit from [facility] PT/ OT. Referral sent to [facility], [facility], [facility], and [facility]. Referral accepted by [facility] representative [facility]. Advised Berkley Harvey would be required by [facility] and start of care would likely be three days.   Admitted from:Home PCP:Dr.Wendy Henderson Pharmacy:[facility] Current home health/prior home health/DME:Requested bariatric BCS and WC via [employer] Transportation home: EMS transportation  3:24 EMS arrange, Patient requesting walker. Advised spouse would have to transport DME. TOC signing off          Expected Discharge Plan and Services           Expected Discharge Date: 11/27/21                                     Social Determinants of Health (SDOH) Interventions    Readmission Risk Interventions     No data to display

## 2021-11-25 NOTE — Assessment & Plan Note (Signed)
Patient has a BMI of 44.46 Complicates overall prognosis and care Lifestyle modification and exercise has been discussed in detail.

## 2021-11-25 NOTE — Assessment & Plan Note (Signed)
History of right ankle fracture following a prior syncopal episode.  Status post ORIF which was done on 10/29/21 with Dr. Rosita Kea.  Had been at SNF, requests to return home as he has made as much progress at rehab as he can until he is able to bear weight on the right lower extremity. --Patient reports instructed for toe-touch weightbearing on his right lower extremity. --Pain medication as needed --Follow-up with orthopedic surgery as an outpatient. -- PT and OT recommending home health -- DME ordered: Wheelchair and bariatric bedside commode

## 2021-11-25 NOTE — TOC Initial Note (Signed)
Transition of Care [facility]) - Initial/Assessment Note    Patient Details  Name: Jared Carey MRN: [id number] Date of Birth: [birth year]  Transition of Care [facility]) CM/SW Contact:    Truddie Hidden, RN Phone Number: 11/25/2021, 10:11 AM  Clinical Narrative:                  Transition of Care [facility]) Screening Note   Patient Details  Name: Jared Carey Date of Birth: 08-16-1955   Transition of Care [facility]) CM/SW Contact:    Truddie Hidden, RN Phone Number: 11/25/2021, 10:11 AM    Transition of Care Department [facility]) has reviewed patient and no TOC needs have been identified at this time. We will continue to monitor patient advancement through interdisciplinary progression rounds. If new patient transition needs arise, please place a TOC consult.          Patient Goals and CMS Choice        Expected Discharge Plan and Services           Expected Discharge Date: 11/27/21                                    Prior Living Arrangements/Services                       Activities of Daily Living Home Assistive Devices/Equipment: Wheelchair ADL Screening (condition at time of admission) Patient's cognitive ability adequate to safely complete daily activities?: Yes Is the patient deaf or have difficulty hearing?: No Does the patient have difficulty seeing, even when wearing glasses/contacts?: No Does the patient have difficulty concentrating, remembering, or making decisions?: Yes Patient able to express need for assistance with ADLs?: Yes Does the patient have difficulty dressing or bathing?: No Independently performs ADLs?: Yes (appropriate for developmental age) Does the patient have difficulty walking or climbing stairs?: Yes Weakness of Legs: None Weakness of Arms/Hands: None  Permission Sought/Granted                  Emotional Assessment              Admission diagnosis:  Syncope and collapse [R55] Heart block [I45.9] Symptomatic  bradycardia [R00.1] Fall, initial encounter [W19.XXXA] Laceration of forehead, initial encounter [S01.81XA] Patient Active Problem List   Diagnosis Date Noted   Ankle fracture, right 11/23/2021   Second degree AV block 10/24/2021   Symptomatic bradycardia 10/24/2021   AKI (acute kidney injury) (HCC)    Cardiogenic shock (HCC)    Gait abnormality 03/09/2021   Occipital neuralgia 03/09/2021   Akathisia 01/18/2019   Major depressive disorder with single episode, in partial remission (HCC) 12/12/2018   BMI 45.0-49.9, adult (HCC) 12/12/2018   Barrett's esophagus determined by endoscopy 02/28/2018   Multiple thyroid nodules 02/28/2018   OAB (overactive bladder) 02/28/2018   Tardive dyskinesia 02/28/2018   History of colon polyps 02/28/2018   Attention deficit disorder (ADD) 02/28/2018   Environmental and seasonal allergies 02/28/2018   Osteoarthritis of both thumbs 02/28/2018   Not currently working due to disabled status 02/28/2018   Degeneration, intervertebral disc, lumbar 02/28/2018   Constipation due to slow transit 02/28/2018   Hyperlipidemia 02/28/2018   GERD with esophagitis 08/07/2016   HTN (hypertension) 08/07/2016   Depression 08/07/2016   PCP:  Nada Boozer, MD Pharmacy:   [id number] - [place], [place] - [address]  [place] [address] Phone: [phone] Fax: [phone]     Social Determinants of Health (SDOH) Interventions    Readmission Risk Interventions     No data to display

## 2021-11-25 NOTE — Assessment & Plan Note (Signed)
-   Continue amlodipine ?

## 2021-11-25 NOTE — Plan of Care (Signed)
  Problem: Clinical Measurements: Goal: Ability to maintain clinical measurements within normal limits will improve Outcome: Progressing   Problem: Clinical Measurements: Goal: Will remain free from infection Outcome: Progressing   

## 2021-11-25 NOTE — Evaluation (Signed)
Occupational Therapy Evaluation Patient Details Name: Jared Carey MRN: [id number] DOB: 20-Mar-1956 Today's Date: 11/25/2021   History of Present Illness Jared Carey is a 78GXQ who comes to [facility] on 11/23/21 from [facility] after a syncopal episode in shower. Pt reports multiple syncopes this week. Pt was here 4 weeks ago s/p fall diagnosed with ankle fracture s/p ORIF of his right ankle on 10/29/2021 subsequent TDWB. PMH: divertociulosis, GAD, anemia, ADD, akathisia, obesity, thyroid disease, OAB, complete heart block s/p PPM.   Clinical Impression   Patient presenting with decreased Ind in self care, balance, functional mobility/transfers, endurance, and safety awareness. Patient is familiar to this therapist from last hospital admission. Pt is very pleasant, positive, and cooperative throughout session. Pt has recently been to short term rehab and feels that he can manage at home with DME and wife's assistance. Patient currently functioning at min A for functional pivot transfer with bariatric RW and several hops while maintaining precautions. OT discussed use of bariatric BSC at home for toileting needs as well as energy conservation to sit on [facility] for clothing management and bathing tasks. Pt is set up A for UB self care and min mod A for LB self care. Wife is available to assist at home. Patient will benefit from acute OT to increase overall independence in the areas of ADLs, functional mobility, and safety awareness in order to safely discharge home.      Recommendations for follow up therapy are one component of a multi-disciplinary discharge planning process, led by the attending physician.  Recommendations may be updated based on patient status, additional functional criteria and insurance authorization.   Follow Up Recommendations  Home health OT    Assistance Recommended at Discharge Intermittent Supervision/Assistance  Patient can return home with the following A lot of help with  bathing/dressing/bathroom;Help with stairs or ramp for entrance;Assist for transportation;A little help with walking and/or transfers    Functional Status Assessment  Patient has had a recent decline in their functional status and demonstrates the ability to make significant improvements in function in a reasonable and predictable amount of time.  Equipment Recommendations  Other (comment) (Bariatric RW and BSC)       Precautions / Restrictions Precautions Precautions: Fall Restrictions Weight Bearing Restrictions: Yes RLE Weight Bearing: Touchdown weight bearing      Mobility Bed Mobility               General bed mobility comments: seated in recliner chair    Transfers Overall transfer level: Needs assistance Equipment used: Rolling walker (2 wheels) Transfers: Sit to/from Stand Sit to Stand: Min assist                  Balance Overall balance assessment: Needs assistance, History of Falls Sitting-balance support: Bilateral upper extremity supported Sitting balance-Leahy Scale: Good     Standing balance support: During functional activity, Bilateral upper extremity supported, Reliant on assistive device for balance Standing balance-Leahy Scale: Poor                             ADL either performed or assessed with clinical judgement   ADL Overall ADL's : Needs assistance/impaired                                       General ADL Comments: Set up A for UB self  care. Pt has been educated on lateral leans to don LB clothing items and use of reacher.     Vision Patient Visual Report: No change from baseline              Pertinent Vitals/Pain Pain Assessment Pain Assessment: No/denies pain     Hand Dominance Right   Extremity/Trunk Assessment Upper Extremity Assessment Upper Extremity Assessment: Generalized weakness;Overall [facility] for tasks assessed   Lower Extremity Assessment Lower Extremity Assessment: Generalized  weakness;Overall [facility] for tasks assessed   Cervical / Trunk Assessment Cervical / Trunk Assessment: Normal   Communication     Cognition Arousal/Alertness: Awake/alert Behavior During Therapy: WFL for tasks assessed/performed Overall Cognitive Status: Within Functional Limits for tasks assessed                                 General Comments: Pt is A and O x 4. Pt is pleasant and cooperative                Home Living Family/patient expects to be discharged to:: Private residence Living Arrangements: Spouse/significant other Available Help at Discharge: Family;Available PRN/intermittently Type of Home: Mobile home Home Access: Stairs to enter Entrance Stairs-Number of Steps: 4 Entrance Stairs-Rails: Right;Left Home Layout: One level     Bathroom Shower/Tub: Walk-in shower         Home Equipment: Shower seat;Cane - single point;Rollator (4 wheels)   Additional Comments: larger rollator does not fit down hallway to bathroom; can sleep in lift chair reciner      Prior Functioning/Environment Prior Level of Function : Needs assist       Physical Assist : Mobility (physical);ADLs (physical) Mobility (physical): Gait;Stairs ADLs (physical): Bathing;Dressing;IADLs Mobility Comments: [facility], [facility] Comments: Pt reports mod I with ADLs and IADLs but is not very active outside of home at baseline and has needed assistance from wife since d/c from SNF        OT Problem List: Decreased strength;Pain;Decreased activity tolerance;Decreased safety awareness;Impaired balance (sitting and/or standing);Decreased knowledge of precautions;Decreased knowledge of use of DME or AE      OT Treatment/Interventions: Self-care/ADL training;Balance training;Therapeutic exercise;Energy conservation;Therapeutic activities;DME and/or AE instruction;Visual/perceptual  remediation/compensation;Patient/family education    OT Goals(Current goals can be found in the care plan section) Acute Rehab OT Goals Patient Stated Goal: to get stronger and go home OT Goal Formulation: With patient Time For Goal Achievement: 12/09/21 Potential to Achieve Goals: Good ADL Goals Pt Will Perform Grooming: with supervision Pt Will Transfer to Toilet: with supervision;bedside commode Pt Will Perform Toileting - Clothing Manipulation and hygiene: with min assist;sitting/lateral leans  OT Frequency: Min 2X/week       AM-PAC OT "6 Clicks" Daily Activity     Outcome Measure Help from another person eating meals?: None Help from another person taking care of personal grooming?: A Little Help from another person toileting, which includes using toliet, bedpan, or urinal?: A Lot Help from another person bathing (including washing, rinsing, drying)?: A Lot Help from another person to put on and taking off regular upper body clothing?: A Little Help from another person to put on and taking off regular lower body clothing?: A Lot 6 Click Score: 16   End of Session Equipment Utilized During Treatment: Rolling walker (2 wheels) Nurse Communication: Mobility status  Activity Tolerance: Patient tolerated treatment  well Patient left: with call bell/phone within reach;in chair;with chair alarm set  OT Visit Diagnosis: Unsteadiness on feet (R26.81);Muscle weakness (generalized) (M62.81)                Time: 0932-6712 OT Time Calculation (min): 25 min Charges:  OT General Charges $OT Visit: 1 Visit OT Evaluation $OT Eval Moderate Complexity: 1 Mod OT Treatments $Self Care/Home Management : 8-22 mins  Jackquline Denmark, MS, OTR/L , CBIS ascom [phone]  11/25/21, 4:08 PM

## 2021-11-25 NOTE — Evaluation (Signed)
Physical Therapy Evaluation Patient Details Name: Jared Carey MRN: [id number] DOB: [birth year] Today's Date: 11/25/2021  History of Present Illness  [name] is a 72UKG who comes to [facility] on 11/23/21 from [facility] after a syncopal episode in shower. Pt reports multiple syncopes this week. Pt was here 4 weeks ago s/p fall diagnosed with ankle fracture s/p ORIF of his right ankle on 10/29/2021 subsequent TDWB. PMH: divertociulosis, GAD, anemia, ADD, akathisia, obesity, thyroid disease, OAB, complete heart block s/p PPM.  Clinical Impression  Pt admitted with above diagnosis. Pt currently with functional limitations due to the deficits listed below (see "PT Problem List"). Upon entry, pt in bed, awake and agreeable to participate. The pt is alert, pleasant, interactive, and able to provide info regarding prior level of function, both in tolerance and independence. Moderate effort and supervision for safe supine to EOB; heavy minA and max effort to rise to standing with RW, but can do so from significant surface elevation >20". Pt able to show good RW use for sfe static balance (some dizziness) and stand pivot to recliner. Pt adheres to TDWB and performs some NWB with pivot to chair. Home environment is not optimized for pt's mobility needs at present, however his options are limited and his preference is to make it work. Pt has likely achieved most of his therapy goals with STR until he is permitted to use his Rt ankle and foot for weightbearing. Patient's performance this date reveals decreased ability, independence, and tolerance in performing all basic mobility required for performance of activities of daily living. Pt requires additional DME, close physical assistance, and cues for safe participate in mobility. Pt will benefit from skilled PT intervention to increase independence and safety with basic mobility in preparation for discharge to the venue listed below.           Recommendations for  follow up therapy are one component of a multi-disciplinary discharge planning process, led by the attending physician.  Recommendations may be updated based on patient status, additional functional criteria and insurance authorization.  Follow Up Recommendations Home health PT    Assistance Recommended at Discharge Intermittent Supervision/Assistance  Patient can return home with the following  A lot of help with bathing/dressing/bathroom;Assistance with cooking/housework;Help with stairs or ramp for entrance;Assist for transportation;A lot of help with walking and/or transfers    Equipment Recommendations Wheelchair (measurements PT);Wheelchair cushion (measurements PT);BSC/3in1 ([facility])  Recommendations for Other Services       Functional Status Assessment Patient has had a recent decline in their functional status and demonstrates the ability to make significant improvements in function in a reasonable and predictable amount of time.     Precautions / Restrictions Precautions Precautions: Fall Restrictions RLE Weight Bearing: Touchdown weight bearing      Mobility  Bed Mobility   Bed Mobility: Supine to Sit     Supine to sit: Min guard          Transfers   Equipment used: Rolling walker (2 wheels) Transfers: Sit to/from Stand Sit to Stand: Min assist                Ambulation/Gait Ambulation/Gait assistance:  (pt has not gotten back to AMB with hop step at facility yet, but today is dizzy.)                Stairs            Wheelchair Mobility    Modified Rankin (Stroke Patients Only)  Balance                                             Pertinent Vitals/Pain Pain Assessment Pain Assessment: No/denies pain    Home Living Family/patient expects to be discharged to:: Private residence Living Arrangements: Spouse/significant other Available Help at Discharge: Family;Available PRN/intermittently Type of  Home: Mobile home Home Access: Stairs to enter Entrance Stairs-Rails: Right;Left (wide porch) Entrance Stairs-Number of Steps: 4   Home Layout: One level Home Equipment: Shower seat;Cane - single point;Rollator (4 wheels) Additional Comments: larger rollator does not fit down hallway to bathroom; can sleep in lift chair reciner    Prior Function Prior Level of Function : Needs assist       Physical Assist : Mobility (physical);ADLs (physical) Mobility (physical): Gait;Stairs ADLs (physical): Bathing;Dressing;IADLs Mobility Comments: has been T[facility]B at [facility] for [facility] weeks, has been performing step pivot transfers adn slef propulsion in WC       Hand Dominance   Dominant Hand: Right    Extremity/Trunk Assessment   Upper Extremity Assessment Upper Extremity Assessment: Overall WFL for tasks assessed;Generalized weakness    Lower Extremity Assessment Lower Extremity Assessment: Generalized weakness;Overall [facility] for tasks assessed    Cervical / Trunk Assessment Cervical / Trunk Assessment: Normal  Communication      Cognition Arousal/Alertness: Awake/alert Behavior During Therapy: WFL for tasks assessed/performed Overall Cognitive Status: Within Functional Limits for tasks assessed                                          General Comments      Exercises Other Exercises Other Exercises: STS from elevated EOB, minGuard Assist, max effort. Good balance once up and standing.   Assessment/Plan    PT Assessment Patient needs continued PT services  PT Problem List Decreased strength;Decreased activity tolerance;Decreased balance;Decreased mobility;Decreased knowledge of use of DME;Decreased knowledge of precautions;Decreased range of motion       PT Treatment Interventions DME instruction;Gait training;Stair training;Functional mobility training;Therapeutic activities;Therapeutic exercise;Balance training;Patient/family education    PT Goals (Current goals  can be found in the Care Plan section)  Acute Rehab PT Goals Patient Stated Goal: return to home, eventually be able to use Rt foot for gait PT Goal Formulation: With patient Time For Goal Achievement: 12/09/21 Potential to Achieve Goals: Good    Frequency Min 2X/week     Co-evaluation               AM-PAC PT "6 Clicks" Mobility  Outcome Measure Help needed turning from your back to your side while in a flat bed without using bedrails?: A Little Help needed moving from lying on your back to sitting on the side of a flat bed without using bedrails?: A Little Help needed moving to and from a bed to a chair (including a wheelchair)?: A Lot Help needed standing up from a chair using your arms (e.g., wheelchair or bedside chair)?: A Lot Help needed to walk in hospital room?: Total Help needed climbing 3-5 steps with a railing? : Total 6 Click Score: 12    End of Session   Activity Tolerance: Patient tolerated treatment well;No increased pain Patient left: in chair;with call bell/phone within reach;with chair alarm set Nurse Communication: Mobility status PT Visit Diagnosis: History  of falling (Z91.81);Unsteadiness on feet (R26.81);Difficulty in walking, not elsewhere classified (R26.2);Muscle weakness (generalized) (M62.81);Repeated falls (R29.6);Dizziness and giddiness (R42)    Time: [id number] PT Time Calculation (min) (ACUTE ONLY): 37 min   Charges:   PT Evaluation $PT Eval High Complexity: 1 High PT Treatments $Gait Training: 8-22 mins       2:28 PM, 11/25/21 Rosamaria LintsAllan C Kensi Karr, PT, DPT Physical Therapist - [facility]  [phone] (ASCOM)     Lashonta Pilling C 11/25/2021, 2:24 PM

## 2021-11-25 NOTE — Assessment & Plan Note (Signed)
Continue venlafaxine, trazodone and Klonopin. 

## 2021-12-02 ENCOUNTER — Other Ambulatory Visit: Payer: Self-pay | Admitting: *Deleted

## 2021-12-02 NOTE — Patient Outreach (Signed)
[facility] Post- Acute Care Coordinator follow up. Member screened for potential [facility] services a benefit of [employer] plan.   Verified in [facility] that Mr. Lalor transitioned to home from [facility]. Lives with spouse. Has [facility]).  No identifiable [facility] needs.     [name], MSN, RN,BSN [facility] Post Acute Care Coordinator [phone] [facility]) [phone]  (Toll free office)

## 2021-12-04 DIAGNOSIS — Z9889 Other specified postprocedural states: Secondary | ICD-10-CM | POA: Diagnosis not present

## 2021-12-04 DIAGNOSIS — Z8781 Personal history of (healed) traumatic fracture: Secondary | ICD-10-CM | POA: Diagnosis not present

## 2021-12-23 DIAGNOSIS — Z950 Presence of cardiac pacemaker: Secondary | ICD-10-CM | POA: Diagnosis not present

## 2021-12-23 DIAGNOSIS — I495 Sick sinus syndrome: Secondary | ICD-10-CM | POA: Diagnosis not present

## 2021-12-25 DIAGNOSIS — S82891A Other fracture of right lower leg, initial encounter for closed fracture: Secondary | ICD-10-CM | POA: Diagnosis not present

## 2021-12-25 DIAGNOSIS — N179 Acute kidney failure, unspecified: Secondary | ICD-10-CM | POA: Diagnosis not present

## 2021-12-25 DIAGNOSIS — N3281 Overactive bladder: Secondary | ICD-10-CM | POA: Diagnosis not present

## 2021-12-25 DIAGNOSIS — R570 Cardiogenic shock: Secondary | ICD-10-CM | POA: Diagnosis not present

## 2021-12-25 DIAGNOSIS — Z86010 Personal history of colonic polyps: Secondary | ICD-10-CM | POA: Diagnosis not present

## 2021-12-28 DIAGNOSIS — Z9889 Other specified postprocedural states: Secondary | ICD-10-CM | POA: Diagnosis not present

## 2021-12-28 DIAGNOSIS — I7122 Aneurysm of the aortic arch, without rupture: Secondary | ICD-10-CM | POA: Diagnosis not present

## 2021-12-28 DIAGNOSIS — M1711 Unilateral primary osteoarthritis, right knee: Secondary | ICD-10-CM | POA: Diagnosis not present

## 2021-12-28 DIAGNOSIS — Z8781 Personal history of (healed) traumatic fracture: Secondary | ICD-10-CM | POA: Diagnosis not present

## 2022-01-20 DIAGNOSIS — M84471A Pathological fracture, right ankle, initial encounter for fracture: Secondary | ICD-10-CM | POA: Diagnosis not present

## 2022-01-20 DIAGNOSIS — M25571 Pain in right ankle and joints of right foot: Secondary | ICD-10-CM | POA: Diagnosis not present

## 2022-01-20 DIAGNOSIS — I495 Sick sinus syndrome: Secondary | ICD-10-CM | POA: Diagnosis not present

## 2022-01-20 DIAGNOSIS — R2681 Unsteadiness on feet: Secondary | ICD-10-CM | POA: Diagnosis not present

## 2022-01-25 DIAGNOSIS — N3281 Overactive bladder: Secondary | ICD-10-CM | POA: Diagnosis not present

## 2022-01-25 DIAGNOSIS — S82891A Other fracture of right lower leg, initial encounter for closed fracture: Secondary | ICD-10-CM | POA: Diagnosis not present

## 2022-01-25 DIAGNOSIS — N179 Acute kidney failure, unspecified: Secondary | ICD-10-CM | POA: Diagnosis not present

## 2022-01-25 DIAGNOSIS — Z86010 Personal history of colonic polyps: Secondary | ICD-10-CM | POA: Diagnosis not present

## 2022-01-25 DIAGNOSIS — R570 Cardiogenic shock: Secondary | ICD-10-CM | POA: Diagnosis not present

## 2022-01-27 DIAGNOSIS — M84471A Pathological fracture, right ankle, initial encounter for fracture: Secondary | ICD-10-CM | POA: Diagnosis not present

## 2022-01-27 DIAGNOSIS — R2681 Unsteadiness on feet: Secondary | ICD-10-CM | POA: Diagnosis not present

## 2022-01-27 DIAGNOSIS — M25571 Pain in right ankle and joints of right foot: Secondary | ICD-10-CM | POA: Diagnosis not present

## 2022-02-02 DIAGNOSIS — M84471A Pathological fracture, right ankle, initial encounter for fracture: Secondary | ICD-10-CM | POA: Diagnosis not present

## 2022-02-02 DIAGNOSIS — R2681 Unsteadiness on feet: Secondary | ICD-10-CM | POA: Diagnosis not present

## 2022-02-02 DIAGNOSIS — M25571 Pain in right ankle and joints of right foot: Secondary | ICD-10-CM | POA: Diagnosis not present

## 2022-02-04 DIAGNOSIS — R2681 Unsteadiness on feet: Secondary | ICD-10-CM | POA: Diagnosis not present

## 2022-02-04 DIAGNOSIS — M84471A Pathological fracture, right ankle, initial encounter for fracture: Secondary | ICD-10-CM | POA: Diagnosis not present

## 2022-02-04 DIAGNOSIS — M25571 Pain in right ankle and joints of right foot: Secondary | ICD-10-CM | POA: Diagnosis not present

## 2022-02-08 DIAGNOSIS — Z9889 Other specified postprocedural states: Secondary | ICD-10-CM | POA: Diagnosis not present

## 2022-02-08 DIAGNOSIS — M84371D Stress fracture, right ankle, subsequent encounter for fracture with routine healing: Secondary | ICD-10-CM | POA: Diagnosis not present

## 2022-02-08 DIAGNOSIS — Z8781 Personal history of (healed) traumatic fracture: Secondary | ICD-10-CM | POA: Diagnosis not present

## 2022-02-09 DIAGNOSIS — M25571 Pain in right ankle and joints of right foot: Secondary | ICD-10-CM | POA: Diagnosis not present

## 2022-02-09 DIAGNOSIS — M84471A Pathological fracture, right ankle, initial encounter for fracture: Secondary | ICD-10-CM | POA: Diagnosis not present

## 2022-02-09 DIAGNOSIS — R2681 Unsteadiness on feet: Secondary | ICD-10-CM | POA: Diagnosis not present

## 2022-02-16 DIAGNOSIS — M25571 Pain in right ankle and joints of right foot: Secondary | ICD-10-CM | POA: Diagnosis not present

## 2022-02-16 DIAGNOSIS — M84471A Pathological fracture, right ankle, initial encounter for fracture: Secondary | ICD-10-CM | POA: Diagnosis not present

## 2022-02-16 DIAGNOSIS — R2681 Unsteadiness on feet: Secondary | ICD-10-CM | POA: Diagnosis not present

## 2022-02-17 DIAGNOSIS — N179 Acute kidney failure, unspecified: Secondary | ICD-10-CM | POA: Diagnosis not present

## 2022-02-17 DIAGNOSIS — Z950 Presence of cardiac pacemaker: Secondary | ICD-10-CM | POA: Diagnosis not present

## 2022-02-17 DIAGNOSIS — K2101 Gastro-esophageal reflux disease with esophagitis, with bleeding: Secondary | ICD-10-CM | POA: Diagnosis not present

## 2022-02-17 DIAGNOSIS — E785 Hyperlipidemia, unspecified: Secondary | ICD-10-CM | POA: Diagnosis not present

## 2022-02-17 DIAGNOSIS — I10 Essential (primary) hypertension: Secondary | ICD-10-CM | POA: Diagnosis not present

## 2022-02-17 DIAGNOSIS — I441 Atrioventricular block, second degree: Secondary | ICD-10-CM | POA: Diagnosis not present

## 2022-02-17 DIAGNOSIS — I495 Sick sinus syndrome: Secondary | ICD-10-CM | POA: Diagnosis not present

## 2022-02-18 DIAGNOSIS — M84471A Pathological fracture, right ankle, initial encounter for fracture: Secondary | ICD-10-CM | POA: Diagnosis not present

## 2022-02-18 DIAGNOSIS — R2681 Unsteadiness on feet: Secondary | ICD-10-CM | POA: Diagnosis not present

## 2022-02-18 DIAGNOSIS — M25571 Pain in right ankle and joints of right foot: Secondary | ICD-10-CM | POA: Diagnosis not present

## 2022-02-23 DIAGNOSIS — M25571 Pain in right ankle and joints of right foot: Secondary | ICD-10-CM | POA: Diagnosis not present

## 2022-02-23 DIAGNOSIS — M84471A Pathological fracture, right ankle, initial encounter for fracture: Secondary | ICD-10-CM | POA: Diagnosis not present

## 2022-02-23 DIAGNOSIS — R2681 Unsteadiness on feet: Secondary | ICD-10-CM | POA: Diagnosis not present

## 2022-02-25 DIAGNOSIS — S82891A Other fracture of right lower leg, initial encounter for closed fracture: Secondary | ICD-10-CM | POA: Diagnosis not present

## 2022-02-25 DIAGNOSIS — R570 Cardiogenic shock: Secondary | ICD-10-CM | POA: Diagnosis not present

## 2022-02-25 DIAGNOSIS — N179 Acute kidney failure, unspecified: Secondary | ICD-10-CM | POA: Diagnosis not present

## 2022-02-25 DIAGNOSIS — N3281 Overactive bladder: Secondary | ICD-10-CM | POA: Diagnosis not present

## 2022-02-25 DIAGNOSIS — Z86010 Personal history of colonic polyps: Secondary | ICD-10-CM | POA: Diagnosis not present

## 2022-03-02 DIAGNOSIS — R2681 Unsteadiness on feet: Secondary | ICD-10-CM | POA: Diagnosis not present

## 2022-03-02 DIAGNOSIS — M25571 Pain in right ankle and joints of right foot: Secondary | ICD-10-CM | POA: Diagnosis not present

## 2022-03-02 DIAGNOSIS — M84471A Pathological fracture, right ankle, initial encounter for fracture: Secondary | ICD-10-CM | POA: Diagnosis not present

## 2022-03-04 DIAGNOSIS — M25571 Pain in right ankle and joints of right foot: Secondary | ICD-10-CM | POA: Diagnosis not present

## 2022-03-04 DIAGNOSIS — R2681 Unsteadiness on feet: Secondary | ICD-10-CM | POA: Diagnosis not present

## 2022-03-04 DIAGNOSIS — M84471A Pathological fracture, right ankle, initial encounter for fracture: Secondary | ICD-10-CM | POA: Diagnosis not present

## 2022-03-08 DIAGNOSIS — R2681 Unsteadiness on feet: Secondary | ICD-10-CM | POA: Diagnosis not present

## 2022-03-08 DIAGNOSIS — M25571 Pain in right ankle and joints of right foot: Secondary | ICD-10-CM | POA: Diagnosis not present

## 2022-03-08 DIAGNOSIS — M84471A Pathological fracture, right ankle, initial encounter for fracture: Secondary | ICD-10-CM | POA: Diagnosis not present

## 2022-03-12 DIAGNOSIS — M25571 Pain in right ankle and joints of right foot: Secondary | ICD-10-CM | POA: Diagnosis not present

## 2022-03-12 DIAGNOSIS — M84471A Pathological fracture, right ankle, initial encounter for fracture: Secondary | ICD-10-CM | POA: Diagnosis not present

## 2022-03-12 DIAGNOSIS — R2681 Unsteadiness on feet: Secondary | ICD-10-CM | POA: Diagnosis not present

## 2022-03-15 DIAGNOSIS — M84471A Pathological fracture, right ankle, initial encounter for fracture: Secondary | ICD-10-CM | POA: Diagnosis not present

## 2022-03-15 DIAGNOSIS — M25571 Pain in right ankle and joints of right foot: Secondary | ICD-10-CM | POA: Diagnosis not present

## 2022-03-15 DIAGNOSIS — R2681 Unsteadiness on feet: Secondary | ICD-10-CM | POA: Diagnosis not present

## 2022-03-24 DIAGNOSIS — Z8781 Personal history of (healed) traumatic fracture: Secondary | ICD-10-CM | POA: Diagnosis not present

## 2022-03-24 DIAGNOSIS — M25571 Pain in right ankle and joints of right foot: Secondary | ICD-10-CM | POA: Diagnosis not present

## 2022-03-24 DIAGNOSIS — R2681 Unsteadiness on feet: Secondary | ICD-10-CM | POA: Diagnosis not present

## 2022-03-24 DIAGNOSIS — Z9889 Other specified postprocedural states: Secondary | ICD-10-CM | POA: Diagnosis not present

## 2022-03-24 DIAGNOSIS — M84471A Pathological fracture, right ankle, initial encounter for fracture: Secondary | ICD-10-CM | POA: Diagnosis not present

## 2022-03-27 DIAGNOSIS — N3281 Overactive bladder: Secondary | ICD-10-CM | POA: Diagnosis not present

## 2022-03-27 DIAGNOSIS — Z86010 Personal history of colonic polyps: Secondary | ICD-10-CM | POA: Diagnosis not present

## 2022-03-27 DIAGNOSIS — S82891A Other fracture of right lower leg, initial encounter for closed fracture: Secondary | ICD-10-CM | POA: Diagnosis not present

## 2022-03-27 DIAGNOSIS — N179 Acute kidney failure, unspecified: Secondary | ICD-10-CM | POA: Diagnosis not present

## 2022-03-27 DIAGNOSIS — R570 Cardiogenic shock: Secondary | ICD-10-CM | POA: Diagnosis not present

## 2022-03-31 DIAGNOSIS — M84471A Pathological fracture, right ankle, initial encounter for fracture: Secondary | ICD-10-CM | POA: Diagnosis not present

## 2022-03-31 DIAGNOSIS — M25571 Pain in right ankle and joints of right foot: Secondary | ICD-10-CM | POA: Diagnosis not present

## 2022-03-31 DIAGNOSIS — R2681 Unsteadiness on feet: Secondary | ICD-10-CM | POA: Diagnosis not present

## 2022-04-07 DIAGNOSIS — M25571 Pain in right ankle and joints of right foot: Secondary | ICD-10-CM | POA: Diagnosis not present

## 2022-04-07 DIAGNOSIS — R2681 Unsteadiness on feet: Secondary | ICD-10-CM | POA: Diagnosis not present

## 2022-04-07 DIAGNOSIS — M84471A Pathological fracture, right ankle, initial encounter for fracture: Secondary | ICD-10-CM | POA: Diagnosis not present

## 2022-04-14 DIAGNOSIS — M84471A Pathological fracture, right ankle, initial encounter for fracture: Secondary | ICD-10-CM | POA: Diagnosis not present

## 2022-04-14 DIAGNOSIS — M25571 Pain in right ankle and joints of right foot: Secondary | ICD-10-CM | POA: Diagnosis not present

## 2022-04-14 DIAGNOSIS — R2681 Unsteadiness on feet: Secondary | ICD-10-CM | POA: Diagnosis not present

## 2022-04-21 DIAGNOSIS — R2681 Unsteadiness on feet: Secondary | ICD-10-CM | POA: Diagnosis not present

## 2022-04-21 DIAGNOSIS — M84471S Pathological fracture, right ankle, sequela: Secondary | ICD-10-CM | POA: Diagnosis not present

## 2022-04-21 DIAGNOSIS — M25571 Pain in right ankle and joints of right foot: Secondary | ICD-10-CM | POA: Diagnosis not present

## 2022-04-27 DIAGNOSIS — N179 Acute kidney failure, unspecified: Secondary | ICD-10-CM | POA: Diagnosis not present

## 2022-04-27 DIAGNOSIS — Z86010 Personal history of colonic polyps: Secondary | ICD-10-CM | POA: Diagnosis not present

## 2022-04-27 DIAGNOSIS — S82891A Other fracture of right lower leg, initial encounter for closed fracture: Secondary | ICD-10-CM | POA: Diagnosis not present

## 2022-04-27 DIAGNOSIS — N3281 Overactive bladder: Secondary | ICD-10-CM | POA: Diagnosis not present

## 2022-04-27 DIAGNOSIS — R570 Cardiogenic shock: Secondary | ICD-10-CM | POA: Diagnosis not present

## 2022-04-28 DIAGNOSIS — M25571 Pain in right ankle and joints of right foot: Secondary | ICD-10-CM | POA: Diagnosis not present

## 2022-04-28 DIAGNOSIS — R2681 Unsteadiness on feet: Secondary | ICD-10-CM | POA: Diagnosis not present

## 2022-04-28 DIAGNOSIS — M84471S Pathological fracture, right ankle, sequela: Secondary | ICD-10-CM | POA: Diagnosis not present

## 2022-05-05 DIAGNOSIS — M84471S Pathological fracture, right ankle, sequela: Secondary | ICD-10-CM | POA: Diagnosis not present

## 2022-05-05 DIAGNOSIS — R2681 Unsteadiness on feet: Secondary | ICD-10-CM | POA: Diagnosis not present

## 2022-05-05 DIAGNOSIS — M25571 Pain in right ankle and joints of right foot: Secondary | ICD-10-CM | POA: Diagnosis not present

## 2022-05-07 ENCOUNTER — Other Ambulatory Visit: Payer: Self-pay | Admitting: Diagnostic Neuroimaging

## 2022-05-12 DIAGNOSIS — R2681 Unsteadiness on feet: Secondary | ICD-10-CM | POA: Diagnosis not present

## 2022-05-12 DIAGNOSIS — M25571 Pain in right ankle and joints of right foot: Secondary | ICD-10-CM | POA: Diagnosis not present

## 2022-05-12 DIAGNOSIS — M84471S Pathological fracture, right ankle, sequela: Secondary | ICD-10-CM | POA: Diagnosis not present

## 2022-05-26 DIAGNOSIS — M84471S Pathological fracture, right ankle, sequela: Secondary | ICD-10-CM | POA: Diagnosis not present

## 2022-05-26 DIAGNOSIS — M25571 Pain in right ankle and joints of right foot: Secondary | ICD-10-CM | POA: Diagnosis not present

## 2022-05-26 DIAGNOSIS — R2681 Unsteadiness on feet: Secondary | ICD-10-CM | POA: Diagnosis not present

## 2022-05-27 DIAGNOSIS — N3281 Overactive bladder: Secondary | ICD-10-CM | POA: Diagnosis not present

## 2022-05-27 DIAGNOSIS — N179 Acute kidney failure, unspecified: Secondary | ICD-10-CM | POA: Diagnosis not present

## 2022-05-27 DIAGNOSIS — Z86010 Personal history of colonic polyps: Secondary | ICD-10-CM | POA: Diagnosis not present

## 2022-05-27 DIAGNOSIS — S82891A Other fracture of right lower leg, initial encounter for closed fracture: Secondary | ICD-10-CM | POA: Diagnosis not present

## 2022-05-27 DIAGNOSIS — R570 Cardiogenic shock: Secondary | ICD-10-CM | POA: Diagnosis not present

## 2022-06-27 DIAGNOSIS — Z86010 Personal history of colonic polyps: Secondary | ICD-10-CM | POA: Diagnosis not present

## 2022-06-27 DIAGNOSIS — N179 Acute kidney failure, unspecified: Secondary | ICD-10-CM | POA: Diagnosis not present

## 2022-06-27 DIAGNOSIS — N3281 Overactive bladder: Secondary | ICD-10-CM | POA: Diagnosis not present

## 2022-06-27 DIAGNOSIS — R570 Cardiogenic shock: Secondary | ICD-10-CM | POA: Diagnosis not present

## 2022-06-27 DIAGNOSIS — S82891A Other fracture of right lower leg, initial encounter for closed fracture: Secondary | ICD-10-CM | POA: Diagnosis not present

## 2022-07-28 DIAGNOSIS — R570 Cardiogenic shock: Secondary | ICD-10-CM | POA: Diagnosis not present

## 2022-07-28 DIAGNOSIS — Z86010 Personal history of colonic polyps: Secondary | ICD-10-CM | POA: Diagnosis not present

## 2022-07-28 DIAGNOSIS — S82891A Other fracture of right lower leg, initial encounter for closed fracture: Secondary | ICD-10-CM | POA: Diagnosis not present

## 2022-07-28 DIAGNOSIS — N179 Acute kidney failure, unspecified: Secondary | ICD-10-CM | POA: Diagnosis not present

## 2022-07-28 DIAGNOSIS — N3281 Overactive bladder: Secondary | ICD-10-CM | POA: Diagnosis not present

## 2022-08-03 ENCOUNTER — Telehealth: Payer: Self-pay | Admitting: Diagnostic Neuroimaging

## 2022-08-03 NOTE — Telephone Encounter (Signed)
R/s 09/13/22 appt- MD out.

## 2022-08-27 DIAGNOSIS — I872 Venous insufficiency (chronic) (peripheral): Secondary | ICD-10-CM | POA: Diagnosis not present

## 2022-09-13 ENCOUNTER — Telehealth: Payer: Medicare Other | Admitting: Diagnostic Neuroimaging

## 2022-10-04 ENCOUNTER — Telehealth: Payer: Medicare Other | Admitting: Diagnostic Neuroimaging

## 2023-06-02 DIAGNOSIS — I890 Lymphedema, not elsewhere classified: Secondary | ICD-10-CM | POA: Diagnosis not present

## 2023-06-02 DIAGNOSIS — I872 Venous insufficiency (chronic) (peripheral): Secondary | ICD-10-CM | POA: Diagnosis not present

## 2023-06-02 DIAGNOSIS — L0390 Cellulitis, unspecified: Secondary | ICD-10-CM | POA: Diagnosis not present

## 2023-06-07 DIAGNOSIS — L0390 Cellulitis, unspecified: Secondary | ICD-10-CM | POA: Diagnosis not present

## 2023-06-16 DIAGNOSIS — L0390 Cellulitis, unspecified: Secondary | ICD-10-CM | POA: Diagnosis not present

## 2024-01-27 ENCOUNTER — Other Ambulatory Visit: Payer: Self-pay

## 2024-01-27 DIAGNOSIS — R911 Solitary pulmonary nodule: Secondary | ICD-10-CM

## 2024-02-06 ENCOUNTER — Ambulatory Visit
Admission: RE | Admit: 2024-02-06 | Discharge: 2024-02-06 | Disposition: A | Discharge status: Home / Self Care | Source: Ambulatory Visit

## 2024-02-06 DIAGNOSIS — R911 Solitary pulmonary nodule: Secondary | ICD-10-CM | POA: Insufficient documentation

## 2024-03-07 ENCOUNTER — Encounter: Payer: Self-pay | Admitting: Nurse Practitioner

## 2024-03-15 ENCOUNTER — Other Ambulatory Visit (HOSPITAL_COMMUNITY): Payer: Self-pay | Admitting: Nurse Practitioner

## 2024-03-15 DIAGNOSIS — I5021 Acute systolic (congestive) heart failure: Secondary | ICD-10-CM

## 2024-04-03 ENCOUNTER — Telehealth (HOSPITAL_COMMUNITY): Payer: Self-pay | Admitting: Emergency Medicine

## 2024-04-03 NOTE — Telephone Encounter (Signed)
 Reaching out to patient to offer assistance regarding upcoming cardiac imaging study; pt verbalizes understanding of appt date/time, parking situation and where to check in, pre-test NPO status and medications ordered, and verified current allergies; name and call back number provided for further questions should they arise Camie Shutter RN Navigator Cardiac Imaging [facility] [phone] office [phone] cell   PPM set to 60bpm 180mg  diltazem

## 2024-04-05 ENCOUNTER — Ambulatory Visit
Admission: RE | Admit: 2024-04-05 | Discharge: 2024-04-05 | Disposition: A | Discharge status: Home / Self Care | Source: Ambulatory Visit | Attending: Nurse Practitioner | Admitting: Nurse Practitioner

## 2024-04-05 DIAGNOSIS — I5021 Acute systolic (congestive) heart failure: Secondary | ICD-10-CM | POA: Insufficient documentation

## 2024-04-05 LAB — POCT I-STAT CREATININE: Creatinine, Ser: 1.3 mg/dL — ABNORMAL HIGH (ref 0.61–1.24)

## 2024-04-05 MED ORDER — DILTIAZEM HCL 25 MG/5ML IV SOLN: 10.0000 mg | INTRAVENOUS | Status: DC | PRN

## 2024-04-05 MED ORDER — METOPROLOL TARTRATE 5 MG/5ML IV SOLN: 10.0000 mg | INTRAVENOUS | Status: DC | PRN

## 2024-04-05 MED ORDER — NITROGLYCERIN 0.4 MG SL SUBL
0.8000 mg | SUBLINGUAL_TABLET | Freq: Once | SUBLINGUAL | Status: AC
  Administered 2024-04-05: 0.8 mg via SUBLINGUAL

## 2024-04-05 MED ORDER — IOHEXOL 350 MG/ML SOLN
100.0000 mL | Freq: Once | INTRAVENOUS | Status: AC | PRN
Start: 2024-04-05 — End: 2024-04-05
  Administered 2024-04-05: 100 mL via INTRAVENOUS

## 2024-04-05 NOTE — Progress Notes (Signed)
 Patient tolerated CT well. Vitals stable. Encouraged to drink fluids throughout day.
